# Patient Record
Sex: Male | Born: 1955 | Race: White | Hispanic: Yes | Marital: Married | Smoking: Current every day smoker
Health system: Southern US, Community
[De-identification: ages and names within clinical notes are randomized; demographics above are authoritative.]

## PROBLEM LIST (undated history)

## (undated) DIAGNOSIS — I10 Essential (primary) hypertension: Secondary | ICD-10-CM

## (undated) HISTORY — PX: OTHER SURGICAL HISTORY: SHX169

## (undated) HISTORY — PX: HEMORRHOID SURGERY: SHX153

---

## 2004-10-27 ENCOUNTER — Ambulatory Visit: Payer: Self-pay | Admitting: Internal Medicine

## 2004-10-28 ENCOUNTER — Ambulatory Visit: Payer: Self-pay | Admitting: Internal Medicine

## 2004-10-28 ENCOUNTER — Ambulatory Visit: Payer: Self-pay | Admitting: *Deleted

## 2005-01-30 ENCOUNTER — Ambulatory Visit: Payer: Self-pay | Admitting: Internal Medicine

## 2005-02-09 ENCOUNTER — Ambulatory Visit: Payer: Self-pay | Admitting: Internal Medicine

## 2005-04-14 ENCOUNTER — Ambulatory Visit: Payer: Self-pay | Admitting: Internal Medicine

## 2005-04-27 ENCOUNTER — Encounter: Admission: RE | Admit: 2005-04-27 | Discharge: 2005-04-27 | Payer: Self-pay | Admitting: Specialist

## 2005-06-29 ENCOUNTER — Ambulatory Visit: Payer: Self-pay | Admitting: Internal Medicine

## 2005-09-17 ENCOUNTER — Ambulatory Visit: Payer: Self-pay | Admitting: Internal Medicine

## 2005-09-23 ENCOUNTER — Ambulatory Visit: Payer: Self-pay | Admitting: Internal Medicine

## 2005-10-07 ENCOUNTER — Ambulatory Visit: Payer: Self-pay | Admitting: Internal Medicine

## 2008-01-04 ENCOUNTER — Ambulatory Visit: Payer: Self-pay | Admitting: Nurse Practitioner

## 2008-01-04 DIAGNOSIS — N400 Enlarged prostate without lower urinary tract symptoms: Secondary | ICD-10-CM

## 2008-01-04 DIAGNOSIS — I10 Essential (primary) hypertension: Secondary | ICD-10-CM

## 2008-01-04 DIAGNOSIS — F17200 Nicotine dependence, unspecified, uncomplicated: Secondary | ICD-10-CM | POA: Insufficient documentation

## 2008-01-04 LAB — CONVERTED CEMR LAB
Bilirubin Urine: NEGATIVE
Ketones, urine, test strip: NEGATIVE
Specific Gravity, Urine: 1.025
WBC Urine, dipstick: NEGATIVE

## 2008-01-05 ENCOUNTER — Encounter (INDEPENDENT_AMBULATORY_CARE_PROVIDER_SITE_OTHER): Payer: Self-pay | Admitting: Nurse Practitioner

## 2008-01-05 LAB — CONVERTED CEMR LAB
AST: 18 units/L (ref 0–37)
Albumin: 4.4 g/dL (ref 3.5–5.2)
BUN: 15 mg/dL (ref 6–23)
CO2: 24 meq/L (ref 19–32)
Eosinophils Relative: 2 % (ref 0–5)
Glucose, Bld: 85 mg/dL (ref 70–99)
HCT: 43.9 % (ref 39.0–52.0)
Lymphocytes Relative: 30 % (ref 12–46)
Lymphs Abs: 2.5 10*3/uL (ref 0.7–4.0)
Monocytes Absolute: 0.7 10*3/uL (ref 0.1–1.0)
Neutrophils Relative %: 60 % (ref 43–77)
PSA: 4.7 ng/mL — ABNORMAL HIGH (ref 0.10–4.00)
Potassium: 4.7 meq/L (ref 3.5–5.3)
RDW: 17.2 % — ABNORMAL HIGH (ref 11.5–15.5)
Sodium: 141 meq/L (ref 135–145)
TSH: 1.198 microintl units/mL (ref 0.350–5.50)
Total Protein: 7.2 g/dL (ref 6.0–8.3)
WBC: 8.6 10*3/uL (ref 4.0–10.5)

## 2008-02-01 ENCOUNTER — Encounter (INDEPENDENT_AMBULATORY_CARE_PROVIDER_SITE_OTHER): Payer: Self-pay | Admitting: Internal Medicine

## 2008-02-01 ENCOUNTER — Ambulatory Visit: Payer: Self-pay | Admitting: Nurse Practitioner

## 2008-02-01 LAB — CONVERTED CEMR LAB
Basophils Absolute: 0 10*3/uL (ref 0.0–0.1)
Basophils Relative: 0 % (ref 0–1)
Eosinophils Absolute: 0.3 10*3/uL (ref 0.0–0.7)
Eosinophils Relative: 3 % (ref 0–5)
Hemoglobin: 14.1 g/dL (ref 13.0–17.0)
MCHC: 32.2 g/dL (ref 30.0–36.0)
MCV: 83 fL (ref 78.0–100.0)

## 2008-02-09 ENCOUNTER — Emergency Department (HOSPITAL_COMMUNITY): Admission: EM | Admit: 2008-02-09 | Discharge: 2008-02-09 | Payer: Self-pay | Admitting: Emergency Medicine

## 2008-03-12 ENCOUNTER — Ambulatory Visit: Payer: Self-pay | Admitting: Nurse Practitioner

## 2008-03-13 ENCOUNTER — Encounter (INDEPENDENT_AMBULATORY_CARE_PROVIDER_SITE_OTHER): Payer: Self-pay | Admitting: Nurse Practitioner

## 2008-03-23 ENCOUNTER — Ambulatory Visit: Payer: Self-pay | Admitting: Nurse Practitioner

## 2008-03-23 DIAGNOSIS — N200 Calculus of kidney: Secondary | ICD-10-CM

## 2008-03-23 DIAGNOSIS — K6289 Other specified diseases of anus and rectum: Secondary | ICD-10-CM

## 2008-03-23 LAB — CONVERTED CEMR LAB
Glucose, Urine, Semiquant: NEGATIVE
Ketones, urine, test strip: NEGATIVE
Nitrite: NEGATIVE

## 2008-07-19 ENCOUNTER — Emergency Department (HOSPITAL_COMMUNITY): Admission: EM | Admit: 2008-07-19 | Discharge: 2008-07-19 | Payer: Self-pay | Admitting: Emergency Medicine

## 2010-08-01 ENCOUNTER — Ambulatory Visit: Payer: Self-pay | Admitting: Internal Medicine

## 2010-08-01 ENCOUNTER — Encounter (INDEPENDENT_AMBULATORY_CARE_PROVIDER_SITE_OTHER): Payer: Self-pay | Admitting: Nurse Practitioner

## 2010-08-01 DIAGNOSIS — E7800 Pure hypercholesterolemia, unspecified: Secondary | ICD-10-CM

## 2010-08-01 DIAGNOSIS — J309 Allergic rhinitis, unspecified: Secondary | ICD-10-CM | POA: Insufficient documentation

## 2010-08-01 DIAGNOSIS — D179 Benign lipomatous neoplasm, unspecified: Secondary | ICD-10-CM | POA: Insufficient documentation

## 2010-08-01 DIAGNOSIS — IMO0001 Reserved for inherently not codable concepts without codable children: Secondary | ICD-10-CM

## 2010-08-01 LAB — CONVERTED CEMR LAB
ALT: 14 units/L (ref 0–53)
Alkaline Phosphatase: 55 units/L (ref 39–117)
Calcium: 9.9 mg/dL (ref 8.4–10.5)
Chloride: 104 meq/L (ref 96–112)
Creatinine, Ser: 0.87 mg/dL (ref 0.40–1.50)
Eosinophils Absolute: 0.3 10*3/uL (ref 0.0–0.7)
Glucose, Bld: 81 mg/dL (ref 70–99)
Hemoglobin: 14.2 g/dL (ref 13.0–17.0)
LDL Goal: 130 mg/dL
Lymphocytes Relative: 37 % (ref 12–46)
Monocytes Absolute: 0.7 10*3/uL (ref 0.1–1.0)
Neutro Abs: 3.1 10*3/uL (ref 1.7–7.7)
Neutrophils Relative %: 47 % (ref 43–77)
Potassium: 4.4 meq/L (ref 3.5–5.3)
RBC: 5.27 M/uL (ref 4.22–5.81)
RDW: 16.6 % — ABNORMAL HIGH (ref 11.5–15.5)
Sodium: 139 meq/L (ref 135–145)
Total Bilirubin: 0.6 mg/dL (ref 0.3–1.2)
WBC: 6.6 10*3/uL (ref 4.0–10.5)

## 2010-08-04 ENCOUNTER — Encounter (INDEPENDENT_AMBULATORY_CARE_PROVIDER_SITE_OTHER): Payer: Self-pay | Admitting: Nurse Practitioner

## 2010-08-06 ENCOUNTER — Ambulatory Visit: Payer: Self-pay | Admitting: Nurse Practitioner

## 2010-08-07 LAB — CONVERTED CEMR LAB: Triglycerides: 216 mg/dL — ABNORMAL HIGH (ref <150)

## 2010-08-15 ENCOUNTER — Telehealth (INDEPENDENT_AMBULATORY_CARE_PROVIDER_SITE_OTHER): Payer: Self-pay | Admitting: Nurse Practitioner

## 2010-08-19 ENCOUNTER — Telehealth (INDEPENDENT_AMBULATORY_CARE_PROVIDER_SITE_OTHER): Payer: Self-pay | Admitting: Nurse Practitioner

## 2010-09-04 ENCOUNTER — Telehealth (INDEPENDENT_AMBULATORY_CARE_PROVIDER_SITE_OTHER): Payer: Self-pay | Admitting: Nurse Practitioner

## 2010-12-09 NOTE — Progress Notes (Signed)
Summary: LOVASTATIN Rx  Phone Note Call from Patient   Summary of Call: SPOKE WITH PT AND HE CAME TO THE OFFICE THIS AFTERNOON BECAUSE HE WAS TOLD LOVASTATIN 20 MG WAS SENT TO THE [place] [facility].  HE HAS WENT TO PICK UP MEDICATION AND WAS TOLD THERE WAS NOT RX THERE FOR HIM.  I CALLED TO THE PHARMACY AND THEY SAID THAT THEY DO NOT HAVE AN RX FOR HIM FOR THE LOVASTATIN AND THAT THEY DON'T HAVE THAT IF HE WAS TO GET THE RX FOR LOVASTATIN THEN THEY WOULD GIVE HIM PRAVASTIN.  I TOLD PT TO CALL TO THE OFFICE IN THE MORNING SO THAT WE CAN GET HIS MEDICATION FOR HIM SENT TO THE PHARMACY OR HE CAN COME BY AND PICK UP MEDICATION. Initial call taken by: Levon Hedger,  August 19, 2010 4:46 PM  Follow-up for Phone Call        Sorry for the confusion - note from 08/15/2010 indicated that rx was sent to [facility] pharmacy i have sent pravastatin 20mg  by mouth nightly to the [facility] pharmacy electronically. Advise pt to check there by Friday again apologize for the confusion.  I appreciate his willingness to be complaint Follow-up by: Lehman Prom FNP,  August 19, 2010 6:03 PM  Additional Follow-up for Phone Call Additional follow up Details #1::        pt informed of above information. Additional Follow-up by: Levon Hedger,  August 25, 2010 4:05 PM    New/Updated Medications: PRAVASTATIN SODIUM 20 MG TABS (PRAVASTATIN SODIUM) One tablet by mouth nightly for cholesterol Prescriptions: PRAVASTATIN SODIUM 20 MG TABS (PRAVASTATIN SODIUM) One tablet by mouth nightly for cholesterol  #30 x 5   Entered and Authorized by:   Lehman Prom FNP   Signed by:   Lehman Prom FNP on 08/19/2010   Method used:   Faxed to ...       [facility] (retail)       [address] [place], [place]  [address]       Ph: [phone]       Fax: [phone]   RxID:   [id number]

## 2010-12-09 NOTE — Assessment & Plan Note (Signed)
Summary: HTN   Vital Signs:  Patient profile:   55 year old male Height:      70 inches Weight:      214.0 pounds BMI:     30.82 Temp:     97.4 degrees F oral Pulse rate:   66 / minute Pulse rhythm:   regular Resp:     16 per minute BP sitting:   124 / 84  (left arm) Cuff size:   large  Vitals Entered By: Levon Hedger (August 01, 2010 11:46 AM)  Nutrition Counseling: Patient's BMI is greater than 25 and therefore counseled on weight management options. CC: wants to have cholesterol checked...pain in muscle and joints alot....allergies are very active right now, Hypertension Management, Lipid Management Is Patient Diabetic? No Pain Assessment Patient in pain? no       Does patient need assistance? Functional Status Self care Ambulation Normal Comments pt has eaten this morning.   CC:  wants to have cholesterol checked...pain in muscle and joints alot....allergies are very active right now, Hypertension Management, and Lipid Management.  History of Present Illness:  Pt into the office for f/u - last visit over 1 year ago. Pt has been in [place] for the past 6 months  Hypertension History:      He denies headache, chest pain, and palpitations.  He notes no problems with any antihypertensive medication side effects.  Pt has been taking losartan which he got from his country.        Positive major cardiovascular risk factors include male age 23 years old or older, hyperlipidemia, and hypertension.  Negative major cardiovascular risk factors include no history of diabetes and non-tobacco-user status.        Further assessment for target organ damage reveals no history of ASHD, cardiac end-organ damage (CHF/LVH), stroke/TIA, peripheral vascular disease, renal insufficiency, or hypertensive retinopathy.    Lipid Management History:      Positive NCEP/ATP III risk factors include male age 71 years old or older and hypertension.  Negative NCEP/ATP III risk factors  include non-diabetic, non-tobacco-user status, no ASHD (atherosclerotic heart disease), no prior stroke/TIA, and no peripheral vascular disease.        Comments include: Pt was given both simvastatin and lovastatin in [place]. Simvastatin 10mg  was not effective and then he was changed to lovastatin.      Habits & Providers  Alcohol-Tobacco-Diet     Alcohol drinks/day: 0     Tobacco Status: quit     Year Quit: march 2009  Exercise-Depression-Behavior     Have you felt down or hopeless? no     Have you felt little pleasure in things? no     Depression Counseling: not indicated; screening negative for depression     Drug Use: no     Seat Belt Use: 100     Sun Exposure: occasionally  Allergies (verified): No Known Drug Allergies  Review of Systems General:  Denies fever. ENT:  Complains of nasal congestion; denies earache and sore throat; Pt has tried afrin but it is not effective.. CV:  Denies chest pain or discomfort. Resp:  Denies cough. 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 Allergy:  Complains of seasonal allergies and sneezing.  Physical Exam  General:  alert.   Head:  normocephalic.   Lungs:  normal breath sounds.   Heart:  normal rate and  regular rhythm.   Abdomen:  normal bowel sounds.   Msk:  up to the exam table Neurologic:  alert & oriented X3.     Impression & Recommendations:  Problem # 1:  HYPERTENSION, BENIGN ESSENTIAL (ICD-401.1)  BP is doing better The following medications were removed from the medication list:    Lisinopril 40 Mg Tabs (Lisinopril) .Marland Kitchen... 1 tablet by mouth daily for blood pressure His updated medication list for this problem includes:    Losartan Potassium 100 Mg Tabs (Losartan potassium) ..... One tablet by mouth daily for blood pressure  Orders: T-Comprehensive Metabolic Panel ([id number]) T-CBC w/Diff ([id number]) T-TSH  ([id number])  Problem # 2:  MUSCLE PAIN (ICD-729.1)  His updated medication list for this problem includes:    Tramadol Hcl 50 Mg Tabs (Tramadol hcl) ..... One tablet by mouth two times a day as needed for pain  Problem # 3:  HYPERCHOLESTEROLEMIA (ICD-272.0) pt to return for labs Orders: T-Comprehensive Metabolic Panel ([id number])  Problem # 4:  ALLERGIC RHINITIS (ICD-477.9)  His updated medication list for this problem includes:    Veramyst 27.5 Mcg/spray Susp (Fluticasone furoate) ..... One spray in each nostril two times a day  Complete Medication List: 1)  Losartan Potassium 100 Mg Tabs (Losartan potassium) .... One tablet by mouth daily for blood pressure 2)  Tramadol Hcl 50 Mg Tabs (Tramadol hcl) .... One tablet by mouth two times a day as needed for pain 3)  Veramyst 27.5 Mcg/spray Susp (Fluticasone furoate) .... One spray in each nostril two times a day  Hypertension Assessment/Plan:      The patient's hypertensive risk group is category B: At least one risk factor (excluding diabetes) with no target organ damage.  Today's blood pressure is 124/84.  His blood pressure goal is < 140/90.  Lipid Assessment/Plan:      Based on NCEP/ATP III, the patient's risk factor category is "2 or more risk factors and a calculated 10 year CAD risk of > 20%".  The patient's lipid goals are as follows: Total cholesterol goal is 200; LDL cholesterol goal is 130; HDL cholesterol goal is 40; Triglyceride goal is 150.    Patient Instructions: 1)  Schedule a lab visit for next visit - lipids 2)  do not eat after midnight before this visit. 3)  Use the nose spray for congestion.  Spray in each nostril two times a day (hold head down) Prescriptions: VERAMYST 27.5 MCG/SPRAY SUSP (FLUTICASONE FUROATE) one spray in each nostril two times a day  #1 x 0   Entered and Authorized by:   Lehman Prom FNP   Signed by:   Lehman Prom FNP on 08/01/2010   Method used:   Samples Given   RxID:    [id number] TRAMADOL HCL 50 MG TABS (TRAMADOL HCL) One tablet by mouth two times a day as needed for pain  #50 x 0   Entered and Authorized by:   Lehman Prom FNP   Signed by:   Lehman Prom FNP on 08/01/2010   Method used:   Print then Give to Patient   RxID:   [id number] NATFTDDU POTASSIUM 100 MG TABS (LOSARTAN POTASSIUM) One tablet by mouth daily for blood pressure  #30 x 5   Entered and Authorized by:   Lehman Prom FNP   Signed by:   Lehman Prom FNP on 08/01/2010   Method used:   Print then Give to Patient   RxID:   [id number]

## 2010-12-09 NOTE — Progress Notes (Signed)
Summary: Wants change of medication  Phone Note Call from Patient Call back at [phone]   Summary of Call: PATIENT WANT TO CHANGE MEDICINE FOR CHOLESTEROL, MEDICINE MAKING HIM FEEL TIRED WEAK. Initial call taken by: Domenic Polite,  September 04, 2010 11:57 AM  Follow-up for Phone Call        Doesn't feel well taking this.  Has been feeling muscle pain, fatigued, weak for about eight days.  Also having some stomach discomfort.   Taking med every night with food.  Denies any other symptoms.  Dutch Quint RN  September 04, 2010 2:24 PM   Additional Follow-up for Phone Call Additional follow up Details #1::        Taking WITH food is the problem he needs to take this cholesterol medicaton 2 hours AFTER eating Advise pt to STOP med for 5 days THEN restart at night with above directions advise him to take just before bedtime Additional Follow-up by: Lehman Prom FNP,  September 04, 2010 3:35 PM    Additional Follow-up for Phone Call Additional follow up Details #2::    Pt. advised of provider's instructions -- verbalized understanding and agreement.  Dutch Quint RN  September 04, 2010 4:13 PM

## 2010-12-09 NOTE — Letter (Signed)
Summary: *HSN Results Follow up  [facility]  [address] [place], [place] [address]   Phone: [phone]  Fax: [phone]      08/04/2010   Danny Murillo [address] [place], [place]  [address]   Dear  [name],                            ____S.Drinkard,FNP   ____D. Gore,FNP       ____B. McPherson,MD   ____V. Rankins,MD    ____E. Mulberry,MD    __X__N. Daphine Deutscher, FNP  ____D. Reche Dixon, MD    ____K. Philipp Deputy, MD    ____Other     This letter is to inform you that your recent test(s):  _______Pap Smear    ___X____Lab Test     _______X-ray    ___X____ is within acceptable limits  _______ requires a medication change  _______ requires a follow-up lab visit  _______ requires a follow-up visit with your provider   Comments:  Labs done during recent office visit are normal.       _________________________________________________________ If you have any questions, please contact our office [phone].                    Sincerely,   Lehman Prom FNP [facility]

## 2010-12-09 NOTE — Letter (Signed)
Summary: Handout Printed  Printed Handout:  - Lipoma (Fatty Tumor) 

## 2010-12-09 NOTE — Progress Notes (Signed)
Summary: RX FOR CHOLESTEROL  Phone Note Call from Patient Call back at [phone]   Summary of Call: PT NEED HES RX FOR CHOLESTEROL HE WLL CAME AND PICK UP THE RX Initial call taken by: Domenic Polite,  August 15, 2010 10:40 AM  Follow-up for Phone Call        I don't see a Rx for him in the drawer.   Dutch Quint RN  August 15, 2010 11:11 AM   Additional Follow-up for Phone Call Additional follow up Details #1::        see previous phone note - looks like he was contacted about RX if he wants it to go to another pharmacy then find out where and call it in there according to recent Rx on 08/07/2010 Additional Follow-up by: Lehman Prom FNP,  August 15, 2010 11:50 AM    Additional Follow-up for Phone Call Additional follow up Details #2::    Pt. notified that Rx was sent to [facility] -- verbalized understanding.  No change needed.  Dutch Quint RN  August 15, 2010 12:25 PM

## 2011-08-04 LAB — CBC
Hemoglobin: 14.7
Platelets: 294
RDW: 15.4
WBC: 13 — ABNORMAL HIGH

## 2011-08-04 LAB — DIFFERENTIAL
Basophils Absolute: 0.1
Basophils Relative: 1
Monocytes Absolute: 1.3 — ABNORMAL HIGH
Monocytes Relative: 10
Neutro Abs: 9.3 — ABNORMAL HIGH
Neutrophils Relative %: 71

## 2011-08-04 LAB — BASIC METABOLIC PANEL
BUN: 15
CO2: 23
Chloride: 104
Creatinine, Ser: 1.39
Glucose, Bld: 94
Potassium: 3.7

## 2011-08-04 LAB — URINALYSIS, ROUTINE W REFLEX MICROSCOPIC
Specific Gravity, Urine: 1.025
Urobilinogen, UA: 0.2
pH: 6.5

## 2011-08-04 LAB — URINE CULTURE

## 2011-08-04 LAB — URINE MICROSCOPIC-ADD ON

## 2015-02-28 ENCOUNTER — Emergency Department (HOSPITAL_COMMUNITY)
Admission: EM | Admit: 2015-02-28 | Discharge: 2015-03-01 | Disposition: A | Discharge status: Home / Self Care | Payer: Self-pay | Attending: Emergency Medicine | Admitting: Emergency Medicine

## 2015-02-28 ENCOUNTER — Encounter (HOSPITAL_COMMUNITY): Payer: Self-pay | Admitting: Emergency Medicine

## 2015-02-28 DIAGNOSIS — R509 Fever, unspecified: Secondary | ICD-10-CM | POA: Insufficient documentation

## 2015-02-28 DIAGNOSIS — I10 Essential (primary) hypertension: Secondary | ICD-10-CM | POA: Insufficient documentation

## 2015-02-28 DIAGNOSIS — M791 Myalgia, unspecified site: Secondary | ICD-10-CM

## 2015-02-28 DIAGNOSIS — Z720 Tobacco use: Secondary | ICD-10-CM | POA: Insufficient documentation

## 2015-02-28 DIAGNOSIS — R110 Nausea: Secondary | ICD-10-CM | POA: Insufficient documentation

## 2015-02-28 DIAGNOSIS — R5383 Other fatigue: Secondary | ICD-10-CM | POA: Insufficient documentation

## 2015-02-28 HISTORY — DX: Essential (primary) hypertension: I10

## 2015-02-28 LAB — COMPREHENSIVE METABOLIC PANEL
ALBUMIN: 3.9 g/dL (ref 3.5–5.2)
ALT: 31 U/L (ref 0–53)
ANION GAP: 8 (ref 5–15)
AST: 25 U/L (ref 0–37)
Alkaline Phosphatase: 80 U/L (ref 39–117)
BUN: 13 mg/dL (ref 6–23)
CALCIUM: 9.5 mg/dL (ref 8.4–10.5)
CO2: 25 mmol/L (ref 19–32)
Chloride: 104 mmol/L (ref 96–112)
Creatinine, Ser: 1.16 mg/dL (ref 0.50–1.35)
GFR, EST AFRICAN AMERICAN: 80 mL/min — AB (ref >90)
GFR, EST NON AFRICAN AMERICAN: 69 mL/min — AB (ref >90)
Glucose, Bld: 113 mg/dL — ABNORMAL HIGH (ref 70–99)
Potassium: 3.8 mmol/L (ref 3.5–5.1)
SODIUM: 137 mmol/L (ref 135–145)
TOTAL PROTEIN: 7.1 g/dL (ref 6.0–8.3)
Total Bilirubin: 0.7 mg/dL (ref 0.3–1.2)

## 2015-02-28 LAB — CBC WITH DIFFERENTIAL/PLATELET
BASOS ABS: 0.1 10*3/uL (ref 0.0–0.1)
BASOS PCT: 1 % (ref 0–1)
Eosinophils Absolute: 0.4 10*3/uL (ref 0.0–0.7)
Eosinophils Relative: 5 % (ref 0–5)
HCT: 44 % (ref 39.0–52.0)
Hemoglobin: 14.9 g/dL (ref 13.0–17.0)
Lymphocytes Relative: 38 % (ref 12–46)
Lymphs Abs: 3.4 10*3/uL (ref 0.7–4.0)
MCH: 27.5 pg (ref 26.0–34.0)
MCHC: 33.9 g/dL (ref 30.0–36.0)
MCV: 81.2 fL (ref 78.0–100.0)
MONO ABS: 1 10*3/uL (ref 0.1–1.0)
Monocytes Relative: 11 % (ref 3–12)
NEUTROS ABS: 4 10*3/uL (ref 1.7–7.7)
NEUTROS PCT: 45 % (ref 43–77)
PLATELETS: 329 10*3/uL (ref 150–400)
RBC: 5.42 MIL/uL (ref 4.22–5.81)
RDW: 15.5 % (ref 11.5–15.5)
WBC: 8.8 10*3/uL (ref 4.0–10.5)

## 2015-02-28 NOTE — ED Notes (Signed)
Pt. reports fever , generalized body aches , fatigue and nausea onset yesterday , denies cough or congestion , respirations unlabored .

## 2015-02-28 NOTE — ED Notes (Signed)
Called pt twice to be placed in room with no answer.

## 2015-02-28 NOTE — ED Notes (Signed)
Called pt one more time for room placement with no answer.

## 2015-03-01 MED ORDER — TRAMADOL HCL 50 MG PO TABS: 50.0000 mg | ORAL_TABLET | Freq: Four times a day (QID) | ORAL | Status: AC | PRN

## 2015-03-01 MED ORDER — TRAMADOL HCL 50 MG PO TABS
50.0000 mg | ORAL_TABLET | Freq: Once | ORAL | Status: AC
  Administered 2015-03-01: 50 mg via ORAL
  Filled 2015-03-01: qty 1

## 2015-03-01 NOTE — ED Provider Notes (Signed)
CSN: [id number]     Arrival date & time 02/28/15  2232 History  This chart was scribed for Linton Flemings, MD by Rayfield Citizen, ED Scribe. This patient was seen in room D33C/D33C and the patient's care was started at 12:40 AM.    Chief Complaint  Patient presents with  . Generalized Body Aches   The history is provided by the patient. No language interpreter was used.     HPI Comments: Danny Murillo is a 60 y.o. male with past medical history of HTN (managed with medication) who presents to the Emergency Department complaining of 8 days of subjective fever, nausea, fatigue and generalized myalgias. Patient reports that his symptoms worsen in the afternoons. He explains that he "always has whole body pain, for the past ten years," but recent symptoms are worse than his baseline; this pain is in his hands, shoulders, knees, back. He denies vomiting, diarrhea, abdominal cramping, sore throat, rhinorrhea, recent exposure to ticks.   Patient does not have a PCP in the area at this time. His [place]CP in [place] previous country [place] residence [place] believes he may have fibromyalgia. He has never been placed on medication for this issue. Prior surgical history includes spleenectomy; patient states his "platelets were too high."   Past Medical History  Diagnosis Date  . Hypertension    Past Surgical History  Procedure Laterality Date  . Spleenectomy    . Hemorrhoid surgery     No family history on file. History  Substance Use Topics  . Smoking status: Current Every Day Smoker  . Smokeless tobacco: Not on file  . Alcohol Use: No    Review of Systems  Constitutional: Positive for fever (Subjective) and fatigue.  HENT: Negative for rhinorrhea and sore throat.   Gastrointestinal: Positive for nausea. Negative for vomiting, abdominal pain and diarrhea.  All other systems reviewed and are negative.   Allergies  Review of patient's allergies indicates no known allergies.  Home  Medications   Prior to Admission medications   Medication Sig Start Date End Date Taking? Authorizing Provider  traMADol (ULTRAM) 50 MG tablet Take 1 tablet (50 mg total) by mouth every 6 (six) hours as needed. 03/01/15   Linton Flemings, MD   BP 148/93 mmHg  Pulse 89  Temp(Src) 98.7 F (37.1 C)  Resp 18  SpO2 98% Physical Exam  Constitutional: He is oriented to person, place, and time. He appears well-developed and well-nourished. No distress.  HENT:  Head: Normocephalic and atraumatic.  Mouth/Throat: Oropharynx is clear and moist. No oropharyngeal exudate.  Moist mucous membranes  Eyes: EOM are normal. Pupils are equal, round, and reactive to light.  Neck: Normal range of motion. Neck supple. No JVD present.  Cardiovascular: Normal rate, regular rhythm and normal heart sounds.  Exam reveals no gallop and no friction rub.   No murmur heard. Pulmonary/Chest: Effort normal and breath sounds normal. No respiratory distress. He has no wheezes. He has no rales.  Abdominal: Soft. Bowel sounds are normal. He exhibits no mass. There is no tenderness. There is no rebound and no guarding.  Musculoskeletal: Normal range of motion. He exhibits no edema.  Moves all extremities normally.   Lymphadenopathy:    He has no cervical adenopathy.  Neurological: He is alert and oriented to person, place, and time. He displays normal reflexes.  Skin: Skin is warm and dry. No rash noted.  Psychiatric: He has a normal mood and affect. His behavior is normal.  Nursing note and  vitals reviewed.   ED Course  Procedures   DIAGNOSTIC STUDIES: Oxygen Saturation is 100% on RA, normal by my interpretation.    COORDINATION OF CARE: 12:47 AM Discussed treatment plan with pt at bedside and pt agreed to plan.   Labs Review Labs Reviewed  COMPREHENSIVE METABOLIC PANEL - Abnormal; Notable for the following:    Glucose, Bld 113 (*)    GFR calc non Af Amer 69 (*)    GFR calc Af Amer 80 (*)    All other  components within normal limits  CBC WITH DIFFERENTIAL/PLATELET    Imaging Review No results found.   EKG Interpretation None      MDM   Final diagnoses:  Myalgia    I personally performed the services described in this documentation, which was scribed in my presence. The recorded information has been reviewed and is accurate.  59 year old male with subjective fever for the last 8 days and acute on chronic diffuse body aches.  Labs and physical exam today unremarkable.  Will set patient up with outpatient resources list, and tramadol for pain     Linton Flemings, MD 03/01/15 [phone]

## 2015-03-01 NOTE — Discharge Instructions (Signed)
Buy a thermometer and check your temperature when you are feeling hot.  Keep a log of your temperatures.  Use the list below to establish care with a local primary care doctor for further workup of your ongoing symptoms.  Return to the ER for worsening condition or new concerning symptoms.   Dolor muscular (Muscle Pain) Las causas del dolor muscular (mialgia) pueden ser [place], entre ellas:  Uso excesivo del msculo o distensin muscular, en especial si la persona no est en buen estado fsico. Esta es la causa ms comn del dolor muscular.  Lesiones.  Moretones.  Virus, como el de la gripe.  Enfermedades infecciosas.  Fibromialgia, que es una afeccin crnica que causa dolor de [place], fatiga y dolor muscular con la palpacin.  Enfermedades autoinmunes, como el lupus.  Determinados medicamentos, como los inhibidores de la ECA y las estatinas. El dolor muscular puede ser leve o intenso. La mayora de las veces, el dolor dura solo un corto perodo y desaparece sin tratamiento. Para diagnosticar la causa del dolor muscular, el mdico le preguntar los antecedentes mdicos. Esto significa que le preguntar cundo comenz Physiological scientist y qu ha sucedido desde ese momento. Si el dolor muscular no comenz hace mucho tiempo, el mdico puede esperar antes de hacer diversas pruebas. Si el dolor muscular comenz hace mucho tiempo, el mdico puede decidir que es ms conveniente hacer pruebas de inmediato. Si el mdico cree que Physiological scientist puede estar causado por una enfermedad, es posible que necesite hacer pruebas adicionales para descartar determinadas afecciones.  El tratamiento del dolor muscular depende de la causa. El cuidado en el hogar a menudo es suficiente para Stage manager. El mdico tambin puede recetarle un medicamento antinflamatorio. INSTRUCCIONES PARA EL CUIDADO EN EL HOGAR Controle su afeccin para ver si hay cambios. Las siguientes indicaciones ayudarn a  Chief Strategy Officer que pueda sentir:  [employer] medicamentos de venta libre o recetados solamente segn las indicaciones del mdico.  Aplique hielo en el msculo dolorido:  Ponga el hielo en una bolsa plstica.  Colquese una toalla entre la piel y la bolsa de hielo.  Aplique el hielo de 3 a 4 veces por da durante 15 a 59minutos.  Puede alternar la colocacin de compresas calientes y fras en el msculo segn lo que le haya indicado el mdico.  Si el uso excesivo del msculo le est causando dolor muscular, disminuya las actividades hasta que el dolor desaparezca.  Recuerde que es normal sentir algo de dolor muscular despus de comenzar un programa de entrenamiento. Generalmente duelen aquellos msculos que no se utilizan con frecuencia.  Si usted no hace actividad fsica con frecuencia, los ejercicios deben ser suaves y regulares.  Para reducir el riesgo de dolor muscular, haga un precalentamiento antes de la actividad fsica.  No siga haciendo actividad fsica si el dolor es muy intenso. Este tipo de dolor podra indicar que se ha lesionado un msculo. SOLICITE ATENCIN MDICA SI:  El Marketing executive empeora y los medicamentos no surten [place].  Tiene dolor muscular que dura ms de 3das.  Tiene una erupcin cutnea o fiebre junto con el dolor muscular.  Tiene dolor muscular despus de una picadura de garrapata.  Tiene dolor muscular mientras hace actividad fsica, aunque est en buen estado fsico.  Tiene enrojecimiento, sensibilidad o hinchazn junto con el dolor muscular.  Tiene dolor muscular despus de comenzar un medicamento nuevo o de cambiar la dosis de un medicamento. [place]  SI:  Tiene dificultad para respirar.  Presenta dificultad para tragar.  Tiene dolor muscular junto con rigidez en el cuello, fiebre y vmitos.  Tiene debilidad muscular intensa o no puede mover una parte del cuerpo. ASEGRESE DE QUE:   Comprende  estas instrucciones.  Controlar su afeccin.  Recibir ayuda de inmediato si no mejora o si empeora. Document Released: 02/02/2008 Document Revised: 10/31/2013 [facility] Patient Information 2015 [place]. This information is not intended to replace advice given to you by your health care provider. Make sure you discuss any questions you have with your health care provider.   Emergency Department Resource Guide 1) Find a Doctor and Pay Out of Pocket Although you won't have to find out who is covered by your insurance plan, it is a good idea to ask around and get recommendations. You will then need to call the office and see if the doctor you have chosen will accept you as a new patient and what types of options they offer for patients who are self-pay. Some doctors offer discounts or will set up payment plans for their patients who do not have insurance, but you will need to ask so you aren't surprised when you get to your appointment.  2) Contact Your Local Health Department Not all health departments have doctors that can see patients for sick visits, but many do, so it is worth a call to see if yours does. If you don't know where your local health department is, you can check in your phone book. The CDC also has a tool to help you locate your state's health department, and many state websites also have listings of all of their local health departments.  3) Find a [facility] If your illness is not likely to be very severe or complicated, you may want to try a walk in clinic. These are popping up all over the country in pharmacies, drugstores, and shopping centers. They're usually staffed by nurse practitioners or physician assistants that have been trained to treat common illnesses and complaints. They're usually fairly quick and inexpensive. However, if you have serious medical issues or chronic medical problems, these are probably not your best option.  No Primary Care  Doctor: - Call Health Connect at  [phone] - they can help you locate a primary care doctor that  accepts your insurance, provides certain services, etc. - Physician Referral Service- [phone]  Chronic Pain Problems: Organization         Address  Phone   Notes  [facility]  [phone] Patients need to be referred by their primary care doctor.   Medication Assistance: Organization         Address  Phone   Notes  [facility] Medication [facility] [address] [place], [place] [address] [phone] --Must be a resident of [facility] -- Must have NO insurance coverage whatsoever (no Medicaid/ Medicare, etc.) -- The pt. MUST have a primary care doctor that directs their care regularly and follows them in the community   MedAssist  [phone]   [employer]  [phone]    Agencies that provide inexpensive medical care: Organization         Address  Phone   Notes  [place]  [phone]   [facility]    [phone]   [facility] [place], [place] [address] [phone]   [place]  [address], [place] [phone]   Planned Parenthood    [phone]   [facility]    [phone]   [place] and [place] [address], [place] Phone:  [phone], Fax:  [phone] Hours of Operation:  9 am - 6 pm, M-F.  Also accepts Medicaid/Medicare and self-pay.  [facility] for [address], [address], [place] Phone: [phone], Fax: [phone]. Hours of Operation:  8:30 am - 5:30 pm, M-F.  Also accepts Medicaid and self-pay.  [facility] [address] [place] Phone: [phone]   [place], [place], [place] [phone], Ext. [phone] Mondays & Thursdays: 7-9 AM.  First 15 patients are seen on a first  come, first serve basis.    [place] Providers:  Organization         Address  Phone   Notes  [facility] [address], [place] [phone] Also accepts self-pay patients.  [facility] [address], [place]  [phone]   [place], [address], [place] [phone]   [facility] [address], [place] [phone]   Lucianne Lei [address], [place]   [phone] Only accepts [place] Access [place] patients after they have their name applied to their card.   Self-Pay (no insurance) in [facility]:  Organization         Address  Phone   Notes  Sickle Cell Patients, [facility] [place] [phone]   [facility] [place] [phone]   [facility] [place]  [place], [place], [place] [phone]   [facility]/Dr. Osei-Bonsu  [address] [place] or [address], [place] [phone] Phone number for both [place] and [place] locations is the same.  [facility] [address], [place] [phone]   [facility] [address], [place] or [address] [phone]   [facility] [address] [place] [phone], phone; [phone], fax Sees patients 1st and 3rd Saturday of every month.  Must not qualify for public or private insurance (i.e. Medicaid, Medicare, [place] Health Choice, Veterans' Benefits)  Household income should be no more than 200% of the poverty level The clinic cannot treat you if you are pregnant or think you are pregnant  Sexually transmitted diseases are not treated at the clinic.    Dental Care: Organization          Address  Phone  Notes  [facility] Department of [facility] [phone] Accepts children up to age 64 who are enrolled in [place] or [place]; pregnant women with a Medicaid card; and children who have applied for Medicaid or [place] Health Choice, but were declined, whose parents can pay a reduced fee at time of service.  [facility] Department of [facility]  [address], [place] [phone] Accepts children up to age 64 who are enrolled in [place] or [place]; pregnant women with a Medicaid card; and children who have applied  for Medicaid or [place] Health Choice, but were declined, whose parents can pay a reduced fee at time of service.  [place] Adult Dental Access PROGRAM  [place] [phone] Patients are seen by appointment only. Walk-ins are not accepted. [place] will see patients 16 years of age and older. Monday - Tuesday (8am-5pm) Most Wednesdays (8:30-5pm) $30 per visit, cash only  [facility] Adult Dental Access PROGRAM  [address], [facility] [phone] Patients are seen by appointment only. Walk-ins are not accepted. [place] will see patients 26 years of age and older. One Wednesday Evening (Monthly: Volunteer Based).  $30 per visit, cash only  [place]  [phone] for adults; Children under age 53, call Graduate Pediatric Dentistry at [phone]. Children aged 4-14, please call [phone] to request a pediatric application.  Dental services are provided in all areas of dental care including fillings, crowns and bridges, complete and partial dentures, implants, gum treatment, root canals, and extractions. Preventive care is also provided. Treatment is provided to both adults and children. Patients are selected via a lottery and there is often a waiting list.   [facility] [address] [place]  [phone] [email or web address]   [facility] [address] [place], [place] [phone], Ext. [phone] Second and Fourth Thursday of each month, opens at 6:30 AM; Clinic ends at 9 AM.  Patients are seen on a first-come first-served basis, and a limited number are seen during each clinic.   [facility]  [address] Hillard Danker [place], [place] [phone]   Eligibility Requirements You must have lived in [place], [place], or [place] counties for at least the last three months.   You cannot be eligible for state or federal sponsored [employer], including [employer], [place], or [employer].   You generally cannot be eligible for healthcare insurance through your employer.    How to apply: Eligibility screenings are held every Tuesday and Wednesday afternoon from 1:00 pm until 4:00 pm. You do not need an appointment for the interview!  [facility] [address] [place], [place]   [place]  [place] Department  [place]  [phone]    Behavioral Health Resources in the Community: Intensive Outpatient Programs Organization         Address  Phone  Notes  [place] [address], [place], [place] [phone]   [facility] [address] [place], [place]   ADS: Alcohol & Drug Svcs [address] [place], [place]   [place] [address]  [place], [place] or [phone]   Substance Abuse Resources Organization         Address  Phone  Notes  Alcohol and Drug Services  [phone]   [place]  [phone]   The Media   Chinita Pester  [phone]   Residential & Outpatient Substance Abuse Program  [phone]   Psychological  Services Organization         Address  Phone  Notes  [facility]  [place]  [phone]   [place] [address] [place] or [phone]    Mobile Crisis Teams Organization  Address  Phone  Notes  [facility], Mobile Crisis Care Unit  [phone]   [facility]  [address] [place], [place]   [facility] [address] [place] [phone]    Self-Help/Support Groups Organization         Address  Phone             Notes  [place]. of [place] - variety of support groups  [place] Call for more information  Narcotics Anonymous (NA), [facility] [address], [employer] [place]  2 meetings at this location   Special educational needs teacher         Address  Phone  Notes  ASAP Residential Treatment [place],    [place]  [phone]   [facility]  [address], [place] [phone], [place], [place]   [place], [place] [phone] Admissions: 8am-3pm M-F  Incentives Substance [place] [address],    [place], [place] [phone]   [facility] [address] [place], [place], [place]   [facility] [address],  [place], [place]   [facility] Programs - Intensive Outpatient [address] Kristeen [address], [place], [place]   [facility] [place]  [place], [place] [phone] or [phone]   [facility] Services (RTS) [address] [place], [place] Accepts Medicaid  [facility] [place] [address],  [place] [phone] Substance Abuse/Addiction Treatment   [facility] Organization         Address  Phone  Notes  [facility]  [phone]   Domenic Schwab, PhD [address] Arlis Porta [place], [place]   [phone] or [phone]   [place], [place] [phone]   [facility] [address] [place], [place] [phone] Insurance/Medicaid/sponsorship through [facility] [address] [place]                                    [place], [place] [phone] [place] [address], [place] [phone]    Dr. Adele Schilder  [phone]   [facility] [place] Dept. 1) [address] [place] 2) [place] 3)  [place] [address], [place] [phone]  [phone]   [place] [phone] or [phone] (After Hours)

## 2016-03-12 DIAGNOSIS — E663 Overweight: Secondary | ICD-10-CM

## 2016-03-12 DIAGNOSIS — R030 Elevated blood-pressure reading, without diagnosis of hypertension: Secondary | ICD-10-CM

## 2016-03-12 DIAGNOSIS — Z6836 Body mass index (BMI) 36.0-36.9, adult: Secondary | ICD-10-CM

## 2016-03-12 DIAGNOSIS — E6609 Other obesity due to excess calories: Secondary | ICD-10-CM

## 2016-03-12 DIAGNOSIS — Z6831 Body mass index (BMI) 31.0-31.9, adult: Secondary | ICD-10-CM

## 2016-03-23 DIAGNOSIS — R7302 Impaired glucose tolerance (oral): Secondary | ICD-10-CM

## 2016-05-07 DIAGNOSIS — R7989 Other specified abnormal findings of blood chemistry: Secondary | ICD-10-CM

## 2016-10-22 DIAGNOSIS — E782 Mixed hyperlipidemia: Secondary | ICD-10-CM

## 2017-02-10 ENCOUNTER — Ambulatory Visit: Payer: MEDICARE

## 2017-02-10 ENCOUNTER — Ambulatory Visit: Payer: MEDICARE | Attending: Rheumatology

## 2017-02-10 DIAGNOSIS — M170 Bilateral primary osteoarthritis of knee: Secondary | ICD-10-CM

## 2017-02-17 ENCOUNTER — Ambulatory Visit: Payer: MEDICARE

## 2017-02-17 DIAGNOSIS — B078 Other viral warts: Secondary | ICD-10-CM

## 2017-02-17 DIAGNOSIS — L700 Acne vulgaris: Secondary | ICD-10-CM

## 2017-02-25 ENCOUNTER — Ambulatory Visit: Payer: MEDICARE

## 2017-02-25 DIAGNOSIS — H02843 Edema of right eye, unspecified eyelid: Secondary | ICD-10-CM

## 2017-02-25 DIAGNOSIS — I10 Essential (primary) hypertension: Secondary | ICD-10-CM

## 2017-02-25 DIAGNOSIS — M79674 Pain in right toe(s): Secondary | ICD-10-CM

## 2017-02-25 MED ORDER — VITAMIN D2 (ERGOCALCIFEROL) 50000 UNITS PO CAPS
50000 [IU] | ORAL_CAPSULE | ORAL | 1 refills | Status: AC
Start: 2017-02-25 — End: 2017-07-01

## 2017-03-02 ENCOUNTER — Telehealth: Payer: MEDICAID

## 2017-03-09 ENCOUNTER — Ambulatory Visit: Payer: BLUE CROSS/BLUE SHIELD

## 2017-03-09 DIAGNOSIS — H1131 Conjunctival hemorrhage, right eye: Secondary | ICD-10-CM

## 2017-03-18 ENCOUNTER — Telehealth: Payer: BLUE CROSS/BLUE SHIELD

## 2017-03-22 MED ORDER — AMLODIPINE BESYLATE 5 MG PO TABS
5 mg | ORAL_TABLET | Freq: Every day | ORAL | 0 refills
Start: 2017-03-22 — End: ?

## 2017-04-01 ENCOUNTER — Ambulatory Visit: Payer: MEDICARE

## 2017-04-01 DIAGNOSIS — I10 Essential (primary) hypertension: Secondary | ICD-10-CM

## 2017-04-01 MED ORDER — CETIRIZINE HCL 5 MG PO TABS
5 mg | ORAL_TABLET | Freq: Every day | ORAL | 2 refills | Status: AC | PRN
Start: 2017-04-01 — End: 2017-06-03

## 2017-04-29 ENCOUNTER — Ambulatory Visit: Payer: BLUE CROSS/BLUE SHIELD

## 2017-04-29 DIAGNOSIS — I10 Essential (primary) hypertension: Secondary | ICD-10-CM

## 2017-04-29 DIAGNOSIS — Z0000 Encounter for general adult medical examination without abnormal findings: Secondary | ICD-10-CM

## 2017-04-29 DIAGNOSIS — Z1211 Encounter for screening for malignant neoplasm of colon: Secondary | ICD-10-CM

## 2017-05-11 ENCOUNTER — Telehealth: Payer: MEDICARE

## 2017-05-11 ENCOUNTER — Telehealth: Payer: BLUE CROSS/BLUE SHIELD

## 2017-05-25 ENCOUNTER — Telehealth: Payer: BLUE CROSS/BLUE SHIELD

## 2017-05-27 ENCOUNTER — Ambulatory Visit: Payer: BLUE CROSS/BLUE SHIELD

## 2017-05-27 DIAGNOSIS — R1032 Left lower quadrant pain: Secondary | ICD-10-CM

## 2017-05-27 DIAGNOSIS — Z1211 Encounter for screening for malignant neoplasm of colon: Secondary | ICD-10-CM

## 2017-05-27 DIAGNOSIS — K8020 Calculus of gallbladder without cholecystitis without obstruction: Secondary | ICD-10-CM

## 2017-05-27 DIAGNOSIS — R238 Other skin changes: Secondary | ICD-10-CM

## 2017-05-27 DIAGNOSIS — R1084 Generalized abdominal pain: Secondary | ICD-10-CM

## 2017-05-27 DIAGNOSIS — K429 Umbilical hernia without obstruction or gangrene: Secondary | ICD-10-CM

## 2017-06-03 ENCOUNTER — Ambulatory Visit: Payer: Medicaid HMO

## 2017-06-14 ENCOUNTER — Ambulatory Visit: Payer: BLUE CROSS/BLUE SHIELD

## 2017-06-17 ENCOUNTER — Ambulatory Visit: Payer: MEDICARE

## 2017-06-17 ENCOUNTER — Inpatient Hospital Stay: Payer: BLUE CROSS/BLUE SHIELD

## 2017-06-17 DIAGNOSIS — R1084 Generalized abdominal pain: Secondary | ICD-10-CM

## 2017-07-01 ENCOUNTER — Ambulatory Visit: Payer: Medicaid HMO

## 2017-07-01 DIAGNOSIS — M170 Bilateral primary osteoarthritis of knee: Secondary | ICD-10-CM

## 2017-07-01 MED ORDER — VITAMIN D2 (ERGOCALCIFEROL) 50000 UNITS PO CAPS
50000 [IU] | ORAL_CAPSULE | ORAL | 1 refills | Status: AC
Start: 2017-07-01 — End: 2017-10-06

## 2017-07-28 ENCOUNTER — Ambulatory Visit: Payer: BLUE CROSS/BLUE SHIELD

## 2017-07-29 ENCOUNTER — Ambulatory Visit: Payer: Medicaid HMO

## 2017-08-04 ENCOUNTER — Ambulatory Visit: Payer: MEDICARE

## 2017-08-04 DIAGNOSIS — B070 Plantar wart: Secondary | ICD-10-CM

## 2017-08-04 DIAGNOSIS — Z23 Encounter for immunization: Secondary | ICD-10-CM

## 2017-08-05 ENCOUNTER — Ambulatory Visit: Payer: MEDICARE

## 2017-08-16 ENCOUNTER — Ambulatory Visit: Payer: BLUE CROSS/BLUE SHIELD

## 2017-08-18 ENCOUNTER — Ambulatory Visit: Payer: MEDICAID

## 2017-08-18 ENCOUNTER — Ambulatory Visit: Payer: BLUE CROSS/BLUE SHIELD

## 2017-08-18 DIAGNOSIS — L720 Epidermal cyst: Secondary | ICD-10-CM

## 2017-08-18 DIAGNOSIS — L700 Acne vulgaris: Secondary | ICD-10-CM

## 2017-08-18 MED ORDER — RETIN-A MICRO PUMP 0.04 % EX GEL
Freq: Every evening | TOPICAL | 11 refills | Status: AC
Start: 2017-08-18 — End: 2018-09-06

## 2017-08-18 MED ORDER — SULFACETAMIDE SODIUM-SULFUR 9-4.5 % EX LIQD
11 refills | Status: AC
Start: 2017-08-18 — End: 2019-01-13

## 2017-08-18 MED ORDER — BENZOYL PEROXIDE-ERYTHROMYCIN 5-3 % EX GEL
11 refills | Status: AC
Start: 2017-08-18 — End: 2019-01-13

## 2017-08-18 NOTE — Progress Notes
PATIENT: Dustin Watts  MRN: [id number]  DOB: 1956-01-01  DATE OF SERVICE: 08/18/2017    REFERRING PRACTITIONER: No ref. provider found  PRIMARY CARE PROVIDER: Murtis Sink., MD, MPH  CHIEF COMPLAINT:   Chief Complaint   Patient presents with   ??? Follow-up     6 month f/u / Medication refill       Subjective:      Dustin Watts is a 61 y.o. year old male who was seen in clinic for follow up acne and lichen simplex chronicus.    Last seen 02/17/2017    Currently only using his retin-a and no other retinols. Wondering if he should have a facial.    Wonders if it could be creams. He puts various serums, collagen cream, roc cream, tightening cream, something from a TV dermatologist.   Using topicals - sumadan wash bid, retin-a micro 0.04% gel - now the pump - nightly, and benzamycin gel daily prn acne.    Past Medical History:   Diagnosis Date   ??? Acne    ??? Arthritis    ??? Cerebral palsy    ??? Eczema    ??? Hypertension    ??? Impaired glucose tolerance 03/23/2016    HGBA1C of 5.7 on 03/19/2016    ??? Low vitamin D level 05/07/2016    05/07/2016 - taking antiepiletpic which may exacerbate it, started on ergo 50000, will recheck level in about 2 months    ??? Seizure 1973    chronically on Dilantin, no seizure in 25 years, followed by Neurologist (Dr. Wiliam Ke) at [facility]   ??? Shortening of arm, congenital     right arm     Past Surgical History:   Procedure Laterality Date   ??? rectal abscess  2010    surgery done at [facility] History   Problem Relation Age of Onset   ??? Heart disease Mother    ??? Diabetes Mother    ??? Kidney disease Mother    ??? Stroke Brother      Social History     Social History   ??? Marital status: Married     Spouse name: N/A   ??? Number of children: 0   ??? Years of education: N/A     Occupational History   ??? 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 Smoking status: Never Smoker   ??? Smokeless tobacco: Never Used   ??? Alcohol use No   ??? Drug use: No ??? Sexual activity: Not Currently     Partners: Female      Comment: never married (incorrectly documented in Epic)     Other Topics Concern   ??? Do You Exercise At Least A Day, 3 Or More Days A Week? No   ??? Types Of Exercise? (List In Comments) Yes     biking infrequently   ??? Do You Follow A Special Diet? No   ??? Vegan? No   ??? Vegetarian? No   ??? Gluten Free? No   ??? Omnivore? Yes     Social History Narrative   ??? No narrative on file     Outpatient Medications Prior to Visit   Medication Sig   ??? benzoyl peroxide-erythromycin 5-3% gel Apply to acne once daily.Marland Kitchen   ??? DILANTIN 100 MG ER capsule TAKE 4 CAPSULES BY MOUTH AT BEDTIME   ???  ergocalciferol 50000 units capsule Take 1 capsule (50,000 Units total) by mouth every seven (7) days for 24 doses.   ??? RETIN-A MICRO PUMP 0.04 % gel Apply topically at bedtime.   ??? sulfacetamide-sulfur 9-4.5 % LIQD liquid Apply to face twice daily.     No facility-administered medications prior to visit.        Allergies   Allergen Reactions   ??? Sulfa Antibiotics Angioedema     No anaphylaxis   ??? Amlodipine Other (See Comments)     Lip swelling without anaphylaxis   ??? Carvedilol Angioedema     Lip swelling without anaphylaxis   ??? Losartan Rash     No hive, no anaphylaxis, unclear if truly has rash from this medicine   ??? Penicillins Rash     ''bumps on body''         Review of Systems:  Constitutional: negative  Skin:  otherwise negative      Objective:        General:   alert, appears stated age and cooperative     Neurologic:   Grossly normal   Psychiatric:   oriented to time, place and person, mood and affect are within normal limits     Skin Exam:  Skin Type: 4  Head/face: examined  Eyes (lids/conjunctiva): examined  Lips/gums/teeth: examined  Neck: examined  All areas examined were within normal limits with the following exceptions: face appears clear today. Mild periorbital edema on right lower eye. Few yellow 1mm papules/nodules under eyes.    Office Visit on 08/04/2017 Component Date Value Ref Range Status   ??? Hgb A1c - HPLC 08/04/2017 5.9* <5.7 % Final    For patients with diabetes, an A1c less than (<) or equal (=) to 7.0% is recommended for most patients, however the goal may be higher or lower depending on age and/or other medical problems.   For a diagnosis of diabetes, A1c greater than (>) or equal(=) to 6.5% indicates diabetes; values between 5.7% and 6.4% may indicate an increased risk of developing diabetes.   ??? Vitamin D,25-Hydroxy 08/04/2017 45  20 - 50 ng/mL Final    Deficiency: less than 12 ng/mL                                                   Inadequate: 12-19 ng/mL                                                       Adequate: 20-50 ng/mL                                                             Potential adverse effects: greater than 50 ng/mL       ??? Cholesterol 08/04/2017 243  See Comment mg/dL Final      The significance of total cholesterol depends on the values of LDL, HDL, triglycerides and the clinical context. A patient-provider discussion may be considered.       ???  Cholesterol,LDL,Calc 08/04/2017 149* <100 mg/dL Final    If LDL value falls outside of the designated range AND if  included in any of the following categories, a  patient-provider discussion is recommended.     Statin therapy is recommended for individuals:  1. with clinical atherosclerotic cardiovascular disease     irrespective of LDL levels;  2. with LDL > or = 190 mg/dL;  3. with diabetes, aged 40-75 years, with LDL between 70 and     189 mg/dL;  4. without any of the above but who have LDL between 70 and     189 mg/dL and an estimated 82-NFAO risk of     atherosclerotic cardiovascular disease > or = 7.5%     (consider statin therapy if estimated 10-year risk > or =     5.0%) (ACC/AHA 2013 Guidelines).   ??? Cholesterol, HDL 08/04/2017 63  >40 mg/dL Final    If HDL cholesterol level falls outside of the designated  range, a patient-provider discussion is recommended ??? Triglycerides 08/04/2017 156* <150 mg/dL Final      If Triglyceride level falls outside of the designated range,  a patient-provider discussion is recommended.     ??? Non-HDL,Chol,Calc 08/04/2017 180* <130 mg/dL Final    If Non-HDL cholesterol level falls outside of the designated  range, a patient-provider discussion is recommended.   Office Visit on 05/27/2017   Component Date Value Ref Range Status   ??? Fecal Occult Blood Immunoassay 05/27/2017 Negative  Negative Final   ??? White Blood Cell Count 05/27/2017 5.08  4.16 - 9.95 x10E3/uL Final   ??? Red Blood Cell Count 05/27/2017 4.23* 4.41 - 5.95 x10E6/uL Final   ??? Hemoglobin 05/27/2017 14.3  13.5 - 17.1 g/dL Final   ??? Hematocrit 05/27/2017 42.8  38.5 - 52.0 % Final   ??? Mean Corpuscular Volume 05/27/2017 101.2* 79.3 - 98.6 fL Final   ??? Mean Corpuscular Hemoglobin 05/27/2017 33.8* 26.4 - 33.4 pg Final   ??? MCH Concentration 05/27/2017 33.4  31.5 - 35.5 g/dL Final   ??? Red Cell Distribution Width-SD 05/27/2017 53.2* 36.9 - 48.3 fL Final   ??? Red Cell Distribution Width-CV 05/27/2017 14.3  11.1 - 15.5 % Final   ??? Platelet Count, Auto 05/27/2017 163  143 - 398 x10E3/uL Final   ??? Mean Platelet Volume 05/27/2017 11.6  9.3 - 13.0 fL Final   ??? Nucleated RBC%, automated 05/27/2017 0.0  No Ref. Range % Final    Percent Reference Range Not Reported per accrediting agency   ??? Absolute Nucleated RBC Count 05/27/2017 0.00  0.00 - 0.00 x10E3/uL Final   ??? APTT 05/27/2017 27.9  23.9 - 31.4 seconds Final   ??? Prothrombin Time 05/27/2017 11.2  9.7 - 12.4 seconds Final   ??? INR 05/27/2017 1.1   Final      Therapeutic Range: INR 2-3  Mechanical Valves: INR 2.5-3.5     ??? Lipase 05/27/2017 26  9 - 63 U/L Final   ??? Sodium 05/27/2017 143  135 - 146 mmol/L Final   ??? Potassium 05/27/2017 4.9  3.6 - 5.3 mmol/L Final   ??? Chloride 05/27/2017 103  96 - 106 mmol/L Final   ??? Total CO2 05/27/2017 24  20 - 30 mmol/L Final   ??? Anion Gap 05/27/2017 16  8 - 19 Final ??? Glucose 05/27/2017 104* 65 - 99 mg/dL Final   ??? GFR Estimate for Non-African Ameri* 05/27/2017 >89  See GFR Additional Information Final   ??? GFR Estimate  for African American 05/27/2017 >89  See GFR Additional Information Final   ??? GFR Additional Information 05/27/2017 See Comment   Final    GFR >89        Normal  GFR 60 - 89    Normal to mildly decreased  GFR 45 - 59    Mildly to moderately decreased  GFR 30 - 44    Moderately to severely decreased  GFR 15 - 29    Severely decreased  GFR <15        Kidney failure  The CKD-EPI equation was used to estimate GFR and assumes stable creatinine concentrations.  Results are in mL/min/1.73 square meters.   ??? Creatinine 05/27/2017 0.62  0.60 - 1.30 mg/dL Final   ??? Urea Nitrogen 05/27/2017 11  7 - 22 mg/dL Final   ??? Calcium 16/08/9603 9.1  8.6 - 10.4 mg/dL Final   ??? Total Protein 05/27/2017 7.6  6.1 - 8.2 g/dL Final   ??? Albumin 54/07/8118 4.7  3.9 - 5.0 g/dL Final   ??? Bilirubin,Total 05/27/2017 0.3  0.1 - 1.2 mg/dL Final   ??? Alkaline Phosphatase 05/27/2017 111  37 - 113 U/L Final   ??? Aspartate Aminotransferase 05/27/2017 24  13 - 47 U/L Final   ??? Alanine Aminotransferase 05/27/2017 24  8 - 64 U/L Final   ??? Sedimentation rate, Erythrocyte 05/27/2017 13* <=12 mm/hr Final   Office Visit on 04/29/2017   Component Date Value Ref Range Status   ??? Cholesterol 04/29/2017 254  See Comment mg/dL Final      The significance of total cholesterol depends on the values of LDL, HDL, triglycerides and the clinical context. A patient-provider discussion may be considered.       ??? Cholesterol,LDL,Calc 04/29/2017 165* <100 mg/dL Final    If LDL value falls outside of the designated range AND if  included in any of the following categories, a  patient-provider discussion is recommended.     Statin therapy is recommended for individuals:  1. with clinical atherosclerotic cardiovascular disease     irrespective of LDL levels;  2. with LDL > or = 190 mg/dL; 3. with diabetes, aged 40-75 years, with LDL between 70 and     189 mg/dL;  4. without any of the above but who have LDL between 70 and     189 mg/dL and an estimated 14-NWGN risk of     atherosclerotic cardiovascular disease > or = 7.5%     (consider statin therapy if estimated 10-year risk > or =     5.0%) (ACC/AHA 2013 Guidelines).   ??? Cholesterol, HDL 04/29/2017 61  >40 mg/dL Final    If HDL cholesterol level falls outside of the designated  range, a patient-provider discussion is recommended   ??? Triglycerides 04/29/2017 139  <150 mg/dL Final      If Triglyceride level falls outside of the designated range,  a patient-provider discussion is recommended.     ??? Non-HDL,Chol,Calc 04/29/2017 193* <130 mg/dL Final    If Non-HDL cholesterol level falls outside of the designated  range, a patient-provider discussion is recommended.   ??? Hgb A1c - HPLC 04/29/2017 5.9* <5.7 % Final    For patients with diabetes, an A1c less than (<) or equal (=) to 7.0% is recommended for most patients, however the goal may be higher or lower depending on age and/or other medical problems.   For a diagnosis of diabetes, A1c greater than (>) or equal(=) to 6.5% indicates diabetes; values  between 5.7% and 6.4% may indicate an increased risk of developing diabetes.   Office Visit on 02/25/2017   Component Date Value Ref Range Status   ??? Vitamin D,25-Hydroxy 02/25/2017 31  20 - 50 ng/mL Final    Deficiency: less than 12 ng/mL                                                   Inadequate: 12-19 ng/mL                                                       Adequate: 20-50 ng/mL                                                             Potential adverse effects: greater than 50 ng/mL       ??? Cholesterol 02/25/2017 263  See Comment mg/dL Final      The significance of total cholesterol depends on the values of LDL, HDL, triglycerides and the clinical context. A patient-provider discussion may be considered. ??? Cholesterol,LDL,Calc 02/25/2017 153* <100 mg/dL Final    If LDL value falls outside of the designated range AND if  included in any of the following categories, a  patient-provider discussion is recommended.     Statin therapy is recommended for individuals:  1. with clinical atherosclerotic cardiovascular disease     irrespective of LDL levels;  2. with LDL > or = 190 mg/dL;  3. with diabetes, aged 40-75 years, with LDL between 70 and     189 mg/dL;  4. without any of the above but who have LDL between 70 and     189 mg/dL and an estimated 16-XWRU risk of     atherosclerotic cardiovascular disease > or = 7.5%     (consider statin therapy if estimated 10-year risk > or =     5.0%) (ACC/AHA 2013 Guidelines).   ??? Cholesterol, HDL 02/25/2017 61  >40 mg/dL Final    If HDL cholesterol level falls outside of the designated  range, a patient-provider discussion is recommended   ??? Triglycerides 02/25/2017 244* <150 mg/dL Final      If Triglyceride level falls outside of the designated range,  a patient-provider discussion is recommended.     ??? Non-HDL,Chol,Calc 02/25/2017 202* <130 mg/dL Final    If Non-HDL cholesterol level falls outside of the designated  range, a patient-provider discussion is recommended.            Assessment:           Plan/ Recommendation:        Diagnoses and associated orders for this visit:    Acne - doing well, continue:  - benzoyl peroxide-erythromycin 5-3% gel; Apply to acne once daily.  - tretinoin microspheres (RETIN-A MICRO) 0.04% gel; Apply nightly to affected areas.  - Sulfacetamide Sodium-Sulfur (SUMADAN WASH) 9-4.5 % LIQD; Apply 1 drop topically two (2) times daily. WASH AFFECTED AREA(S) TWICE DAILY AS DIRECTED    Milia,  periorbital edema - reassurance      Assessment and plan were discussed with the patient. Potential medication side-effects and interactions discussed.  Time-based billing: Patient counseled by attending or care coordinated for 10 minutes out of 15 minutes total visit time.    Follow up: 6 months or sooner prn     Author:  Arbutus Ped 08/18/2017 9:03 AM

## 2017-08-18 NOTE — Patient Instructions
Instructions for tretinoin (Retin-A), adapalene (Differin), and/or tazarotene (Tazorac) topical acne medications.    1. Wash your face nightly with gentle face wash such as Neutrogena ultra gentle creamy formula or Cetaphil cleanser.    2. Make sure your face is dry.    3. Apply a gentle moisturizer like Cetaphil facial moisturizer (without sunscreen).    4. Apply a pea-sized amount of whichever topical medication above that you are using. The pea-sized amount goes on the ENTIRE face. A small amount goes a long way and more is not better.    5. If you get red, or your skin peels, apply the medication every other night or every third night as tolerated. Your skin should get used to it within 2-4 weeks.

## 2017-08-20 ENCOUNTER — Inpatient Hospital Stay: Payer: Medicaid HMO

## 2017-08-20 ENCOUNTER — Ambulatory Visit: Payer: Medicaid HMO

## 2017-08-20 DIAGNOSIS — G40909 Epilepsy, unspecified, not intractable, without status epilepticus: Secondary | ICD-10-CM

## 2017-08-20 DIAGNOSIS — M170 Bilateral primary osteoarthritis of knee: Secondary | ICD-10-CM

## 2017-08-20 MED ORDER — IBUPROFEN 800 MG PO TABS
800 mg | ORAL_TABLET | Freq: Two times a day (BID) | ORAL | 0 refills | Status: AC | PRN
Start: 2017-08-20 — End: 2017-11-20

## 2017-08-20 MED ORDER — IBUPROFEN 800 MG PO TABS
800 mg | ORAL_TABLET | Freq: Two times a day (BID) | ORAL | 0 refills | Status: AC | PRN
Start: 2017-08-20 — End: 2017-08-20

## 2017-08-20 NOTE — Patient Instructions
XR knees  Take Motrin Ibuprofen only as needed due to side effects stomach upset; see information  Ibuprofen Oral capsule  What is this medicine?  IBUPROFEN (eye BYOO proe fen) is a non-steroidal anti-inflammatory drug (NSAID). It is used for dental pain, fever, headaches or migraines, osteoarthritis, rheumatoid arthritis, or painful monthly periods. It can also relieve minor aches and pains caused by a cold, flu, or sore throat.  This medicine may be used for other purposes; ask your health care provider or pharmacist if you have questions.  What should I tell my health care provider before I take this medicine?  They need to know if you have any of these conditions:  ??? asthma  ??? cigarette smoker  ??? drink more than 3 alcohol containing drinks a day  ??? heart disease or circulation problems such as heart failure or leg edema (fluid retention)  ??? high blood pressure  ??? kidney disease  ??? liver disease  ??? stomach bleeding or ulcers  ??? an unusual or allergic reaction to ibuprofen, aspirin, other NSAIDS, other medicines, foods, dyes, or preservatives  ??? pregnant or trying to get pregnant  ??? breast-feeding  How should I use this medicine?  Take this medicine by mouth with a glass of water. Follow the directions on the prescription label. Take this medicine with food if your stomach gets upset. Try to not lie down for at least 10 minutes after you take the medicine. Take your medicine at regular intervals. Do not take your medicine more often than directed.  A special MedGuide will be given to you by the pharmacist with each prescription and refill. Be sure to read this information carefully each time.  Talk to your pediatrician regarding the use of this medicine in children. Special care may be needed.  Overdosage: If you think you have taken too much of this medicine contact a poison control center or emergency room at once.  NOTE: This medicine is only for you. Do not share this medicine with others. What if I miss a dose?  If you miss a dose, take it as soon as you can. If it is almost time for your next dose, take only that dose. Do not take double or extra doses.  What may interact with this medicine?  Do not take this medicine with any of the following medications:  ??? cidofovir  ??? ketorolac  ??? methotrexate  ??? pemetrexed  This medicine may also interact with the following medications:  ??? alcohol  ??? aspirin  ??? diuretics  ??? lithium  ??? other drugs for inflammation like prednisone  ??? warfarin  This list may not describe all possible interactions. Give your health care provider a list of all the medicines, herbs, non-prescription drugs, or dietary supplements you use. Also tell them if you smoke, drink alcohol, or use illegal drugs. Some items may interact with your medicine.  What should I watch for while using this medicine?  Tell your doctor or healthcare professional if your symptoms do not start to get better or if they get worse.  This medicine does not prevent heart attack or stroke. In fact, this medicine may increase the chance of a heart attack or stroke. The chance may increase with longer use of this medicine and in people who have heart disease. If you take aspirin to prevent heart attack or stroke, talk with your doctor or health care professional.  Do not take other medicines that contain aspirin, ibuprofen, or naproxen with this  medicine. Side effects such as stomach upset, nausea, or ulcers may be more likely to occur. Many medicines available without a prescription should not be taken with this medicine.  This medicine can cause ulcers and bleeding in the stomach and intestines at any time during treatment. Ulcers and bleeding can happen without warning symptoms and can cause death. To reduce your risk, do not smoke cigarettes or drink alcohol while you are taking this medicine.  You may get drowsy or dizzy. Do not drive, use machinery, or do anything that needs mental alertness until you know how this medicine affects you. Do not stand or sit up quickly, especially if you are an older patient. This reduces the risk of dizzy or fainting spells.  This medicine can cause you to bleed more easily. Try to avoid damage to your teeth and gums when you brush or floss your teeth.  This medicine may be used to treat migraines. If you take migraine medicines for 10 or more days a month, your migraines may get worse. Keep a diary of headache days and medicine use. Contact your healthcare professional if your migraine attacks occur more frequently.  What side effects may I notice from receiving this medicine?  Side effects that you should report to your doctor or health care professional as soon as possible:  ??? allergic reactions like skin rash, itching or hives, swelling of the face, lips, or tongue  ??? severe stomach pain  ??? signs and symptoms of bleeding such as bloody or black, tarry stools; red or dark-brown urine; spitting up blood or brown material that looks like coffee grounds; red spots on the skin; unusual bruising or bleeding from the eye, gums, or nose  ??? signs and symptoms of a blood clot such as changes in vision; chest pain; severe, sudden headache; trouble speaking; sudden numbness or weakness of the face, arm, or leg  ??? unexplained weight gain or swelling  ??? unusually weak or tired  ??? yellowing of eyes or skin  Side effects that usually do not require medical attention (report to your doctor or health care professional if they continue or are bothersome):  ??? bruising  ??? diarrhea  ??? dizziness, drowsiness  ??? headache  ??? nausea, vomiting  This list may not describe all possible side effects. Call your doctor for medical advice about side effects. You may report side effects to FDA at 1-800-FDA-1088.  Where should I keep my medicine?  Keep out of the reach of children.   Store at room temperature between 15 and 30 degrees C (59 and 86 degrees F). Keep container tightly closed. Throw away any unused medicine after the expiration date.  NOTE:This sheet is a summary. It may not cover all possible information. If you have questions about this medicine, talk to your doctor, pharmacist, or health care provider. Copyright??? 2015 Gold Standard    Osteoarthritis: Information from the [employer] of Rheumatology    Osteoarthritis is a joint disease that most often affects middle-age to elderly people. It is commonly referred to as OA or as ''wear and tear'' of the joints, but we now know that OA is a disease of the entire joint, involving the cartilage, joint lining, ligaments, and bone. Although it is more common in older people, it is not really accurate to say that the joints are just ''wearing out.''  About 27 million Americans are living with OA, the most common form of joint disease. The lifetime risk of developing OA of the  knee is about 46%, and the lifetime risk of developing OA of the hip is 25%, according to the [facility], a long-term study from the [place] and sponsored by [employer] for [employer] and Prevention (often called the [employer]) and the [employer].  OA is a top cause of disability in older people. The goal of treatment in OA is to reduce pain and improve function. There is no cure for the disease, but some treatments attempt to slow disease progression.  Fast facts  OA is the most common form of joint disease, and is a leading cause of disability in elderly people.   This arthritis tends to occur in the hand joints, spine, hips, knees, and great toes.   It is characterized by breakdown of the cartilage (the tissue that cushions the ends of the bones between joints), bony changes of the joints, deterioration of tendons and ligaments, and various degrees of inflammation of the synovium (joint lining). Though some of the joint changes are irreversible, most patients will not need joint replacement surgery.   OA symptoms (what you feel) can vary greatly among patients.   A rheumatologist can detect arthritis and prescribe the proper treatment.    What is osteoarthritis?  OA is a frequently slowly progressive joint disease typically seen in middle-aged to elderly people.  The disease occurs when the joint cartilage breaks down often because of mechanical stress or biochemical alterations, causing the bone underneath to fail. OA can occur together with other types of arthritis, such as gout or rheumatoid arthritis.  OA tends to affect commonly used joints such as the hands and spine, and the weight-bearing joints such as the hips and knees.   Symptoms include:   Joint pain and stiffness   Knobby swelling at the joint   Cracking or grinding noise with joint movement   Decreased function of the joint  Who gets osteoarthritis?  OA affects people of all races and both sexes. Most often, it occurs in  patients age 22 and above. However, it can occur sooner if you have  other risk factors (things that raise the risk of getting OA).   Risk factors include:   Older age   Having family members with OA   Obesity   Joint injury or repetitive use (overuse) of joints   Joint deformity such as unequal leg length, bowlegs or knocked knees    How is osteoarthritis diagnosed?  Most often doctors detect OA based on the typical symptoms (described earlier) and on results of the physical exam. In some cases, X-rays or other imaging tests may be useful to tell the extent of disease or to help rule out other joint problems.   How is osteoarthritis treated?  There is no proven treatment yet that can reverse joint damage from OA. The goal of treatment is to reduce pain and improve function of the affected joints. Most often, this is possible with a mixture of physical measures and drug therapy and, sometimes, surgery. Physical measures ??? Weight loss and exercise are useful in OA. Excess weight puts stress on your knee joints and hips and low back. For every 10 pounds of weight you lose over 10 years, you can reduce the chance of developing knee OA by up to 50%. Exercise can improve your muscle strength, decrease joint pain and stiffness, and lower the chance of disability due to OA.    Also helpful are support (''assistive'') devices, such as  braces or a walking cane, that help you do daily activities. Heat or cold therapy can help relieve OA symptoms for a short time.     Certain alternative treatments such as spa (hot tub), massage, acupuncture and chiropractic manipulation can help relieve pain for a short time. They can be costly, though, and require repeated treatments. Also, the long-term benefits of these alternative (sometimes called complementary or integrative) medicine treatments are unproven but are under study.     Drug Therapy ??? Forms of drug therapy include topical, oral (by mouth) and injections (shots). You apply topical drugs directly on the skin over the affected joints. These medicines include capsaicin cream, lidocaine and diclofenac gel. Oral pain relievers such as acetaminophen are common first treatments. So are nonsteroidal anti-inflammatory drugs (often called NSAIDs), which decrease swelling and pain.     In 2010, the government (FDA) approved the use of duloxetine (Cymbalta) for chronic (long-term) musculoskeletal pain including from OA. This oral drug is not new. It also is in use for other health concerns, such as mood disorders, nerve pain and fibromyalgia.     Patients with more serious pain may need stronger medications, such as prescription narcotics.     Joint injections with corticosteroids (sometimes called cortisone shots) or with a form of lubricant called hyaluronic acid can give months of pain relief from OA. This lubricant is given in the knee, and these shots may help delay the need for a knee replacement by a few years in some patients.     Surgery ??? Surgical treatment becomes an option for severe cases. This includes when the joint has serious damage, or when medical treatment fails to relieve pain and you have major loss of function. Surgery may involve arthroscopy, repair of the joint done through small incisions (cuts). If the joint damage cannot be repaired, you may need a joint replacement.    Supplements ??? Many over-the-counter nutrition supplements have been used for treatment of OA. Most lack good research data to support their effectiveness and safety. Among the most widely used are glucosamine/chondroitin sulfate, calcium and vitamin D, and omega-3 fatty acids. To ensure safety and avoid drug interactions, consult your doctor or pharmacist before using any of these supplements. This is especially true when you are combining these supplements with prescribed drugs.  Living with Osteoarthritis  There is no cure for OA, but you can manage how it affects your lifestyle. Some tips include:  Properly position and support your neck and back while sitting or sleeping.   Adjust furniture, such as raising a chair or toilet seat.   Avoid repeated motions of the joint, especially frequent bending.   Lose weight if you are overweight or obese, which can reduce pain and slow progression of OA.   Exercise each day.   Use arthritis support devices that will help you do daily activities.  You might want to work with a physical therapist or occupational therapist to learn the best exercises and to choose arthritis assistive devices.   Points to remember  OA is the most common form of arthritis and can occur together with other types of arthritis.   The goal of treatment in OA is to reduce pain and improve function.   Exercise is an important part of OA treatment because it can decrease joint pain and improve function. At present, there is no treatment that can reverse the damage of OA in the joints. Researchers are trying to find ways to slow or reverse this  joint damage.   The rheumatologist's role in the treatment of osteoarthritis  Rheumatologists are doctors who are experts in diagnosing and treating arthritis and other diseases of the joints, muscles and bones. You may also need to see other health care providers, for instance, physical or occupational therapists and orthopedic doctors.  For more information  The [employer] of Rheumatology has compiled this list to give you a starting point for your own additional research. The ACR does not endorse or maintain these Web sites, and is not responsible for any information or claims provided on them. It is always best to talk with your rheumatologist for more information and before making any decisions about your care.  Arthritis Foundation  ww.arthritis.[facility] of Arthritis and Musculoskeletal and Skin Diseases   www.niams.http://www.myers.net/  Rheumatology Research Foundation   Learn how the Rheumatology Research Foundation advances research and training to improve the health of people with rheumatic diseases.  www.rheumatology.org/REF  Updated February 2012   Written by Manning Charity, MD, and reviewed by the [facility] of Rheumatology Communications and Marketing Committee.

## 2017-08-20 NOTE — Progress Notes
RHEUMATOLOGY OUTPATIENT CONSULTATION  PATIENT: Dustin Watts MRN: [id number] DOB: 28-Apr-1956  REASONS FOR VISIT/CHIEF COMPLAINT:OA      [id number] pt of Dr Roselee Nova with generalized OA, OK knees here to establish care.  Hx of epilepsy, pre-diabetic, HTN, HLD, vit D deficiency    Current visit patient reports the followings:-  -last visit 02/2017 joints are doing fine on prn NSAID/post CVA  -knees are doing not too badly ''only after long walk'' ''described as blown up'', keen to have refill ibuprofen 800 mg PRN given by Dr Roselee Nova in past ''I only take it if I have a long walk to do so taking infrequently''; no stiffness; at times puffy not today; slight worse lately with pain   -other joints are fine  -chronic R sided atrophy since borne Dx cerebral palsy otherwise in good health  -no seizure for some time now      No known history of malar rash, photosensitivity rash, discoid lesions, oral/nasal ulcers,  pleuritic chest pain, seizures/psychosis, Raynaud's, skin tightness, dry eyes/mouth, hair loss or blood clots.  No history of muscle pain or proximal myopathy.  No history for fever, weight loss, loss of appetite, night sweat or lymphadenopathy.  No history of skin psoriasis, eye issues, STDs or IBD.    No recent travelling.  No sick contacts.    Prior medication/Joint injections/Immune suppressant therapy:  None    ROS:  A 14 point ROS was obtained and significant for what is mentioned in HPI and below.    ROS    (Check if Normal, or note + findings):   []    reviewed questionnaire on 08/19/2017    []   Not Obtainable :   [x]    Constit : [-] fever, [-] weight loss    [x]    Skin :[-] vasculitic palpable rash, [-] tender nodules, [-]scaling plaques      [x]    Eyes : [-] photophobia, [-] red eye [-] gritty eyes    [x]    CV :[-] LE swelling, orthopnea, [-] PND,    []    MS :     [x]    ENT: [-] ear /nose swelling, [-] throat pain or post nasal d/c   [x]    GI :[-] constipation [-] diarrhea [-] melena [-]heartburn,   [x]    Heme/Lymph :[-] easy bruising or bleeding      [x]   Resp : [-] DOE, [-] wheezing [-] pleurisy   [x]    GU:[-]dysruria, [-] gross hematuria   []    Neuro :[-] focal neurological [-] wrist or foot drop     [x]    Imm/All :[-] recurrent infections in past, [-] pollen or seasonal allergy   [x]   Endo :   [x]    Psych: [-] mood, [-] memory     PMHx:  Past Medical History:   Diagnosis Date   ??? Acne    ??? Arthritis    ??? Cerebral palsy    ??? Eczema    ??? Hypertension    ??? Impaired glucose tolerance 03/23/2016    HGBA1C of 5.7 on 03/19/2016    ??? Low vitamin D level 05/07/2016    05/07/2016 - taking antiepiletpic which may exacerbate it, started on ergo 50000, will recheck level in about 2 months    ??? Seizure 1973    chronically on Dilantin, no seizure in 25 years, followed by Neurologist (Dr. Wiliam Ke) at [facility]   ??? Shortening of arm, congenital     right arm  PSHx:  Past Surgical History:   Procedure Laterality Date   ??? rectal abscess  2010    surgery done at [facility] the [facility]       Meds:  Current Outpatient Prescriptions   Medication Sig   ??? benzoyl peroxide-erythromycin 5-3% gel Apply to acne once daily.Marland Kitchen   ??? DILANTIN 100 MG ER capsule TAKE 4 CAPSULES BY MOUTH AT BEDTIME   ??? ergocalciferol 50000 units capsule Take 1 capsule (50,000 Units total) by mouth every seven (7) days for 24 doses.   ??? RETIN-A MICRO PUMP 0.04 % gel Apply topically at bedtime.   ??? sulfacetamide-sulfur 9-4.5 % LIQD liquid Apply to face twice daily.     No current facility-administered medications for this visit.        Allergies:  Allergies   Allergen Reactions   ??? Sulfa Antibiotics Angioedema     No anaphylaxis   ??? Amlodipine Other (See Comments)     Lip swelling without anaphylaxis   ??? Carvedilol Angioedema     Lip swelling without anaphylaxis   ??? Losartan Rash     No hive, no anaphylaxis, unclear if truly has rash from this medicine   ??? Penicillins Rash     ''bumps on body''       SocHx:  Social History Substance Use Topics   ??? Smoking status: Never Smoker   ??? Smokeless tobacco: Never Used   ??? Alcohol use No     Works in Loss adjuster, chartered; single    FamHx:  Family History   Problem Relation Age of Onset   ??? Heart disease Mother    ??? Diabetes Mother    ??? Kidney disease Mother    ??? Stroke Brother        Physical Exam:  BP 128/72  ~ Pulse 70  ~ Temp 36.4 ???C (97.5 ???F) (Oral)  ~ Resp 16  ~ Ht 6' (1.829 m)  ~ Wt 205 lb (93 kg)  ~ SpO2 97%  ~ BMI 27.80 kg/m???     Wt Readings from Last 3 Encounters:   08/04/17 209 lb 10.5 oz (95.1 kg)   07/01/17 209 lb 10.5 oz (95.1 kg)   06/03/17 208 lb 12.4 oz (94.7 kg)     BP Readings from Last 3 Encounters:   08/04/17 (!) 161/94   07/01/17 147/88   06/03/17 151/86       NAD  No ocular injection  No alopecia  OP clear  No LAD  CTAB  RRR  S/NT/ND  No C/C/E  No rashes  No telangectasia/discoloration/calcinosis/sclerodactyly  No tender joints  No swollen joints  L knee crepitus+ no effusion  No enthesitis  No dactylitis  No myofascial pain  Neuro grossly intact except R extremities atrophy  Peripheral pulses intact      RIGHT         LEFT   TMJ [] []   ~ [] []  TMJ   Shoulder [] []  ~ [] []  Shoulder                Left box: Tender   Elbow [] []  ~ [] []  Elbow               Right box: Swollen   Wrist [] []  ~ [] []  Wrist   CMC1 [] []  ~ [] []  CMC1       MCP5 MCP4 MCP3 MCP2 MCP1         ~ MCP1 MCP2 MCP3 MCP4 MCP5       [] []  [] []  [] []  [] []  [] []   [] []  [] []  [] []  [] []  [] []   PIP5 PIP4 PIP3 PIP2 IP1 ~ IP1 PIP2 PIP3 PIP4 PIP5       [] []  [] []  [] []  [] []  [] []   [] []  [] []  [] []  [] []  [] []        DIP5 DIP4 DIP3 DIP2  ~  DIP2 DIP3 DIP4 DIP5       [] []  [] []  [] []  [] []     [] []  [] []  [] []  [] []       Sacroiliac []  ~             []  Sacroiliac                Hip  [] []  ~        [] []  Hip                  Knee  [] []  ~        [] []  Knee        Ankle    [] []  ~        [] []  Ankle           MTP5 MTP4 MTP3 MTP2 MTP1 ~ MTP1 MTP2 MTP3 MTP4 MCP5       [] []  [] []  [] []  [] []  [] []   [] []  [] []  [] []  [] []  [] []  Toe5 Toe4 Toe3 Toe2 Toe1 ~ Toe1 Toe2 Toe3 Toe4 Toe5        [] []  [] []  [] []  [] []  [] []   [] []  [] []  [] []  [] []  [] []                      Labs:  Lab Results   Component Value Date    WBC 5.08 05/27/2017    HGB 14.3 05/27/2017    HCT 42.8 05/27/2017    PLT 163 05/27/2017    NEUTABS 4.0 11/28/2014    LYMPHABS 1.7 11/28/2014     Lab Results   Component Value Date    GLUCOSE 104 (H) 05/27/2017    GFREST >89 04/18/2014    CREAT 0.62 05/27/2017    CALCIUM 9.1 05/27/2017    TOTPRO 7.6 05/27/2017    ALBUMIN 4.7 05/27/2017    AST 24 05/27/2017    ALT 24 05/27/2017    ALKPHOS 111 05/27/2017     @RRCOAGS @  Lab Results   Component Value Date    TSH 1.5 01/21/2017       Rheum labs:  Lab Results   Component Value Date    SRWEST 13 (H) 05/27/2017    SRWEST 15 (H) 11/28/2014     No results found for: CARDLIPIGA, CARDLIPIGG, CARDLIPIGM, ANAAB, ANAABTITER, B2GLYPROIGA, B2GLYPROIGG, B2GLYPROIGM, DRVVT, RNPAB, SMAB, SSA  No results found for: C3, C4, DSDNAAB, NDNAABIFA  No results found for: Lyman Bishop, MPOAB  Lab Results   Component Value Date    CKTOT 68 04/23/2008    URICACID 3.6 11/28/2014     Lab Results   Component Value Date    VITD25OH 45 08/04/2017    VITD25OH 31 02/25/2017     UA:   Lab Results   Component Value Date    PROTCLUR Trace 04/23/2008    BLDUR Negative 04/23/2008    LEUKESTUR Negative 04/23/2008    RBCSUR 5 04/23/2008    WBCSUR 3 04/23/2008       Studies: [x] reviewed [] ordered [] independently reviewed.    Imaging:  Knees 2007 normal     ASSESSMENT AND PLAN  KOLIN ERDAHL is a 61 y.o. male     Knees OA stable on prn prn ibuprofen 800 mg as needed; no GERD; pt takes rarely; recent slight worseing  Hx of epilepsy  pre-diabetic,  HTN, HLD  Hx vit D deficiency WNL 02/2017  overweight    RECOMMENDATIONS  - Refill ibuprofen 800 mg as PRN for knee pain/sied effects discussed  - continue to loose weight  - Repeat Knees XR/if worsening consider CSI      RTC  6 months or sooner as needed. CC note to PCP/referred physician    Orders Placed This Encounter   ??? XR knee ap+lat+sunrise standing bilat (3 views ea)   ??? ibuprofen 800 mg tablet         IMMUNIZATION STATUS  Immunization History   Administered Date(s) Administered   ??? Tdap 03/05/2016   ??? influenza vaccine IM quadrivalent (Fluarix Quad) (PF) SYR (71 months of age and older) 08/04/2017   ??? influenza vaccine IM quadrivalent (Fluzone Quad) (PF) SYR/SDV (64 years of age and older) 08/06/2016     Thank you for allowing me to participate in this patient's care.  If I can be of further service, please contact my office at  [phone].  [x]  >50%  of this * minute visit was spent in face-to-face evaluation and counseling the patient.  The above plan of care, diagnosis, orders, and follow-up were discussed with the patient. Questions related to this recommended plan of care were answered  []  Immunosuppression drugs changed. [x]  Risks/benefits/toxicities of meds discussed.   []  Reviewed and summarized old records. []  Requested outside medical records.  []  Discussed with other providers of complex medical conditions and management plans outlined above    Liana Gerold MD Rheumatology Division, [place] [phone])

## 2017-08-20 NOTE — Addendum Note
Addended byLiana Gerold on: 08/20/2017 08:45 AM     Modules accepted: Orders

## 2017-09-02 ENCOUNTER — Ambulatory Visit: Payer: BLUE CROSS/BLUE SHIELD

## 2017-09-07 ENCOUNTER — Ambulatory Visit: Payer: MEDICAID

## 2017-09-07 DIAGNOSIS — B070 Plantar wart: Secondary | ICD-10-CM

## 2017-09-07 DIAGNOSIS — L700 Acne vulgaris: Secondary | ICD-10-CM

## 2017-09-07 DIAGNOSIS — K130 Diseases of lips: Secondary | ICD-10-CM

## 2017-09-07 NOTE — Progress Notes
[facility] INTERNAL MEDICINE & PEDIATRICS - Office Note  [facility]  [address]  [place] [address]  Phone: [phone]  FAX: [phone]  [phone]     Subjective:   CC:  Plantar Warts (Left foot, no flu shot.)    HPI:  Dustin HANDLEY is a 61 y.o. male who presents for the following issue(s).    1. Wart  Timing (onset/duration) - last few months  Location - bottom (plantar) of L foot at the base of the 2nd and 3rd toes  Severity - mild discomfort  Modifying factors - had cryotherapy last month    2. Overweight  - daily sit ups  - lost a few pounds recently    3. Cracking of lips  - in the last few weeks  - recent cold symptoms of rhinorrhea and congestion  - not using chapstick  - painful, mild  - no bleeding    4. Abd pain  - RUQ and sometimes LLQ  - none are worsening  - has history of gall stones  - LLQ worsened by spicy foods  - able to do sit ups daily  - no N/V, no diarrhea/constipation      Review of Systems  Cracking of the lips.  Recent rhinorrhea and ear discharge without pain.  No fevers.  No vomiting.  No cough.  No SOB    Past Medical History  The following past medical history was reviewed with the patient.  He has a past medical history of Acne; Arthritis; Cerebral palsy; Eczema; Hypertension; Impaired glucose tolerance (03/23/2016); Low vitamin D level (05/07/2016); Seizure (1973); and Shortening of arm, congenital.    Medications/Supplements  The following medication list was reviewed with the patient.    Current Outpatient Prescriptions:   ???  benzoyl peroxide-erythromycin 5-3% gel, Apply to acne once daily.., Disp: 46.6 g, Rfl: 11  ???  DILANTIN 100 MG ER capsule, TAKE 4 CAPSULES BY MOUTH AT BEDTIME, Disp: , Rfl: 12  ???  ergocalciferol 50000 units capsule, Take 1 capsule (50,000 Units total) by mouth every seven (7) days for 24 doses., Disp: 12 capsule, Rfl: 1  ???  ibuprofen 800 mg tablet, Take 1 tablet (800 mg total) by mouth every twelve (12) hours as needed for Moderate Pain (Pain Scale 4-6)., Disp: 60 tablet, Rfl: 0  ???  RETIN-A MICRO PUMP 0.04 % gel, Apply topically at bedtime., Disp: 50 g, Rfl: 11  ???  sulfacetamide-sulfur 9-4.5 % LIQD liquid, Apply to face twice daily., Disp: 454 g, Rfl: 11    Objective:   Physical Exam  BP 132/74  ~ Pulse 76  ~ Temp 36.7 ???C (98 ???F) (Oral)  ~ Ht 1.829 m (6')  ~ Wt 93.7 kg (206 lb 9.1 oz)  ~ BMI 28.02 kg/m???    Wt Readings from Last 3 Encounters:   09/07/17 93.7 kg (206 lb 9.1 oz)   08/20/17 93 kg (205 lb)   08/04/17 95.1 kg (209 lb 10.5 oz)     Constitutional - alert, no distress  Eyes - clear conjunctiva, no discharge  Ears, Nose, Mouth and Throat - lips normal, mucosa normal  Neck - supple, midline trachea  Gastrointestinal - soft, nontender  Skin - normal texture, no rash, callous and deep wart structure at the base of the 2nd and 3rd toes of the plantar aspect of the L foot  Neuro - no gross focal deficits, normal speech  Psychiatric - normal  insight and judgement, normal mental status    Labs  I personally reviewed the following labs  Lab Results   Component Value Date    NA 143 05/27/2017    K 4.9 05/27/2017    CL 103 05/27/2017    CO2 24 05/27/2017    BUN 11 05/27/2017    CREAT 0.62 05/27/2017    GLUCOSE 104 (H) 05/27/2017    CALCIUM 9.1 05/27/2017    ALT 24 05/27/2017    AST 24 05/27/2017    ALKPHOS 111 05/27/2017    BILITOT 0.3 05/27/2017     Lab Results   Component Value Date    WBC 5.08 05/27/2017    HGB 14.3 05/27/2017    HCT 42.8 05/27/2017    MCV 101.2 (H) 05/27/2017    PLT 163 05/27/2017     Lab Results   Component Value Date    HGBA1C 5.9 (H) 08/04/2017     Lab Results   Component Value Date    VITD25OH 45 08/04/2017         Assessment/Plan:       ICD-9-CM ICD-10-CM    1. Plantar wart of left foot 078.12 B07.0    2. Acne vulgaris 706.1 L70.0    3. Gall bladder stones 574.20 K80.20    4. Cracked lip 528.5 K13.0     advised chapstick multiple times a day, f/u prn 5. White coat syndrome without hypertension 796.2 R03.0          Problem   White Coat Syndrome Without Hypertension    Lip swelling with carvedilol and amlodipine, rash with losartan, sulfa allergy with HCTZ.  02/25/2017- home measurements indicates mild elevation in the 130's/90's.  Would benefit from medications but patient declines to take any at this time and reports multiple allergies (as shown above).  Counseled patient on health consequences including ASCVD and possible death from untreated HTN.  Patient will concentrate on lifestyle modifications and f/u in 1 month.  To ER if over 180/110.    04/01/2017 - home BP results are much lower than in the office (manual measurement), possible because of white coat hypertension or possible inaccurate home BP cuff. Advised to bring in home cuff next visit.  Hold on restarting meds at this time.  04/29/2017 - not ideal level but improved compared to prior.  Will continue to hold BP meds as patient very concerned about side effects.  Continue with lifestyle modifications  06/03/2017 - home BP results continue to be reassuring, close to 120/80, and office Bp remains elevated, will change this diagnosis to white coat HTN, and monitor.  Advised patient to continue home BP checks.  F/u in every 3 months or so.  08/04/2017 - home records again reassuring, no meds, will monitor  09/07/2017 - again reviewed home records which were reassuring, will monitor     Plantar Wart of Left Foot    09/07/2017- two on plantar aspect at base of 2nd and 3rd toes.  Cryotherapy again today.  F/u in 1 month     Gall Bladder Stones    05/27/2017 - noted on ER discharge paperwork, patient unsure about specifics in the ER (cannot remember them completing [place].)  Will repeat US, and unless has symptoms consistent with GB disease will only need monitoring and counseling on weight loss.  09/07/2017- infrequent symptoms, not worsening, counseled on weight loss and healthy diet, f/u immediately if worsens, monitor for now     Acne    01/21/2017 - continuing  with retin-A, has seen derm in the past, no acne today, continue with retin-A prn and f/u if worsens.  09/07/2017- mild, continue management with derm that includes BP-erythromycin, tretinoin, and sulfacetamide         Procedure: Wart cryotherapy  Indication: wart (plantar)  Time out performed.  Patient gave verbal permission for the procedure.  The two warts ere treated with cryotherapy using a liquid nitrogen spray gun; 5 sprays were applied directly to the warts.  Patient tolerated the procedure well.  Complications: none  Estimate blood loss: none  Follow up: if the warts recur    The above plan of care, diagnosis, orders, and follow-up were discussed with the patient.  Questions related to this recommended plan of care were answered.    Follow up: Return if symptoms worsen or fail to improve.    Lendon Collar Agustine Rossitto, MD, MPH  09/07/2017 at 10:34 AM  Time of note filed does not necessarily reflect the time of encounter.

## 2017-09-24 MED ORDER — VITAMIN D2 (ERGOCALCIFEROL) 50000 UNITS PO CAPS
50000 [IU] | ORAL_CAPSULE | ORAL | 1 refills
Start: 2017-09-24 — End: ?

## 2017-10-05 MED ORDER — VITAMIN D2 (ERGOCALCIFEROL) 50000 UNITS PO CAPS
50000 [IU] | ORAL_CAPSULE | ORAL | 1 refills | Status: AC
Start: 2017-10-05 — End: 2018-03-05

## 2017-10-07 ENCOUNTER — Ambulatory Visit: Payer: MEDICARE

## 2017-10-07 DIAGNOSIS — K429 Umbilical hernia without obstruction or gangrene: Secondary | ICD-10-CM

## 2017-10-07 NOTE — Progress Notes
[facility] INTERNAL MEDICINE & PEDIATRICS - Office Note  [facility]  [address]  [place] [address]  Phone: [phone]  FAX: [phone]  [phone]     Subjective:   CC:  Abd pain    HPI:  Dustin Watts is a 61 y.o. male who presents for the following issue(s).    1. Abd pain  - chronic  - infrequent, mostly stable  - briefly painful minutes, self resolves  - mostly LLQ but sometimes around the umbilicus  - no diarrhea, constipation  - history of gall stones seen on imaging  - history of umbilical hernia, small    2. Warts  Timing (onset/duration) - chronic, months  Location - distal sole of L foot  Severity - improved since past cryotherapy  Modifying factors - cryotherapy 1 month ago      Review of Systems  No rash, no fevers    Past Medical History  The following past medical history was reviewed with the patient.  He has a past medical history of Acne; Arthritis; Cerebral palsy (HCC/RAF); Eczema; Hypertension; Impaired glucose tolerance (03/23/2016); Low vitamin D level (05/07/2016); Seizure (HCC/RAF) (1973); and Shortening of arm, congenital.    Medications/Supplements  The following medication list was reviewed with the patient.    Current Outpatient Prescriptions:   ???  benzoyl peroxide-erythromycin 5-3% gel, Apply to acne once daily.., Disp: 46.6 g, Rfl: 11  ???  DILANTIN 100 MG ER capsule, TAKE 4 CAPSULES BY MOUTH AT BEDTIME, Disp: , Rfl: 12  ???  ergocalciferol 50000 units capsule, Take 1 capsule (50,000 Units total) by mouth every seven (7) days for 24 doses., Disp: 12 capsule, Rfl: 1  ???  ibuprofen 800 mg tablet, Take 1 tablet (800 mg total) by mouth every twelve (12) hours as needed for Moderate Pain (Pain Scale 4-6)., Disp: 60 tablet, Rfl: 0  ???  RETIN-A MICRO PUMP 0.04 % gel, Apply topically at bedtime., Disp: 50 g, Rfl: 11  ???  sulfacetamide-sulfur 9-4.5 % LIQD liquid, Apply to face twice daily., Disp: 454 g, Rfl: 11    Objective: Physical Exam  BP 143/85  ~ Pulse 88  ~ Temp 36.1 ???C (97 ???F) (Oral)  ~ Resp 15  ~ Ht 1.829 m (6')  ~ Wt 95.1 kg (209 lb 10.5 oz)  ~ SpO2 97%  ~ BMI 28.43 kg/m???    Wt Readings from Last 3 Encounters:   10/07/17 95.1 kg (209 lb 10.5 oz)   09/07/17 93.7 kg (206 lb 9.1 oz)   08/20/17 93 kg (205 lb)   Constitutional - alert, no distress, overweight  Eyes - clear conjunctiva, no discharge  Ears, Nose, Mouth and Throat - lips normal, mucosa normal  Neck - supple, midline trachea  Gastrointestinal - soft, nontender on deep palpation throughout.  Small reducible umbilical hernia noted.  Skin - normal texture, no rash. Two small verrucae on plantar aspect at base of 2nd and 3rd toes  Neuro - no gross focal deficits other than chronic weakness in the R arm/leg, normal speech    Labs  I personally reviewed the following labs  Lab Results   Component Value Date    NA 143 05/27/2017    K 4.9 05/27/2017    CL 103 05/27/2017    CO2 24 05/27/2017    BUN 11 05/27/2017    CREAT 0.62 05/27/2017    GLUCOSE 104 (H) 05/27/2017    CALCIUM 9.1 05/27/2017  ALT 24 05/27/2017    AST 24 05/27/2017    ALKPHOS 111 05/27/2017    BILITOT 0.3 05/27/2017       Assessment/Plan:       ICD-9-CM ICD-10-CM    1. LLQ abdominal pain 789.04 R10.32    2. Umbilical hernia without obstruction and without gangrene 553.1 K42.9    3. Gall bladder stones 574.20 K80.20    4. Plantar wart of left foot 078.12 B07.0    5. White coat syndrome without hypertension 796.2 R03.0        Problem   White Coat Syndrome Without Hypertension    HTN diagnosis in the past, lip swelling with carvedilol and amlodipine, rash with losartan, sulfa allergy with HCTZ.  10/07/2017- mildly elevated on office reading, but 3 months of reviewing home records indicates no diagnosis of HTN.  Advised bring in home cuff at next visit to ensure correlation.     Plantar Wart of Left Foot    09/07/2017- two on plantar aspect at base of 2nd and 3rd toes.  Cryotherapy again today.  F/u in 1 month 10/07/2017 -cryotherapy again to each warts, f/u if no complete resolution     Umbilical Hernia    05/27/2017 - reducible, unlikely to be the main cause of the abd pain, unless becomes symptoms does not require further workup at this time, will monitor.  10/07/2017 - counseled on indications for additional imaging or surgical management, advised f/u if any worsening.     Gall Bladder Stones    05/27/2017 - noted on ER discharge paperwork, patient unsure about specifics in the ER (cannot remember them completing [place].)  Will repeat US, and unless has symptoms consistent with GB disease will only need monitoring and counseling on weight loss.  10/07/2017- recent abd pain is not in the GB area, will monitor for now without additional workup or therapy for the stones.     Llq Abdominal Pain    10/07/2017- infrequent, brief, mild pain, possibly secondary to stool although no constipation recently, possible secondary to spicy foods, possibly secondary to small umbilical hernia.  As brief and mild, will monitor for now without intervention, f/u if worsens.         Procedure: Wart cryotherapy  Indication: warts sole of L foot  Time out performed.  Patient gave verbal permission for the procedure.  The warts were treated with cryotherapy using a liquid nitrogen spray gun; about 10 sprays were applied directly to the two warts.  Patient tolerated the procedure well.  Complications: none  Estimate blood loss: none  Follow up: if the wart recurs    The above plan of care, diagnosis, orders, and follow-up were discussed with the patient.  Questions related to this recommended plan of care were answered.    Follow up: Return if symptoms worsen or fail to improve.    Lendon Collar Vic Blackbird, MD, MPH  10/07/2017 at 11:54 AM  Time of note filed does not necessarily reflect the time of encounter.

## 2017-11-04 ENCOUNTER — Ambulatory Visit: Payer: MEDICARE

## 2017-11-15 NOTE — Progress Notes
RHEUMATOLOGY OUTPATIENT CONSULTATION  PATIENT: Dustin Watts MRN: [id number] DOB: 10-Aug-1956  REASONS FOR VISIT/CHIEF COMPLAINT:OA      HPI:61 y.o. male former pt of Dr Roselee Nova with generalized OA, OK knees here to establish care.  Hx of epilepsy, pre-diabetic, HTN, HLD, vit D deficiency    Current visit patient reports the followings:-   -last visit increasing knee pain XR R knee good L knee mild OA  - doing well; no seizure any longer  - joints are not bad; no pain with walking; no need to take ibuprofen regularly; takes infrequently- a day before and on day out e[place]g going [place]; requesting refill; no problem no GERD/taking with food    08/2017  -last visit 02/2017 joints are doing fine on prn NSAID/post CVA  -knees are doing not too badly ''only after long walk'' ''described as blown up'', keen to have refill ibuprofen 800 mg PRN given by Dr Roselee Nova in past ''I only take it if I have a long walk to do so taking infrequently''; no stiffness; at times puffy not today; slight worse lately with pain   -other joints are fine  -chronic R sided atrophy since borne Dx cerebral palsy otherwise in good health  -no seizure for some time now      No known history of malar rash, photosensitivity rash, discoid lesions, oral/nasal ulcers,  pleuritic chest pain, seizures/psychosis, Raynaud's, skin tightness, dry eyes/mouth, hair loss or blood clots.  No history of muscle pain or proximal myopathy.  No history for fever, weight loss, loss of appetite, night sweat or lymphadenopathy.  No history of skin psoriasis, eye issues, STDs or IBD.    No recent travelling.  No sick contacts.    Prior medication/Joint injections/Immune suppressant therapy:  None    ROS:  A 14 point ROS was obtained and significant for what is mentioned in HPI and below.    ROS    (Check if Normal, or note + findings):   []    reviewed questionnaire on 11/14/2017    []   Not Obtainable :   [x]    Constit : [-] fever, [-] weight loss    [x]  Skin :[-] vasculitic palpable rash, [-] tender nodules, [-]scaling plaques      [x]    Eyes : [-] photophobia, [-] red eye [-] gritty eyes    [x]    CV :[-] LE swelling, orthopnea, [-] PND,    []    MS :     [x]    ENT: [-] ear /nose swelling, [-] throat pain or post nasal d/c   [x]    GI :[-] constipation [-] diarrhea  [-] melena [-]heartburn,   [x]    Heme/Lymph :[-] easy bruising or bleeding      [x]   Resp : [-] DOE, [-] wheezing [-] pleurisy   [x]    GU:[-]dysruria, [-] gross hematuria   []    Neuro :[-] focal neurological [-] wrist or foot drop     [x]    Imm/All :[-] recurrent infections in past, [-] pollen or seasonal allergy   [x]   Endo :   [x]    Psych: [-] mood, [-] memory     PMHx:  Past Medical History:   Diagnosis Date   ??? Acne    ??? Arthritis    ??? Cerebral palsy (HCC/RAF)    ??? Eczema    ??? Hypertension    ??? Impaired glucose tolerance 03/23/2016    HGBA1C of 5.7 on 03/19/2016    ??? Low vitamin D level  05/07/2016    05/07/2016 - taking antiepiletpic which may exacerbate it, started on ergo 50000, will recheck level in about 2 months    ??? Seizure (HCC/RAF) 1973    chronically on Dilantin, no seizure in 25 years, followed by Neurologist (Dr. Wiliam Ke) at [facility]   ??? Shortening of arm, congenital     right arm       PSHx:  Past Surgical History:   Procedure Laterality Date   ??? rectal abscess  2010    surgery done at [facility]       Meds:    Medications that the patient states to be currently taking   Medication Sig   ??? benzoyl peroxide-erythromycin 5-3% gel Apply to acne once daily.Marland Kitchen   ??? DILANTIN 100 MG ER capsule TAKE 4 CAPSULES BY MOUTH AT BEDTIME   ??? ergocalciferol 50000 units capsule Take 1 capsule (50,000 Units total) by mouth every seven (7) days for 24 doses.   ??? ibuprofen 800 mg tablet Take 1 tablet (800 mg total) by mouth every twelve (12) hours as needed for Moderate Pain (Pain Scale 4-6).   ??? RETIN-A MICRO PUMP 0.04 % gel Apply topically at bedtime. ??? sulfacetamide-sulfur 9-4.5 % LIQD liquid Apply to face twice daily.       Allergies:  Allergies   Allergen Reactions   ??? Sulfa Antibiotics Angioedema     No anaphylaxis   ??? Amlodipine Other (See Comments)     Lip swelling without anaphylaxis   ??? Carvedilol Angioedema     Lip swelling without anaphylaxis   ??? Losartan Rash     No hive, no anaphylaxis, unclear if truly has rash from this medicine   ??? Penicillins Rash     ''bumps on body''       SocHx:  Social History   Substance Use Topics   ??? Smoking status: Never Smoker   ??? Smokeless tobacco: Never Used   ??? Alcohol use No     Works in Loss adjuster, chartered; single    FamHx:  Family History   Problem Relation Age of Onset   ??? Heart disease Mother    ??? Diabetes Mother    ??? Kidney disease Mother    ??? Stroke Brother        Physical Exam:    BP 134/83  ~ Pulse 70  ~ Temp 36.4 ???C (97.5 ???F) (Oral)  ~ Resp 18  ~ Ht 6' (1.829 m)  ~ Wt 209 lb (94.8 kg)  ~ SpO2 97%  ~ BMI 28.35 kg/m???      BP 128/72  ~ Pulse 70  ~ Temp 36.4 ???C (97.5 ???F) (Oral)  ~ Resp 16  ~ Ht 6' (1.829 m)  ~ Wt 205 lb (93 kg)  ~ SpO2 97%  ~ BMI 27.80 kg/m???     Wt Readings from Last 3 Encounters:   10/07/17 209 lb 10.5 oz (95.1 kg)   09/07/17 206 lb 9.1 oz (93.7 kg)   08/20/17 205 lb (93 kg)     BP Readings from Last 3 Encounters:   10/07/17 143/85   09/07/17 132/74   08/20/17 128/72       NAD  No ocular injection  No alopecia  OP clear  No LAD  CTAB  RRR  S/NT/ND  No C/C/E  No rashes  No telangectasia/discoloration/calcinosis/sclerodactyly  No tender joints  No swollen joints  L knee crepitus+ no effusion  No enthesitis  No dactylitis  No  myofascial pain  Neuro grossly intact except R extremities atrophy  Peripheral pulses intact      RIGHT         LEFT   TMJ [] []   ~ [] []  TMJ   Shoulder [] []  ~ [] []  Shoulder                Left box: Tender   Elbow [] []  ~ [] []  Elbow               Right box: Swollen   Wrist [] []  ~ [] []  Wrist   CMC1 [] []  ~ [] []  CMC1       MCP5 MCP4 MCP3 MCP2 MCP1         ~ MCP1 MCP2 MCP3 MCP4 MCP5 [] []  [] []  [] []  [] []  [] []   [] []  [] []  [] []  [] []  [] []        PIP5 PIP4 PIP3 PIP2 IP1 ~ IP1 PIP2 PIP3 PIP4 PIP5       [] []  [] []  [] []  [] []  [] []   [] []  [] []  [] []  [] []  [] []        DIP5 DIP4 DIP3 DIP2  ~  DIP2 DIP3 DIP4 DIP5       [] []  [] []  [] []  [] []     [] []  [] []  [] []  [] []       Sacroiliac []  ~             []  Sacroiliac                Hip  [] []  ~        [] []  Hip                  Knee  [] []  ~        [] []  Knee        Ankle    [] []  ~        [] []  Ankle           MTP5 MTP4 MTP3 MTP2 MTP1 ~ MTP1 MTP2 MTP3 MTP4 MCP5       [] []  [] []  [] []  [] []  [] []   [] []  [] []  [] []  [] []  [] []        Toe5 Toe4 Toe3 Toe2 Toe1 ~ Toe1 Toe2 Toe3 Toe4 Toe5        [] []  [] []  [] []  [] []  [] []   [] []  [] []  [] []  [] []  [] []                      Labs:  Lab Results   Component Value Date    WBC 5.08 05/27/2017    HGB 14.3 05/27/2017    HCT 42.8 05/27/2017    PLT 163 05/27/2017    NEUTABS 4.0 11/28/2014    LYMPHABS 1.7 11/28/2014     Lab Results   Component Value Date    GLUCOSE 104 (H) 05/27/2017    GFREST >89 04/18/2014    CREAT 0.62 05/27/2017    CALCIUM 9.1 05/27/2017    TOTPRO 7.6 05/27/2017    ALBUMIN 4.7 05/27/2017    AST 24 05/27/2017    ALT 24 05/27/2017    ALKPHOS 111 05/27/2017     @RRCOAGS @  Lab Results   Component Value Date    TSH 1.5 01/21/2017       Rheum labs:  Lab Results   Component Value Date    SRWEST 13 (H) 05/27/2017    SRWEST 15 (H) 11/28/2014     No results found for: CARDLIPIGA, CARDLIPIGG, CARDLIPIGM, ANAAB, ANAABTITER, B2GLYPROIGA, B2GLYPROIGG, B2GLYPROIGM, DRVVT, RNPAB, SMAB, SSA  No results found for: C3, C4, DSDNAAB, NDNAABIFA  No results found for: CANCA, PANCA, PROT3AB, MPOAB  Lab Results  Component Value Date    CKTOT 68 04/23/2008    URICACID 3.6 11/28/2014     Lab Results   Component Value Date    VITD25OH 45 08/04/2017    VITD25OH 31 02/25/2017     UA:   Lab Results   Component Value Date    PROTCLUR Trace 04/23/2008    BLDUR Negative 04/23/2008    LEUKESTUR Negative 04/23/2008    RBCSUR 5 04/23/2008    WBCSUR 3 04/23/2008 Studies: [x] reviewed [] ordered [] independently reviewed.    Imaging:    Knees XR 08/2017   Mild osteoarthritis of the left knee.  No significant arthritis of the right knee.    Knees 2007 normal     ASSESSMENT AND PLAN  CORRIE BRANNEN is a 62 y.o. male     Knees OA stable on prn prn ibuprofen 800 mg as needed; no GERD; pt takes rarely; recent slight worseing  Hx of epilepsy stable   pre-diabetic, HTN, HLD  Hx vit D deficiency WNL 02/2017  overweight    RECOMMENDATIONS  - Refill ibuprofen 800 mg as PRN for knee pain/sied effects discussed  - continue to loose weight  - explain in detail and all questions answered:- Repeat Knees XR mild OA L knee/if worsening consider CSI      RTC  6 months or sooner as needed.  CC note to PCP/referred physician    Orders Placed This Encounter   ??? ibuprofen 800 mg tablet           IMMUNIZATION STATUS  Immunization History   Administered Date(s) Administered   ??? Tdap 03/05/2016   ??? influenza vaccine IM quadrivalent (Fluarix Quad) (PF) SYR (22 months of age and older) 08/04/2017   ??? influenza vaccine IM quadrivalent (Fluzone Quad) (PF) SYR/SDV (82 years of age and older) 08/06/2016     Thank you for allowing me to participate in this patient's care.  If I can be of further service, please contact my office at  [phone].  [x]  >50%  of this * minute visit was spent in face-to-face evaluation and counseling the patient.  The above plan of care, diagnosis, orders, and follow-up were discussed with the patient. Questions related to this recommended plan of care were answered  []  Immunosuppression drugs changed. [x]  Risks/benefits/toxicities of meds discussed.   []  Reviewed and summarized old records. []  Requested outside medical records.  []  Discussed with other providers of complex medical conditions and management plans outlined above    Liana Gerold MD Rheumatology Division,  [phone])

## 2017-11-18 ENCOUNTER — Ambulatory Visit: Payer: MEDICAID

## 2017-11-19 ENCOUNTER — Ambulatory Visit: Payer: BLUE CROSS/BLUE SHIELD

## 2017-11-19 DIAGNOSIS — M170 Bilateral primary osteoarthritis of knee: Secondary | ICD-10-CM

## 2017-11-19 DIAGNOSIS — G40909 Epilepsy, unspecified, not intractable, without status epilepticus: Secondary | ICD-10-CM

## 2017-11-19 MED ORDER — IBUPROFEN 800 MG PO TABS
800 mg | ORAL_TABLET | Freq: Two times a day (BID) | ORAL | 5 refills | Status: AC | PRN
Start: 2017-11-19 — End: 2018-09-17

## 2017-11-19 NOTE — Patient Instructions
MEDICATION: IBUPROFEN (Adult)  Ibuprofen (brand name: Motrin) is an anti-inflammatory drug. It is also available over the counter in a lower strength as Advil, Motrin IB, and other brand names. It is very useful for pain, fever, and inflammation.  DIRECTIONS FOR USE:  You may take this medicine with food or milk to reduce stomach upset.  WHAT TO WATCH FOR:  POSSIBLE SIDE EFFECTS: Nausea, upper abdominal pain, dizziness (Contact your doctor if these symptoms persist or become severe). Bleeding from the stomach, which may appear as blood in vomit or stool (red or black color); rapid weight gain, leg swelling or easy bruising, change in the amount of urine, confusion/mood changes, very stiff neck, yellowing of the eyes/skin (Contact your doctor or return to this facility promptly).  ALLERGIC REACTION: Rash, itching, swelling, trouble breathing or swallowing (Contact your doctor or return to this facility promptly).  MEDICAL CONDITIONS: Before starting this medicine, be sure your doctor knows if you have any of the following conditions:   Stomach ulcer (active or in the past), history of vomiting blood or bloody stool   Chronic alcoholism, or if you consume more than 3 alcoholic drinks daily   Allergic reaction to aspirin or other anti-inflammatory medicines   Asthma, nasal polyps, or angioedema; pregnancy or breastfeeding   Liver or kidney disease; bleeding disorder, diabetes  DRUG INTERACTION: Before starting this medicine, be sure your doctor knows if you are taking any of the following drugs:   Blood thinners [Coumadin (warfarin), low molecular weight heparins, heparin], water pills [Lasix (furosemide), hydrochlorothiazide, Dyazide], blood pressure medicine, diabetes pills, corticosteroids [methylprednisolone, prednisone], aspirin or other anti-inflammatory drugs, Lanoxin (digoxin), methotrexate, cyclosporine, cidofovir, antiplatelet drugs such as Pletal (cilostazol) or Plavix (clopidogrel), desmopressin,  drugs for osteoporosis [Fosamax (alendronate)], lithium, pemetrexed, probenecid, bile acid binding resins, drotrecogin, SSRI antidepressants (fluoxetine, sertraline)  WARNINGS:   Do not take with prednisone, other anti-inflammatory drugs, or alcohol since this increases the risk of getting a bleeding ulcer.  This drug may cause dizziness. Do not drive, ride a bicycle, or operate dangerous equipment while taking this medicine until you know how it will affect you.  [NOTE: This information topic may not include all directions, precautions, medical conditions, drug/food interactions, and warnings for this drug. Check with your doctor, nurse, or pharmacist for any questions that you may have.]   2000-2012 [employer], [address], [place] [address]. All rights reserved. This information is not intended as a substitute for professional medical care. Always follow your healthcare professional's instructions.

## 2017-11-24 MED ORDER — IBU 800 MG PO TABS
800 mg | ORAL_TABLET | Freq: Four times a day (QID) | ORAL | 0 refills | Status: AC
Start: 2017-11-24 — End: 2018-09-09

## 2017-11-25 ENCOUNTER — Ambulatory Visit: Payer: Medicaid HMO

## 2017-12-02 ENCOUNTER — Ambulatory Visit: Payer: BLUE CROSS/BLUE SHIELD

## 2017-12-02 NOTE — Progress Notes
[facility] INTERNAL MEDICINE & PEDIATRICS - Office Note  [facility]  [address]  [place] [address]  Phone: [phone]  FAX: [phone]  [phone]     Subjective:   CC:  Hypertension (1 mo f/u) and Foot pain (The bottom of his left)    HPI:  Dustin Watts is a 62 y.o. male who presents for the following issue(s).    1. Plantar wart  Timing (onset/duration) - months, since this summer  Location - bottom L foot  Severity - mild tenderness, especially at night recently, no long pain when walking  Quality - dry skin over it  Context - diagnosed with plantar wart in the past  Modifying factors - cryotherapy 3 times in the past  Associated signs and symptoms - no other rash    2. HTN follow-up (white coat?)  - chronic concern of elevated BP  - patient is currently taking no BP medicines, has tried multiple in the past  - patient described adherence to regimen  - home BP results in the range of 110-120's over 60-70's, multiple days, mulitple times in the day, recorded in a small notebook  - ROS neg including no HA, CP, SOB, numbness, weakness, vision changes  - denies anxiety - see below  - Last EKG in 2017  Lab Results   Component Value Date    ALBCREATUR 8.0 01/21/2017     Lab Results   Component Value Date    CREAT 0.62 05/27/2017     PHQ9 Questionnaire  Little interest or pleasure in doing things: Not at all-Little interest or pleasure in doing things: 0  Feeling down, depressed, or hopeless: Not at all-Feeling down, depressed, or hopeless: 0  Trouble falling or staying asleep, or sleeping too much: Not at all-Trouble falling or staying asleep, or sleeping too much: 0  Feeling tired or having little energy: Not at all-Feeling tired or having little energy: 0  Poor appetite or overeating: Not at all-Poor appetite or overeating: 0  Feeling bad about yourself - or that you are a failure or have let yourself or your family down: Not at all-Feeling bad about yourself - or that you are a failure or have let yourself or your family down: 0  Trouble concentrating on things, such as reading the newspaper or watching television: Not at all-Trouble concentrating on things, such as reading the newspaper or watching television: 0  Moving or speaking so slowly that other people could have noticed. Or the opposite - being so fidgety or restless that you have been moving around a lot more than usual: Not at all-Moving or speaking so slowly that other people could have noticed. Or the opposite - being so fidgety or restless that you have been moving around a lot more than usual: 0  Thoughts that you would be better off dead, or of hurting yourself in some way: Not at all-Thoughts that you would be better off dead, or of hurting yourself in some way: 0  PHQ-9 Total Score: 0     GAD-7 12/02/2017   Feeling nervous, anxious, or on edge 0-Not at all   Not being able to stop or control worrying 0-Not at all   Worrying too much about different things 1-Several days   Trouble relaxing 1-Several days   Being so restless that is hard to sit still 1-Several days   Becoming easily annoyed or irritable 1-Several days   Feeling afraid, as  if something awful might happen 0-Not at all   Total score 4           Review of Systems  As above in the HPI and also a 14 point review of systems was reviewed with the patient (see document filled out by the patient today scanned into [employer]).    Past Medical History  The following past medical history was reviewed with the patient.  He has a past medical history of Acne; Arthritis; Cerebral palsy (HCC/RAF); Eczema; Hypertension; Impaired glucose tolerance (03/23/2016); Low vitamin D level (05/07/2016); Seizure (HCC/RAF) (1973); and Shortening of arm, congenital.    Medications/Supplements  The following medication list was reviewed with the patient.    Current Outpatient Prescriptions: ???  benzoyl peroxide-erythromycin 5-3% gel, Apply to acne once daily.., Disp: 46.6 g, Rfl: 11  ???  DILANTIN 100 MG ER capsule, TAKE 4 CAPSULES BY MOUTH AT BEDTIME, Disp: , Rfl: 12  ???  ergocalciferol 50000 units capsule, Take 1 capsule (50,000 Units total) by mouth every seven (7) days for 24 doses., Disp: 12 capsule, Rfl: 1  ???  IBU 800 MG tablet, TAKE 1 TABLET (800 MG TOTAL) BY MOUTH FOUR (4) TIMES DAILY., Disp: 150 tablet, Rfl: 0  ???  ibuprofen 800 mg tablet, Take 1 tablet (800 mg total) by mouth every twelve (12) hours as needed for Moderate Pain (Pain Scale 4-6)., Disp: 60 tablet, Rfl: 5  ???  RETIN-A MICRO PUMP 0.04 % gel, Apply topically at bedtime., Disp: 50 g, Rfl: 11  ???  sulfacetamide-sulfur 9-4.5 % LIQD liquid, Apply to face twice daily., Disp: 454 g, Rfl: 11    Objective:   Physical Exam  BP (!) 151/99 Comment: Taken twice w/ large BP cuff ~ Pulse 96  ~ Temp 36.7 ???C (98 ???F) (Oral)  ~ Resp 16  ~ Ht 1.829 m (6')  ~ Wt 94.8 kg (209 lb)  ~ SpO2 98%  ~ BMI 28.35 kg/m???    Manual BP on L arm sitting quiety for 5 minutes was 144/95 which is roughly the same result obtained multiple times with home BP cuff done in the office today (he brought in his home cuff)  Constitutional - alert, no distress  Eyes - clear conjunctiva, no discharge  Ears, Nose, Mouth and Throat - lips normal, mucosa normal  Neck - supple, midline trachea  Skin - normal texture, no rash other than on ly mild rough skin on the distal central plantar aspect of the L foot without nodules noted on deep palpation  Neuro - no gross focal deficits, normal speech  Psychiatric - normal insight and judgement, normal mental status    Labs  I personally reviewed the following labs  Lab Results   Component Value Date    NA 143 05/27/2017    K 4.9 05/27/2017    CL 103 05/27/2017    CO2 24 05/27/2017    BUN 11 05/27/2017    CREAT 0.62 05/27/2017    GLUCOSE 104 (H) 05/27/2017    CALCIUM 9.1 05/27/2017    ALT 24 05/27/2017    AST 24 05/27/2017 ALKPHOS 111 05/27/2017    BILITOT 0.3 05/27/2017     Lab Results   Component Value Date    WBC 5.08 05/27/2017    HGB 14.3 05/27/2017    HCT 42.8 05/27/2017    MCV 101.2 (H) 05/27/2017    PLT 163 05/27/2017     Lab Results   Component Value Date    HGBA1C 5.9 (  H) 08/04/2017     Lab Results   Component Value Date    VITD25OH 45 08/04/2017     Lab Results   Component Value Date    CHOL 243 08/04/2017    CHOLHDL 63 08/04/2017    CHOLDLCAL 149 (H) 08/04/2017    TRIGLY 156 (H) 08/04/2017         Assessment/Plan:       ICD-9-CM ICD-10-CM    1. Plantar wart of left foot 078.12 B07.0    2. White coat syndrome without hypertension 796.2 R03.0    3. Low vitamin D level 790.6 R79.89    4. Elevated cholesterol with elevated triglycerides 272.2 E78.2    5. LLQ abdominal pain 789.04 R10.32          Problem   White Coat Syndrome Without Hypertension    HTN diagnosis in the past, lip swelling with carvedilol and amlodipine, rash with losartan, sulfa allergy with HCTZ.  12/02/2017- home BP cuff correlated with manual BP in the clinic.  Home BP records are reassuring, nearly all normotensive.  Therefore the diagnosis of white coat syndrome without hypertension is accurate and no BP meds indicated at this time.  Continue home monitoring for now.     Plantar Wart of Left Foot    09/07/2017- two on plantar aspect at base of 2nd and 3rd toes.  Cryotherapy again today.  F/u in 1 month  10/07/2017 -cryotherapy again to each warts, f/u if no complete resolution  12/02/2017- no nodule or wart noted on exam today but patient has some sensation in that area at night.  Advised continued pupmice stone and soaks, f/u if worsens     Elevated Cholesterol With Elevated Triglycerides    01/21/2017 - elevated lipids again 2 months ago (ASCVD score about 10%) but patient declines new   06/03/2017 - ASCVD score >7.5, would benefit from statin but patient declines, discussed benefits/risks again including the risk of heart disease and death, and patient prefers to continue with exercise and diet changes.    12/02/2017- ASCVD score still elevated, discussed risks again as shown above, patient declines again, will monitor every 6 months or so.  Counseled on diet/exercise again.     Low Vitamin D Level    taking antiepiletpic which may decrease vit D level  04/01/2017 - adequate level, continue weekly supplement for now.  Recheck in 3-6 months.  07/01/2017 - refilled today, will check next lab draw  12/02/2017 - adequate level on current supplementation of ergo 50k weekly, no change at this time     Llq Abdominal Pain (Resolved)    10/07/2017- infrequent, brief, mild pain, possibly secondary to stool although no constipation recently, possible secondary to spicy foods, possibly secondary to small umbilical hernia.  As brief and mild, will monitor for now without intervention, f/u if worsens.  12/02/2017 -currently without abd pain, will resolve this issue and counseled patient on healthy dietary habits.         The above plan of care, diagnosis, orders, and follow-up were discussed with the patient.  Questions related to this recommended plan of care were answered.    Follow up: prn    Lendon Collar. Vic Blackbird, MD, MPH  12/02/2017 at 10:17 AM  Time of note filed does not necessarily reflect the time of encounter.

## 2017-12-04 MED ORDER — ASPIRIN ADULT LOW STRENGTH 81 MG PO TBEC
81 mg | ORAL_TABLET | Freq: Every day | ORAL | 3 refills | Status: AC
Start: 2017-12-04 — End: 2018-12-21

## 2018-01-06 ENCOUNTER — Ambulatory Visit: Payer: MEDICAID

## 2018-01-10 ENCOUNTER — Ambulatory Visit: Payer: BLUE CROSS/BLUE SHIELD

## 2018-01-10 DIAGNOSIS — G40909 Epilepsy, unspecified, not intractable, without status epilepticus: Secondary | ICD-10-CM

## 2018-01-10 DIAGNOSIS — R9402 Abnormal brain scan: Secondary | ICD-10-CM

## 2018-01-10 NOTE — Progress Notes
[facility] INTERNAL MEDICINE & PEDIATRICS - Office Note  [facility]  [address]  [place] [address]  Phone: [phone]  FAX: [phone]  [phone]     Subjective:   CC:  Follow-up (72-month follow up)    HPI:  Dustin Watts is a 62 y.o. male who presents for the following issue(s).    1. Foot problems  Timing (onset/duration) - noted in the last few weeks  Location - center of the distal L plantar aspect  Severity - mild discomfort when pressing hard  Context - does not remember a splinter or lesion  Modifying factors - has not treated this, but has treated other plantar warts here recently, and has been using pumice stones for removal recently    2. Blood pressure  - home BP results in the 120-130 range over 70-80 range on multiple measurments at home  - asymptomatic without headache, vision change, nausea, new weakness or numbness    3. Overweight  - chronic  - recent weight gain  - dietary indiscretions    4. Predibaetes  - chronic  - poor recent diet  - baseline level of exercise    Review of Systems  As above in the HPI and also a 14 point review of systems was reviewed with the patient (see document filled out by the patient today scanned into [employer]).    Past Medical History  The following past medical history was reviewed with the patient.  He has a past medical history of Acne; Arthritis; Cerebral palsy (HCC/RAF); Eczema; Hypertension; Impaired glucose tolerance (03/23/2016); Low vitamin D level (05/07/2016); Seizure (HCC/RAF) (1973); and Shortening of arm, congenital.    Medications/Supplements  The following medication list was reviewed with the patient.    Current Outpatient Prescriptions:   ???  ASPIRIN ADULT LOW STRENGTH 81 MG EC tablet, TAKE 1 TABLET (81 MG TOTAL) BY MOUTH DAILY., Disp: 90 tablet, Rfl: 3  ???  benzoyl peroxide-erythromycin 5-3% gel, Apply to acne once daily.., Disp: 46.6 g, Rfl: 11 ???  DILANTIN 100 MG ER capsule, TAKE 4 CAPSULES BY MOUTH AT BEDTIME, Disp: , Rfl: 12  ???  ergocalciferol 50000 units capsule, Take 1 capsule (50,000 Units total) by mouth every seven (7) days for 24 doses., Disp: 12 capsule, Rfl: 1  ???  IBU 800 MG tablet, TAKE 1 TABLET (800 MG TOTAL) BY MOUTH FOUR (4) TIMES DAILY., Disp: 150 tablet, Rfl: 0  ???  ibuprofen 800 mg tablet, Take 1 tablet (800 mg total) by mouth every twelve (12) hours as needed for Moderate Pain (Pain Scale 4-6)., Disp: 60 tablet, Rfl: 5  ???  RETIN-A MICRO PUMP 0.04 % gel, Apply topically at bedtime., Disp: 50 g, Rfl: 11  ???  sulfacetamide-sulfur 9-4.5 % LIQD liquid, Apply to face twice daily., Disp: 454 g, Rfl: 11    Objective:   Physical Exam  BP (!) 144/94 Comment: Right arm ~ Pulse 95  ~ Temp 36.7 ???C (98.1 ???F) (Tympanic)  ~ Wt 96.6 kg (213 lb)  ~ SpO2 99%  ~ BMI 28.89 kg/m???    Wt Readings from Last 3 Encounters:   01/10/18 96.6 kg (213 lb)   12/02/17 94.8 kg (209 lb)   11/19/17 94.8 kg (209 lb)   Constitutional - alert, no distress, overweight  Eyes - clear conjunctiva, no discharge  Neck - supple, midline trachea  Skin - normal texture, no rash, pinpoint dark lesion deep in the distal  central area of the L plantar aspect with mild nodular quality on palpation  Neuro - no gross focal deficits, normal speech  Psychiatric - normal insight and judgement, normal mental status    Labs  I personally reviewed the following labs  Lab Results   Component Value Date    VITD25OH 45 08/04/2017     Lab Results   Component Value Date    NA 143 05/27/2017    K 4.9 05/27/2017    CL 103 05/27/2017    CO2 24 05/27/2017    BUN 11 05/27/2017    CREAT 0.62 05/27/2017    GLUCOSE 104 (H) 05/27/2017    CALCIUM 9.1 05/27/2017    ALT 24 05/27/2017    AST 24 05/27/2017    ALKPHOS 111 05/27/2017    BILITOT 0.3 05/27/2017     Lab Results   Component Value Date    WBC 5.08 05/27/2017    HGB 14.3 05/27/2017    HCT 42.8 05/27/2017    MCV 101.2 (H) 05/27/2017    PLT 163 05/27/2017 Lab Results   Component Value Date    TSH 1.5 01/21/2017     Lab Results   Component Value Date    HGBA1C 5.9 (H) 08/04/2017     Lab Results   Component Value Date    CHOL 243 08/04/2017    CHOLHDL 63 08/04/2017    CHOLDLCAL 149 (H) 08/04/2017    TRIGLY 156 (H) 08/04/2017       Assessment/Plan:       ICD-9-CM ICD-10-CM    1. Prediabetes 790.22 R73.02 Hgb A1c   2. Nonintractable epilepsy without status epilepticus, unspecified epilepsy type (HCC/RAF) 345.90 G40.909    3. Plantar wart of left foot 078.12 B07.0    4. Low vitamin D level 790.6 R79.89 Vitamin D,25-Hydroxy   5. Abnormal brain scan 794.09 R94.02          Problem   Abnormal Brain Scan    01/10/2018- per patient on unknown modality of brain scan at [facility] and unknown date (maybe 10 years ago.)  Will obtain old records as patient remembers there was finding of a ''bubble'' on the scan.     Plantar Wart of Left Foot    09/07/2017- two on plantar aspect at base of 2nd and 3rd toes.  Cryotherapy again today.  F/u in 1 month  10/07/2017 -cryotherapy again to each warts, f/u if no complete resolution  12/02/2017- no nodule or wart noted on exam today but patient has some sensation in that area at night.  Advised continued pumice stone and soaks, f/u if worsens  01/10/2018- new lesion on a different part of the foot, treated with cytotherapy today, f/u if persistence, advised pumice stone/soaks     Low Vitamin D Level    taking antiepiletpic which may decrease vit D level  01/10/2018- has been adequate level in the past, will recheck today, continue weekly supplementation for now     Prediabetes    Chronic, relatively stable  01/10/2018- recheck today, counseled on exercise     Epilepsy (Hcc/Raf)    chronically on Dilantin, no seizure in 26 years, followed by Neurologist (Dr. Wiliam Ke) at [facility]  01/10/2018 - well controlled, no change in Dilantin, continue management with neuro, recheck vit D today The above plan of care, diagnosis, orders, and follow-up were discussed with the patient.  Questions related to this recommended plan of care were answered.    Follow up: Return  if symptoms worsen or fail to improve.    Lendon Collar Vic Blackbird, MD, MPH  01/10/2018 at 12:51 PM  Time of note filed does not necessarily reflect the time of encounter.

## 2018-01-11 LAB — Hgb A1c: HGB A1C - HPLC: 5.7 — ABNORMAL HIGH (ref <5.7)

## 2018-01-11 LAB — Vitamin D,25-Hydroxy: VITAMIN D,25-HYDROXY: 45 ng/mL (ref 20–50)

## 2018-02-07 ENCOUNTER — Telehealth: Payer: MEDICAID

## 2018-02-07 NOTE — Telephone Encounter
Called both mobile and home number listed in CC. Number ending in [phone], call randomly ended. Number ending in [phone] just kept ringing with no answering machine.    Called in regards to MD not being in clinic 04/13/18. Upon patients call back, please assist with scheduling.    Thank you

## 2018-02-10 ENCOUNTER — Ambulatory Visit: Payer: MEDICAID

## 2018-02-18 ENCOUNTER — Ambulatory Visit: Payer: MEDICAID

## 2018-03-04 ENCOUNTER — Ambulatory Visit: Payer: BLUE CROSS/BLUE SHIELD

## 2018-03-04 DIAGNOSIS — Z23 Encounter for immunization: Secondary | ICD-10-CM

## 2018-03-04 NOTE — Patient Instructions
Your recent vitamin D level is normal. To maintain this level, I recommend taking an over-the-counter vitamin D supplement such as cholecalciferol 2000 international units every day, or getting about 30 minutes of direct sun daily always with sunscreen.  We can recheck the level in 6-12 months.    Please feel free to contact me through MyChart, at the [facility] office [phone]) or email me at [email or web address] if you have any questions.  My cell is [phone], and I'm always happy to hear from you although workdays I usually don't pick up because I'm with patients.  Text messages are not confidential by [facility] standards.

## 2018-03-04 NOTE — Progress Notes
[facility] INTERNAL MEDICINE & PEDIATRICS - Office Note  [facility]  [address]  [place] [address]  Phone: [phone]  FAX: [phone]  [phone]     Subjective:   CC:  Follow-up (Wart on foot)    HPI:  Dustin Watts is a 62 y.o. male who presents for the following issue(s).    1. Wart  Timing (onset/duration) - month  Location - distal L plantar surface  Severity - no significant discomfort  Modifying factors - using pumice stone at home, multiple cryotherapy in the office previously      Review of Systems  No rash, no fever, no vomiting, no diarrhea    Past Medical History  The following past medical history was reviewed with the patient.  He has a past medical history of Acne; Arthritis; Cerebral palsy (HCC/RAF); Eczema; Hypertension; Impaired glucose tolerance (03/23/2016); Low vitamin D level (05/07/2016); Seizure (HCC/RAF) (1973); and Shortening of arm, congenital.    Medications/Supplements  The following medication list was reviewed with the patient.    Current Outpatient Prescriptions:   ???  ASPIRIN ADULT LOW STRENGTH 81 MG EC tablet, TAKE 1 TABLET (81 MG TOTAL) BY MOUTH DAILY., Disp: 90 tablet, Rfl: 3  ???  benzoyl peroxide-erythromycin 5-3% gel, Apply to acne once daily.., Disp: 46.6 g, Rfl: 11  ???  DILANTIN 100 MG ER capsule, TAKE 4 CAPSULES BY MOUTH AT BEDTIME, Disp: , Rfl: 12  ???  IBU 800 MG tablet, TAKE 1 TABLET (800 MG TOTAL) BY MOUTH FOUR (4) TIMES DAILY., Disp: 150 tablet, Rfl: 0  ???  ibuprofen 800 mg tablet, Take 1 tablet (800 mg total) by mouth every twelve (12) hours as needed for Moderate Pain (Pain Scale 4-6)., Disp: 60 tablet, Rfl: 5  ???  RETIN-A MICRO PUMP 0.04 % gel, Apply topically at bedtime., Disp: 50 g, Rfl: 11  ???  sulfacetamide-sulfur 9-4.5 % LIQD liquid, Apply to face twice daily., Disp: 454 g, Rfl: 11    Objective:   Physical Exam  BP 153/83  ~ Pulse 104  ~ Temp 36.2 ???C (97.1 ???F) (Tympanic)  ~ Resp 17  ~ Ht 6' (1.829 m)  ~ Wt 217 lb (98.4 kg)  ~ SpO2 98%  ~ BMI 29.43 kg/m???   Constitutional - alert, no distress, overweight  Eyes - clear conjunctiva, no discharge  Ears, Nose, Mouth and Throat - lips normal, mucosa normal  Neck - supple, midline trachea  Skin - normal texture, no rash except for <38mm thickening on the posterior plantar aspect of the L foot without tenderness  Neuro - no gross focal deficits, normal speech  Psychiatric - normal insight and judgement, normal mental status    Labs  I personally reviewed the following labs  Lab Results   Component Value Date    NA 143 05/27/2017    K 4.9 05/27/2017    CL 103 05/27/2017    CO2 24 05/27/2017    BUN 11 05/27/2017    CREAT 0.62 05/27/2017    GLUCOSE 104 (H) 05/27/2017    CALCIUM 9.1 05/27/2017    ALT 24 05/27/2017    AST 24 05/27/2017    ALKPHOS 111 05/27/2017    BILITOT 0.3 05/27/2017     Lab Results   Component Value Date    WBC 5.08 05/27/2017    HGB 14.3 05/27/2017    HCT 42.8 05/27/2017    MCV 101.2 (H) 05/27/2017    PLT  163 05/27/2017     Lab Results   Component Value Date    HGBA1C 5.7 (H) 01/10/2018     Lab Results   Component Value Date    VITD25OH 45 01/10/2018             Assessment/Plan:       ICD-9-CM ICD-10-CM    1. Plantar wart of left foot 078.12 B07.0    2. Prediabetes 790.22 R73.02    3. Overweight 278.02 E66.3    4. Low vitamin D level 790.6 R79.89    5. Need for vaccination for measles V04.2 Z23 Measles Ab Immune Status         Problem   Plantar Wart of Left Foot    09/07/2017- two on plantar aspect at base of 2nd and 3rd toes.  Cryotherapy again today.  F/u in 1 month  10/07/2017 -cryotherapy again to each warts, f/u if no complete resolution  12/02/2017- no nodule or wart noted on exam today but patient has some sensation in that area at night.  Advised continued pumice stone and soaks, f/u if worsens  01/10/2018- new lesion on a different part of the foot, treated with cytotherapy today, f/u if persistence, advised pumice stone/soaks 03/04/2018- much improved, cryotherapy today, f/u prn     Low Vitamin D Level    taking antiepiletpic which may decrease vit D level  03/04/2018- adequate recent level, can change from weekly to daily supplementation     Prediabetes    Chronic, relatively stable  01/10/2018- recently stabe, counseled on exercise (biking)     Overweight    04/02/2016 - neg screen for OSA  03/04/2018 - counseled on diet/exercise         Procedure: Wart cryotherapy  Indication: warts (plantar)  Time out performed.  Patient gave verbal permission for the procedure.  The warts were treated with cryotherapy using a liquid nitrogen spray gun; 8 sprays were applied directly to the warts.  Patient tolerated the procedure well.  Complications: none  Estimate blood loss: none  Follow up: if the wart recurs      The above plan of care, diagnosis, orders, and follow-up were discussed with the patient.  Questions related to this recommended plan of care were answered.    Follow up: Return if symptoms worsen or fail to improve.    Lendon Collar Vic Blackbird, MD, MPH  03/04/2018 at 6:21 PM  Time of note filed does not necessarily reflect the time of encounter.  The documentation and medical complexity of this visit are consistent with a 992[address] as shown by the following:  CHIEF COMPLAINT: documented  HPI: Four elements and/or status of three chronic conditions documented  PAST HISTORY: At least one item (medical, family or social) documented  REVIEW OF SYSTEMS: At least two systems documented  EXAM: At least five organ systems documented  MEDICAL DECISION MAKING: medium complexity as shown by the following:    Problems (3 points total)  [ ]  Self-limited or minor - 1 point (2 maximum)     [ x] Established problem, stable or improving - 1 point (>3 points for the issues shown above)  [ ]  Established problem, worsening - 2 points  [ ]  New problem, with no additional work-up planned - 3 points  [ ]  New problem, with additional work-up planned - 4 points Risk level of Moderate Risk (any one of the following)  [ ]  One or more chronic illness, with mild exacerbation, progression, or side effects of treatment  [  x ] Two or more stable chronic illnesses  [ ]  Undiagnosed new problem, with uncertain prognosis  [ ]  Acute illness, with systemic symptoms  [ ]  Acute complicated injury

## 2018-03-07 LAB — Measles Ab Immune Status: MEASLES AB IMMUNE STATUS: POSITIVE

## 2018-04-01 ENCOUNTER — Ambulatory Visit: Payer: MEDICAID

## 2018-04-01 NOTE — Progress Notes
[facility] INTERNAL MEDICINE & PEDIATRICS - Office Note  [facility]  [address]  [place] [address]  Phone: [phone]  FAX: [phone]  [phone]     Subjective:   CC:  Follow-up (foot and weight)    HPI:  Dustin Watts is a 62 y.o. male who presents for the following issue(s).    1. Obesity  - chronic  - stable  - riding bike  - sit ups every morning  - no significant dietary changes  - less than 50% of diet is fruits and vegetabls    2. Plantar wart  - since Oct 2018  - L foot  - distal  - 2 spots  - >5 cryotherapy treatments  - using pumice stone at home  - mildly painful when walking barefoot  - no OTC (salicyclic acid)      Review of Systems  As above in the HPI and also a 14 point review of systems was reviewed with the patient (see document filled out by the patient today scanned into [employer]).      Past Medical History  The following past medical history was reviewed with the patient.  He has a past medical history of Acne; Arthritis; Cerebral palsy (HCC/RAF); Eczema; Hypertension; Impaired glucose tolerance (03/23/2016); Low vitamin D level (05/07/2016); Seizure (HCC/RAF) (1973); and Shortening of arm, congenital.    Medications/Supplements  The following medication list was reviewed with the patient.    Current Outpatient Prescriptions:   ???  ASPIRIN ADULT LOW STRENGTH 81 MG EC tablet, TAKE 1 TABLET (81 MG TOTAL) BY MOUTH DAILY., Disp: 90 tablet, Rfl: 3  ???  benzoyl peroxide-erythromycin 5-3% gel, Apply to acne once daily.., Disp: 46.6 g, Rfl: 11  ???  DILANTIN 100 MG ER capsule, TAKE 4 CAPSULES BY MOUTH AT BEDTIME, Disp: , Rfl: 12  ???  IBU 800 MG tablet, TAKE 1 TABLET (800 MG TOTAL) BY MOUTH FOUR (4) TIMES DAILY., Disp: 150 tablet, Rfl: 0  ???  ibuprofen 800 mg tablet, Take 1 tablet (800 mg total) by mouth every twelve (12) hours as needed for Moderate Pain (Pain Scale 4-6)., Disp: 60 tablet, Rfl: 5 ???  RETIN-A MICRO PUMP 0.04 % gel, Apply topically at bedtime., Disp: 50 g, Rfl: 11  ???  sulfacetamide-sulfur 9-4.5 % LIQD liquid, Apply to face twice daily., Disp: 454 g, Rfl: 11    Objective:   Physical Exam  BP (!) 159/98  ~ Pulse 98  ~ Temp 36.2 ???C (97.2 ???F) (Tympanic)  ~ Ht 6' (1.829 m)  ~ Wt 218 lb 3.2 oz (99 kg)  ~ SpO2 97%  ~ BMI 29.59 kg/m???    Wt Readings from Last 3 Encounters:   04/01/18 218 lb 3.2 oz (99 kg)   03/04/18 217 lb (98.4 kg)   01/10/18 213 lb (96.6 kg)   Constitutional - alert, no distress  Eyes - clear conjunctiva, no discharge  Ears, Nose, Mouth and Throat - lips normal, mucosa normal  Neck - supple, midline trachea  Skin - normal texture, no rash, has 2 pinpoint warts deep in the distal L plantar surface  Neuro - no gross focal deficits, normal speech  Psychiatric - normal insight and judgement, normal mental status    Labs  I personally reviewed the following labs  Lab Results   Component Value Date    NA 143 05/27/2017    K 4.9 05/27/2017    CL 103 05/27/2017  CO2 24 05/27/2017    BUN 11 05/27/2017    CREAT 0.62 05/27/2017    GLUCOSE 104 (H) 05/27/2017    CALCIUM 9.1 05/27/2017    ALT 24 05/27/2017    AST 24 05/27/2017    ALKPHOS 111 05/27/2017    BILITOT 0.3 05/27/2017       Assessment/Plan:       ICD-9-CM ICD-10-CM    1. Plantar wart of left foot 078.12 B07.0    2. Overweight 278.02 E66.3    3. White coat syndrome without hypertension 796.2 R03.0          Problem   White Coat Syndrome Without Hypertension    HTN diagnosis in the past, lip swelling with carvedilol and amlodipine, rash with losartan, sulfa allergy with HCTZ.  12/02/2017- home BP cuff correlated with manual BP in the clinic.  Home BP records are reassuring, nearly all normotensive.  Therefore the diagnosis of white coat syndrome without hypertension is accurate and no BP meds indicated at this time.  Continue home monitoring for now.  04/01/2018 - again elevated but home BP reassuring, will continue monitoring Plantar Wart of Left Foot    Two on plantar aspect at base of 2nd and 3rd toes.  Multiple cryotherapy since Oct 2018  04/01/2018 - again very small but still symptomatic, cryothereapy again today, f/u prn       Overweight    04/02/2016 - neg screen for OSA  04/01/2018 - no significant change, counseled on diet/exercise at length, also discussed metformin which may help with weight loss and patient to consider for the future.         Procedure: Wart cryotherapy  Indication: verruca  Site: L distal plantar surface  Time out performed.  Patient gave verbal permission for the procedure. Patient advised of risk for infection, irritation, post-inflammatory pigmentary alteration, redness, scar, and the potential need for additional treatment.   Number of lesions: 2  Treatment was performed with a 9 blade scalpel to lightly remove the top layer of calloused skin then cryotherapy using a liquid nitrogen spray gun; 5 sprays were applied directly to the lesion(s).  Patient tolerated the procedure well.  Complications: none  Estimate blood loss: none  Follow up: if the wart recurs, wound care instructions given      The above plan of care, diagnosis, orders, and follow-up were discussed with the patient.  Questions related to this recommended plan of care were answered.    Follow up: 1 month of warts recur    Lendon Collar. Vic Blackbird, MD, MPH  04/01/2018 at 6:22 PM  Time of note filed does not necessarily reflect the time of encounter.

## 2018-04-13 ENCOUNTER — Ambulatory Visit: Payer: BLUE CROSS/BLUE SHIELD

## 2018-04-22 ENCOUNTER — Ambulatory Visit: Payer: MEDICARE

## 2018-04-22 ENCOUNTER — Ambulatory Visit: Payer: MEDICAID

## 2018-04-25 MED ORDER — VITAMIN D2 (ERGOCALCIFEROL) 50000 UNITS PO CAPS
50000 [IU] | ORAL_CAPSULE | ORAL | 1 refills
Start: 2018-04-25 — End: ?

## 2018-04-26 ENCOUNTER — Telehealth: Payer: PRIVATE HEALTH INSURANCE

## 2018-04-26 NOTE — Telephone Encounter
Good Afternoon,     Pt is asking if Dr.Samras can write him a prescription of the generic brand of DILANTIN 100 MG ER capsule. He would like the prescription only for one month. Per pt the generic brand will only cost him $58 compared to the brand name for $180. He would like medication to be sent to: [id number] - [place], [place] - [address]    Pt also requested a callback from Dr.Samras or [place].  CBN#: [phone]    Thank you

## 2018-04-27 NOTE — Telephone Encounter
Spoke with pt on the phone.  He has found bottle of dilantin available for use, and does not need refill at this time.

## 2018-04-29 ENCOUNTER — Ambulatory Visit: Payer: BLUE CROSS/BLUE SHIELD

## 2018-04-29 DIAGNOSIS — H1013 Acute atopic conjunctivitis, bilateral: Secondary | ICD-10-CM

## 2018-04-29 MED ORDER — PHENYTOIN SODIUM EXTENDED 100 MG PO CAPS
100 mg | ORAL_CAPSULE | Freq: Three times a day (TID) | ORAL | 3 refills | Status: AC
Start: 2018-04-29 — End: ?

## 2018-04-29 NOTE — Patient Instructions
You likely have an allergy that is causing the redness and the discharge from your eye.  I recommend ketotifen (Zaditor) eye drops as needed for the next few days.  You can also try loratadine 10mg  as needed for allergy symptoms including itchy eyes. Both of these are available without a prescription.

## 2018-04-29 NOTE — Progress Notes
[facility] INTERNAL MEDICINE & PEDIATRICS - Office Note  [facility]  [address]  [place] [address]  Phone: [phone]  FAX: [phone]  [phone]     Subjective:   CC:  Eye Problem (Right eye drainage x 3 days) and Follow-up (Left plantar foot)    HPI:  Dustin Watts is a 62 y.o. male who presents for the following issue(s).    1. Eye discharge  Timing (onset/duration) - in the last 3 days  Location - R more than L  Severity - mild  Quality - puffiness around the eyes  Context - history of allergies  Modifying factors - washed with water  Associated signs and symptoms -no change in vision    2. Plantar callous  - chronic, months  - distal L foot  - multiple cryotherapy here  - recently piccking at it and filing it down  - less thick      Review of Systems  No wheezing, no cough, no vomiting, no rhinorrhea    Past Medical History  The following past medical history was reviewed with the patient.  He has a past medical history of Acne; Arthritis; Cerebral palsy (HCC/RAF); Eczema; Hypertension; Impaired glucose tolerance (03/23/2016); Low vitamin D level (05/07/2016); Seizure (HCC/RAF) (1973); and Shortening of arm, congenital.    Medications/Supplements  The following medication list was reviewed with the patient.    Current Outpatient Prescriptions:   ???  ASPIRIN ADULT LOW STRENGTH 81 MG EC tablet, TAKE 1 TABLET (81 MG TOTAL) BY MOUTH DAILY., Disp: 90 tablet, Rfl: 3  ???  benzoyl peroxide-erythromycin 5-3% gel, Apply to acne once daily.., Disp: 46.6 g, Rfl: 11  ???  IBU 800 MG tablet, TAKE 1 TABLET (800 MG TOTAL) BY MOUTH FOUR (4) TIMES DAILY., Disp: 150 tablet, Rfl: 0  ???  ibuprofen 800 mg tablet, Take 1 tablet (800 mg total) by mouth every twelve (12) hours as needed for Moderate Pain (Pain Scale 4-6)., Disp: 60 tablet, Rfl: 5  ???  phenytoin 100 mg ER capsule, Take 1 capsule (100 mg total) by mouth three (3) times daily., Disp: 270 capsule, Rfl: 3 ???  RETIN-A MICRO PUMP 0.04 % gel, Apply topically at bedtime., Disp: 50 g, Rfl: 11  ???  sulfacetamide-sulfur 9-4.5 % LIQD liquid, Apply to face twice daily., Disp: 454 g, Rfl: 11    Objective:   Physical Exam  BP 164/107  ~ Pulse (!) 101  ~ Temp 37.1 ???C (98.7 ???F) (Tympanic)  ~ Resp 16  ~ Ht 1.829 m (6')  ~ Wt 98.9 kg (218 lb)  ~ BMI 29.57 kg/m???    Wt Readings from Last 3 Encounters:   04/29/18 98.9 kg (218 lb)   04/01/18 99 kg (218 lb 3.2 oz)   03/04/18 98.4 kg (217 lb)   Constitutional - alert, no distress, overweight  Eyes - mildly injected conjunctiva with faint clear discharge in each eye without significnat periorbital edema  Ears, Nose, Mouth and Throat - lips normal, mucosa normal without mucoid discharge  Neck - supple, midline trachea  Skin - normal texture, no rash, mildly thick (calloused) plantar skin on the distal L foot without specific lesion  Neuro - no gross focal deficits, normal speech  Psychiatric - normal insight and judgement, normal mental status    Assessment/Plan:       ICD-9-CM ICD-10-CM    1. Allergic conjunctivitis of both eyes 372.14 H10.13    2.  White coat syndrome without hypertension 796.2 R03.0    3. Plantar wart of left foot 078.12 B07.0    4. Overweight 278.02 E66.3        Assessment and Plan per patient instructions:  You likely have an allergy that is causing the redness and the discharge from your eye.  I recommend ketotifen (Zaditor) eye drops as needed for the next few days.  You can also try loratadine 10mg  as needed for allergy symptoms including itchy eyes. Both of these are available without a prescription.        Problem   White Coat Syndrome Without Hypertension    HTN diagnosis in the past but likely was only white coat HTN as home readings consistently reassuring and home cuff has correlated with office measurement on 12/02/2017, lip swelling with carvedilol and amlodipine, rash with losartan, sulfa allergy with HCTZ. 04/29/2018 - noted elevation but still consistent with diagnosed of again elevated but home BP reassuring, will continue monitoring     Allergic Conjunctivitis    04/29/2018- very mild, will treat with ketotifen prn and also discussed loratadine prn.  F/u if no improvement.     Plantar Wart of Left Foot    Two on plantar aspect at base of 2nd and 3rd toes.  Multiple cryotherapy since Oct 2018  04/01/2018 - again very small but still symptomatic, cryothereapy again today, f/u prn  04/29/2018- resolved, okay to continue pumice stone and f/u prn       Overweight    04/02/2016 - neg screen for OSA  04/01/2018 - no significant change, counseled on diet/exercise at length, also discussed metformin which may help with weight loss and patient to consider for the future.  04/29/2018- counseled on cut out protein supplementation and replace it with fruits and vegetables.       The above plan of care, diagnosis, orders, and follow-up were discussed with the patient.  Questions related to this recommended plan of care were answered.    Follow up: Return if symptoms worsen or fail to improve.    Lendon Collar Vic Blackbird, MD, MPH  04/29/2018 at 10:09 AM  Time of note filed does not necessarily reflect the time of encounter.

## 2018-05-04 NOTE — Progress Notes
RHEUMATOLOGY OUTPATIENT CONSULTATION  PATIENT: Dustin Watts MRN: [id number] DOB: 02-21-1956  REASONS FOR VISIT/CHIEF COMPLAINT:OA      HPI:62 y.o. male former pt of Dr Roselee Nova with generalized OA, OK knees here to establish care.  Hx of epilepsy, pre-diabetic, HTN, HLD, vit D deficiency    Current visit patient reports the followings:-      11/2017   -last visit increasing knee pain XR R knee good L knee mild OA   - doing well; no seizure any longer  - joints are not bad; no pain with walking; no need to take ibuprofen regularly; takes infrequently- a day before and on day out e[place]g going to [place]; requesting refill; no problem no GERD/taking with food    08/2017  -last visit 02/2017 joints are doing fine on prn NSAID/post CVA  -knees are doing not too badly ''only after long walk'' ''described as blown up'', keen to have refill ibuprofen 800 mg PRN given by Dr Roselee Nova in past ''I only take it if I have a long walk to do so taking infrequently''; no stiffness; at times puffy not today; slight worse lately with pain   -other joints are fine  -chronic R sided atrophy since borne Dx cerebral palsy otherwise in good health  -no seizure for some time now      No known history of malar rash, photosensitivity rash, discoid lesions, oral/nasal ulcers,  pleuritic chest pain, seizures/psychosis, Raynaud's, skin tightness, dry eyes/mouth, hair loss or blood clots.  No history of muscle pain or proximal myopathy.  No history for fever, weight loss, loss of appetite, night sweat or lymphadenopathy.  No history of skin psoriasis, eye issues, STDs or IBD.    No recent travelling.  No sick contacts.    Prior medication/Joint injections/Immune suppressant therapy:  None    ROS:  A 14 point ROS was obtained and significant for what is mentioned in HPI and below.    ROS    (Check if Normal, or note + findings):   []    reviewed questionnaire on 05/03/2018    []   Not Obtainable :   [x]    Constit : [-] fever, [-] weight loss    [x]  Skin :[-] vasculitic palpable rash, [-] tender nodules, [-]scaling plaques      [x]    Eyes : [-] photophobia, [-] red eye [-] gritty eyes    [x]    CV :[-] LE swelling, orthopnea, [-] PND,    []    MS :     [x]    ENT: [-] ear /nose swelling, [-] throat pain or post nasal d/c   [x]    GI :[-] constipation [-] diarrhea  [-] melena [-]heartburn,   [x]    Heme/Lymph :[-] easy bruising or bleeding      [x]   Resp : [-] DOE, [-] wheezing [-] pleurisy   [x]    GU:[-]dysruria, [-] gross hematuria   []    Neuro :[-] focal neurological [-] wrist or foot drop     [x]    Imm/All :[-] recurrent infections in past, [-] pollen or seasonal allergy   [x]   Endo :   [x]    Psych: [-] mood, [-] memory     PMHx:  Past Medical History:   Diagnosis Date   ??? Acne    ??? Arthritis    ??? Cerebral palsy (HCC/RAF)    ??? Eczema    ??? Hypertension    ??? Impaired glucose tolerance 03/23/2016    HGBA1C of 5.7 on 03/19/2016    ???  Low vitamin D level 05/07/2016    05/07/2016 - taking antiepiletpic which may exacerbate it, started on ergo 50000, will recheck level in about 2 months    ??? Seizure (HCC/RAF) 1973    chronically on Dilantin, no seizure in 25 years, followed by Neurologist (Dr. Wiliam Ke) at [facility]   ??? Shortening of arm, congenital     right arm       PSHx:  Past Surgical History:   Procedure Laterality Date   ??? rectal abscess  2010    surgery done at [facility]       Meds:    No outpatient prescriptions have been marked as taking for the 05/06/18 encounter (Appointment) with Liana Gerold, MD, MS.       Allergies:  Allergies   Allergen Reactions   ??? Sulfa Antibiotics Angioedema     No anaphylaxis   ??? Amlodipine Other (See Comments)     Lip swelling without anaphylaxis   ??? Carvedilol Angioedema     Lip swelling without anaphylaxis   ??? Losartan Rash     No hive, no anaphylaxis, unclear if truly has rash from this medicine   ??? Penicillins Rash     ''bumps on body''       SocHx:  Social History   Substance Use Topics ??? Smoking status: Never Smoker   ??? Smokeless tobacco: Never Used   ??? Alcohol use No     Works in Loss adjuster, chartered; single    FamHx:  Family History   Problem Relation Age of Onset   ??? Heart disease Mother    ??? Diabetes Mother    ??? Kidney disease Mother    ??? Stroke Brother        Physical Exam:    BP 134/83  ~ Pulse 70  ~ Temp 36.4 ???C (97.5 ???F) (Oral)  ~ Resp 18  ~ Ht 6' (1.829 m)  ~ Wt 209 lb (94.8 kg)  ~ SpO2 97%  ~ BMI 28.35 kg/m???      BP 128/72  ~ Pulse 70  ~ Temp 36.4 ???C (97.5 ???F) (Oral)  ~ Resp 16  ~ Ht 6' (1.829 m)  ~ Wt 205 lb (93 kg)  ~ SpO2 97%  ~ BMI 27.80 kg/m???     Wt Readings from Last 3 Encounters:   04/29/18 98.9 kg (218 lb)   04/01/18 99 kg (218 lb 3.2 oz)   03/04/18 98.4 kg (217 lb)     BP Readings from Last 3 Encounters:   04/29/18 164/107   04/01/18 (!) 159/98   03/04/18 153/83       NAD  No ocular injection  No alopecia  OP clear  No LAD  CTAB  RRR  S/NT/ND  No C/C/E  No rashes  No telangectasia/discoloration/calcinosis/sclerodactyly  No tender joints  No swollen joints  L knee crepitus+ no effusion  No enthesitis  No dactylitis  No myofascial pain  Neuro grossly intact except R extremities atrophy  Peripheral pulses intact      RIGHT         LEFT   TMJ [] []   ~ [] []  TMJ   Shoulder [] []  ~ [] []  Shoulder                Left box: Tender   Elbow [] []  ~ [] []  Elbow               Right box: Swollen   Wrist [] []  ~ [] []  Wrist  CMC1 [] []  ~ [] []  CMC1       MCP5 MCP4 MCP3 MCP2 MCP1         ~ MCP1 MCP2 MCP3 MCP4 MCP5       [] []  [] []  [] []  [] []  [] []   [] []  [] []  [] []  [] []  [] []        PIP5 PIP4 PIP3 PIP2 IP1 ~ IP1 PIP2 PIP3 PIP4 PIP5       [] []  [] []  [] []  [] []  [] []   [] []  [] []  [] []  [] []  [] []        DIP5 DIP4 DIP3 DIP2  ~  DIP2 DIP3 DIP4 DIP5       [] []  [] []  [] []  [] []     [] []  [] []  [] []  [] []       Sacroiliac []  ~             []  Sacroiliac                Hip  [] []  ~        [] []  Hip                  Knee  [] []  ~        [] []  Knee        Ankle    [] []  ~        [] []  Ankle MTP5 MTP4 MTP3 MTP2 MTP1 ~ MTP1 MTP2 MTP3 MTP4 MCP5       [] []  [] []  [] []  [] []  [] []   [] []  [] []  [] []  [] []  [] []        Toe5 Toe4 Toe3 Toe2 Toe1 ~ Toe1 Toe2 Toe3 Toe4 Toe5        [] []  [] []  [] []  [] []  [] []   [] []  [] []  [] []  [] []  [] []                      Labs:  Lab Results   Component Value Date    WBC 5.08 05/27/2017    HGB 14.3 05/27/2017    HCT 42.8 05/27/2017    PLT 163 05/27/2017    NEUTABS 4.0 11/28/2014    LYMPHABS 1.7 11/28/2014     Lab Results   Component Value Date    GLUCOSE 104 (H) 05/27/2017    GFREST >89 04/18/2014    CREAT 0.62 05/27/2017    CALCIUM 9.1 05/27/2017    TOTPRO 7.6 05/27/2017    ALBUMIN 4.7 05/27/2017    AST 24 05/27/2017    ALT 24 05/27/2017    ALKPHOS 111 05/27/2017     @RRCOAGS @  Lab Results   Component Value Date    TSH 1.5 01/21/2017       Rheum labs:  Lab Results   Component Value Date    SRWEST 13 (H) 05/27/2017    SRWEST 15 (H) 11/28/2014     No results found for: CARDLIPIGA, CARDLIPIGG, CARDLIPIGM, ANAAB, ANAABTITER, B2GLYPROIGA, B2GLYPROIGG, B2GLYPROIGM, DRVVT, RNPAB, SMAB, SSA  No results found for: C3, C4, DSDNAAB, NDNAABIFA  No results found for: Lyman Bishop, MPOAB  Lab Results   Component Value Date    CKTOT 68 04/23/2008    URICACID 3.6 11/28/2014     Lab Results   Component Value Date    VITD25OH 45 01/10/2018    VITD25OH 45 08/04/2017     UA:   Lab Results   Component Value Date    PROTCLUR Trace 04/23/2008    BLDUR Negative 04/23/2008    LEUKESTUR Negative 04/23/2008    RBCSUR 5 04/23/2008    WBCSUR 3 04/23/2008       Studies: [x] reviewed [] ordered [] independently reviewed.    Imaging:  Knees XR 08/2017   Mild osteoarthritis of the left knee.  No significant arthritis of the right knee.    Knees 2007 normal     ASSESSMENT AND PLAN  DONOVIN KRAEMER is a 62 y.o. male     Knees OA stable on prn prn ibuprofen 800 mg as needed; no GERD; pt takes rarely; recent slight worseing  Hx of epilepsy stable   pre-diabetic, HTN, HLD  Hx vit D deficiency WNL 02/2017 overweight    RECOMMENDATIONS  - Refill ibuprofen 800 mg as PRN for knee pain/sied effects discussed  - continue to loose weight  - explain in detail and all questions answered:- Repeat Knees XR mild OA L knee/if worsening consider CSI      RTC  6 months or sooner as needed.  CC note to PCP/referred physician    Orders Placed This Encounter   ??? ibuprofen 800 mg tablet           IMMUNIZATION STATUS  Immunization History   Administered Date(s) Administered   ??? Tdap 03/08/2016   ??? influenza vaccine IM quadrivalent (Fluarix Quad) (PF) SYR (46 months of age and older) 08/04/2017   ??? influenza vaccine IM quadrivalent (Fluzone Quad) (PF) SYR/SDV (47 years of age and older) 08/06/2016   ??? influenza, unspecified formulation 08/06/2016, 08/04/2017     Thank you for allowing me to participate in this patient's care.  If I can be of further service, please contact my office at  [phone].  [x]  >50%  of this * minute visit was spent in face-to-face evaluation and counseling the patient.  The above plan of care, diagnosis, orders, and follow-up were discussed with the patient. Questions related to this recommended plan of care were answered  []  Immunosuppression drugs changed. [x]  Risks/benefits/toxicities of meds discussed.   []  Reviewed and summarized old records. []  Requested outside medical records.  []  Discussed with other providers of complex medical conditions and management plans outlined above    Liana Gerold MD Rheumatology Division, [place] [phone])

## 2018-05-06 ENCOUNTER — Ambulatory Visit: Payer: MEDICAID

## 2018-05-27 ENCOUNTER — Ambulatory Visit

## 2018-06-03 ENCOUNTER — Ambulatory Visit

## 2018-06-06 MED ORDER — VITAMIN D2 (ERGOCALCIFEROL) 50000 UNITS PO CAPS
50000 [IU] | ORAL_CAPSULE | ORAL | 1 refills | Status: AC
Start: 2018-06-06 — End: ?

## 2018-06-06 MED ORDER — IBU 800 MG PO TABS
ORAL_TABLET | 1 refills
Start: 2018-06-06 — End: ?

## 2018-06-15 ENCOUNTER — Telehealth

## 2018-06-15 NOTE — Telephone Encounter
Spoke w/ cathy info provided.   Dustin Watts [name]

## 2018-06-15 NOTE — Telephone Encounter
Call Back Request    MD:   Otelia LimesGoh    Reason for call back:  Beth from [employer] called regarding RETIN-A MICRO PUMP 0.04 % gel. [employer] pharmacy needs to know diagnosis and confirm if patient tried other medication. Per pharmacy, medication has a criteria that pt. must try at least two (2) formulary alternatives. Please call to provide information  CBN [phone]    Any Symptoms:  []  Yes  [x]  No      ? If yes, what symptoms are you experiencing:    o Duration of symptoms (how long):    o Have you taken medication for symptoms (OTC or Rx):      Patient or caller has been notified of the 24-48 hour turnaround time.

## 2018-06-15 NOTE — Telephone Encounter
Please assist   Jontez Redfield Walker, MA

## 2018-07-01 ENCOUNTER — Ambulatory Visit

## 2018-07-08 ENCOUNTER — Ambulatory Visit

## 2018-07-08 DIAGNOSIS — K429 Umbilical hernia without obstruction or gangrene: Secondary | ICD-10-CM

## 2018-07-08 DIAGNOSIS — I8393 Asymptomatic varicose veins of bilateral lower extremities: Secondary | ICD-10-CM

## 2018-07-08 NOTE — Progress Notes
[facility] INTERNAL MEDICINE & PEDIATRICS - Office Note  [facility]  [address]  [place] [address]  Phone: [phone]  FAX: [phone]  [phone]     Subjective:   CC:  Follow-up    HPI:  Dustin Watts is a 62 y.o. male who presents for the following issue(s).    1. Elevated BP  Timing (onset/duration) - chronic, noted >1 year ago  Context - home BP records since May show SBP in the 110-120's and DBP less than 80.  Modifying factors - has been on BP meds in the past but not currently  Associated signs and symptoms - no headache, no CP, no SOB, no vision change    2. Lesion  - chronic  - plantar surface R foot  - mild, but worsen compared to prior  - no longer picking at it  - has had cryotherapy in the past    3. Veins  - chronic, weeks  - More R and L lower leg  - thought to be varicose veins  - nontender  - bulging when active and on his feet for long periods of time      Review of Systems  As above in the HPI and also a 14 point review of systems was reviewed with the patient (see document filled out by the patient today scanned into [employer]).    Past Medical History  The following past medical history was reviewed with the patient.  He has a past medical history of Acne; Arthritis; Cerebral palsy (HCC/RAF); Eczema; Hypertension; Impaired glucose tolerance (03/23/2016); Low vitamin D level (05/07/2016); Seizure (HCC/RAF) (1973); and Shortening of arm, congenital.    Medications/Supplements  The following medication list was reviewed with the patient.    Current Outpatient Prescriptions:   ???  ASPIRIN ADULT LOW STRENGTH 81 MG EC tablet, TAKE 1 TABLET (81 MG TOTAL) BY MOUTH DAILY., Disp: 90 tablet, Rfl: 3  ???  benzoyl peroxide-erythromycin 5-3% gel, Apply to acne once daily.., Disp: 46.6 g, Rfl: 11  ???  ERGOCALCIFEROL 50000 units capsule, TAKE 1 CAPSULE (50,000 UNITS TOTAL) BY MOUTH EVERY SEVEN (7) DAYS FOR 24 DOSES., Disp: 12 capsule, Rfl: 1 ???  IBU 800 MG tablet, TAKE 1 TABLET (800 MG TOTAL) BY MOUTH FOUR (4) TIMES DAILY., Disp: 150 tablet, Rfl: 0  ???  ibuprofen 800 mg tablet, Take 1 tablet (800 mg total) by mouth every twelve (12) hours as needed for Moderate Pain (Pain Scale 4-6)., Disp: 60 tablet, Rfl: 5  ???  phenytoin 100 mg ER capsule, Take 1 capsule (100 mg total) by mouth three (3) times daily., Disp: 270 capsule, Rfl: 3  ???  RETIN-A MICRO PUMP 0.04 % gel, Apply topically at bedtime., Disp: 50 g, Rfl: 11  ???  sulfacetamide-sulfur 9-4.5 % LIQD liquid, Apply to face twice daily., Disp: 454 g, Rfl: 11    Objective:   Physical Exam  BP 161/103  ~ Pulse (!) 107  ~ Temp 36.6 ???C (97.9 ???F) (Oral)  ~ Ht 1.829 m (6')  ~ Wt 97.5 kg (215 lb)  ~ SpO2 94%  ~ BMI 29.16 kg/m???    Wt Readings from Last 3 Encounters:   07/08/18 97.5 kg (215 lb)   04/29/18 98.9 kg (218 lb)   04/01/18 99 kg (218 lb 3.2 oz)   Constitutional - alert, no distress, overweight  Eyes - clear conjunctiva, no discharge  Ears, Nose, Mouth and Throat - lips  normal, mucosa normal  Neck - supple, midline trachea  Gastrointestinal - soft, nontender including on the reducible 1.5cm umbilical hernia  Skin - normal texture, no rash, 1cm oval thickening of the callous on the distal R plantar surface without tenderness  Neuro - no gross focal deficits, normal speech  Psychiatric - normal insight and judgement, normal mental status    Labs  I personally reviewed the following labs  Lab Results   Component Value Date    HGBA1C 5.7 (H) 01/10/2018     Lab Results   Component Value Date    NA 143 05/27/2017    K 4.9 05/27/2017    CL 103 05/27/2017    CO2 24 05/27/2017    BUN 11 05/27/2017    CREAT 0.62 05/27/2017    GLUCOSE 104 (H) 05/27/2017    CALCIUM 9.1 05/27/2017    ALT 24 05/27/2017    AST 24 05/27/2017    ALKPHOS 111 05/27/2017    BILITOT 0.3 05/27/2017     Lab Results   Component Value Date    WBC 5.08 05/27/2017    HGB 14.3 05/27/2017    HCT 42.8 05/27/2017    MCV 101.2 (H) 05/27/2017 PLT 163 05/27/2017     Lab Results   Component Value Date    TSH 1.5 01/21/2017         Assessment/Plan:       ICD-9-CM ICD-10-CM    1. White coat syndrome without hypertension 796.2 R03.0    2. Plantar wart of left foot 078.12 B07.0    3. Asymptomatic varicose veins of both lower extremities 454.9 I83.93    4. Umbilical hernia without obstruction and without gangrene 553.1 K42.9          Problem   White Coat Syndrome Without Hypertension    HTN diagnosis in the past but likely was only white coat HTN as home readings consistently reassuring and home cuff has correlated with office measurement on 12/02/2017, lip swelling with carvedilol and amlodipine, rash with losartan, sulfa allergy with HCTZ.  07/08/2018- reviewed home records since May 2019 which show SBP in the 110-120's and DBP less than 80.  Today's elevated result still consistent with [facility].       Varicose Veins of Both Lower Extremities    07/08/2018- not seen on exam today but reported on history, after staying on feet all day, not painful, counseled on clinical significance and lack of correlation with DVTs, patient to consider f/u with vascular surgery in the future for cosmetic reason     Plantar Wart of Left Foot    Two on plantar aspect at base of 2nd and 3rd toes.  Multiple cryotherapy since Oct 2018  07/08/2018- mild, cryo again today       Umbilical Hernia    05/27/2017 - reducible, unlikely to be the main cause of the abd pain, unless becomes symptoms does not require further workup at this time, will monitor.  10/07/2017 - counseled on indications for additional imaging or surgical management, advised f/u if any worsening.  07/08/2018- stable, no treatment at this time as rarely symptoms with only mild discomfort       Procedure: Wart cryotherapy  Indication: verruca  Site: distal L plantar surface  Time out performed.  Patient gave verbal permission for the procedure. Patient advised of risk for infection, irritation, post-inflammatory pigmentary alteration, redness, scar, and the potential need for additional treatment.   Number of lesions: 1  Treatment was performed with cryotherapy  using a liquid nitrogen spray gun; 5 sprays were applied directly to the lesion(s).  Patient tolerated the procedure well.  Complications: none  Estimate blood loss: none  Follow up: if the wart recurs, wound care instructions given      The above plan of care, diagnosis, orders, and follow-up were discussed with the patient.  Questions related to this recommended plan of care were answered.    Follow up: 2-3 months    Lendon Collar. Maciel Kegg, MD, MPH  07/08/2018 at 10:49 AM  Time of note filed does not necessarily reflect the time of encounter.

## 2018-07-22 ENCOUNTER — Ambulatory Visit

## 2018-07-27 NOTE — Progress Notes
RHEUMATOLOGY OUTPATIENT CONSULTATION  PATIENT: Dustin Watts MRN: [id number] DOB: [birth year]  REASONS FOR VISIT/CHIEF COMPLAINT:OA      HPI:61 y.o. male former pt of Dr Roselee Nova with generalized OA, OK knees here to establish care.  Hx of epilepsy, pre-diabetic, HTN, HLD, vit D deficiency    Current visit patient reports the followings:-      11/2017   -last visit increasing knee pain XR R knee good L knee mild OA   - doing well; no seizure any longer  - joints are not bad; no pain with walking; no need to take ibuprofen regularly; takes infrequently- a day before and on day out e[place]g going [place]; requesting refill; no problem no GERD/taking with food    08/2017  -last visit 02/2017 joints are doing fine on prn NSAID/post CVA  -knees are doing not too badly ''only after long walk'' ''described as blown up'', keen to have refill ibuprofen 800 mg PRN given by Dr Roselee Nova in past ''I only take it if I have a long walk to do so taking infrequently''; no stiffness; at times puffy not today; slight worse lately with pain   -other joints are fine  -chronic R sided atrophy since borne Dx cerebral palsy otherwise in good health  -no seizure for some time now      No known history of malar rash, photosensitivity rash, discoid lesions, oral/nasal ulcers,  pleuritic chest pain, seizures/psychosis, Raynaud's, skin tightness, dry eyes/mouth, hair loss or blood clots.  No history of muscle pain or proximal myopathy.  No history for fever, weight loss, loss of appetite, night sweat or lymphadenopathy.  No history of skin psoriasis, eye issues, STDs or IBD.    No recent travelling.  No sick contacts.    Prior medication/Joint injections/Immune suppressant therapy:  None    ROS:  A 14 point ROS was obtained and significant for what is mentioned in HPI and below.    ROS    (Check if Normal, or note + findings):   []    reviewed questionnaire on 07/27/2018    []   Not Obtainable :   [x]    Constit : [-] fever, [-] weight loss    [x]  Skin :[-] vasculitic palpable rash, [-] tender nodules, [-]scaling plaques      [x]    Eyes : [-] photophobia, [-] red eye [-] gritty eyes    [x]    CV :[-] LE swelling, orthopnea, [-] PND,    []    MS :     [x]    ENT: [-] ear /nose swelling, [-] throat pain or post nasal d/c   [x]    GI :[-] constipation [-] diarrhea  [-] melena [-]heartburn,   [x]    Heme/Lymph :[-] easy bruising or bleeding      [x]   Resp : [-] DOE, [-] wheezing [-] pleurisy   [x]    GU:[-]dysruria, [-] gross hematuria   []    Neuro :[-] focal neurological [-] wrist or foot drop     [x]    Imm/All :[-] recurrent infections in past, [-] pollen or seasonal allergy   [x]   Endo :   [x]    Psych: [-] mood, [-] memory     PMHx:  Past Medical History:   Diagnosis Date   ??? Acne    ??? Arthritis    ??? Cerebral palsy (HCC/RAF)    ??? Eczema    ??? Hypertension    ??? Impaired glucose tolerance 03/23/2016    HGBA1C of 5.7 on 03/19/2016    ???  Low vitamin D level 05/07/2016    05/07/2016 - taking antiepiletpic which may exacerbate it, started on ergo 50000, will recheck level in about 2 months    ??? Seizure (HCC/RAF) 1973    chronically on Dilantin, no seizure in 25 years, followed by Neurologist (Dr. Wiliam Ke) at [facility]   ??? Shortening of arm, congenital     right arm       PSHx:  Past Surgical History:   Procedure Laterality Date   ??? rectal abscess  2010    surgery done at [facility]       Meds:    No outpatient prescriptions have been marked as taking for the 07/28/18 encounter (Appointment) with Liana Gerold, MD, MS.       Allergies:  Allergies   Allergen Reactions   ??? Sulfa Antibiotics Angioedema     No anaphylaxis   ??? Amlodipine Other (See Comments)     Lip swelling without anaphylaxis   ??? Carvedilol Angioedema     Lip swelling without anaphylaxis   ??? Losartan Rash     No hive, no anaphylaxis, unclear if truly has rash from this medicine   ??? Penicillins Rash     ''bumps on body''       SocHx:  Social History   Substance Use Topics ??? Smoking status: Never Smoker   ??? Smokeless tobacco: Never Used   ??? Alcohol use No     Works in Loss adjuster, chartered; single    FamHx:  Family History   Problem Relation Age of Onset   ??? Heart disease Mother    ??? Diabetes Mother    ??? Kidney disease Mother    ??? Stroke Brother        Physical Exam:    BP 134/83  ~ Pulse 70  ~ Temp 36.4 ???C (97.5 ???F) (Oral)  ~ Resp 18  ~ Ht 6' (1.829 m)  ~ Wt 209 lb (94.8 kg)  ~ SpO2 97%  ~ BMI 28.35 kg/m???      BP 128/72  ~ Pulse 70  ~ Temp 36.4 ???C (97.5 ???F) (Oral)  ~ Resp 16  ~ Ht 6' (1.829 m)  ~ Wt 205 lb (93 kg)  ~ SpO2 97%  ~ BMI 27.80 kg/m???     Wt Readings from Last 3 Encounters:   07/08/18 215 lb (97.5 kg)   04/29/18 218 lb (98.9 kg)   04/01/18 218 lb 3.2 oz (99 kg)     BP Readings from Last 3 Encounters:   07/08/18 161/103   04/29/18 164/107   04/01/18 (!) 159/98       NAD  No ocular injection  No alopecia  OP clear  No LAD  CTAB  RRR  S/NT/ND  No C/C/E  No rashes  No telangectasia/discoloration/calcinosis/sclerodactyly  No tender joints  No swollen joints  L knee crepitus+ no effusion  No enthesitis  No dactylitis  No myofascial pain  Neuro grossly intact except R extremities atrophy  Peripheral pulses intact      RIGHT         LEFT   TMJ [] []   ~ [] []  TMJ   Shoulder [] []  ~ [] []  Shoulder                Left box: Tender   Elbow [] []  ~ [] []  Elbow               Right box: Swollen   Wrist [] []  ~ [] []  Wrist  CMC1 [] []  ~ [] []  CMC1       MCP5 MCP4 MCP3 MCP2 MCP1         ~ MCP1 MCP2 MCP3 MCP4 MCP5       [] []  [] []  [] []  [] []  [] []   [] []  [] []  [] []  [] []  [] []        PIP5 PIP4 PIP3 PIP2 IP1 ~ IP1 PIP2 PIP3 PIP4 PIP5       [] []  [] []  [] []  [] []  [] []   [] []  [] []  [] []  [] []  [] []        DIP5 DIP4 DIP3 DIP2  ~  DIP2 DIP3 DIP4 DIP5       [] []  [] []  [] []  [] []     [] []  [] []  [] []  [] []       Sacroiliac []  ~             []  Sacroiliac                Hip  [] []  ~        [] []  Hip                  Knee  [] []  ~        [] []  Knee        Ankle    [] []  ~        [] []  Ankle MTP5 MTP4 MTP3 MTP2 MTP1 ~ MTP1 MTP2 MTP3 MTP4 MCP5       [] []  [] []  [] []  [] []  [] []   [] []  [] []  [] []  [] []  [] []        Toe5 Toe4 Toe3 Toe2 Toe1 ~ Toe1 Toe2 Toe3 Toe4 Toe5        [] []  [] []  [] []  [] []  [] []   [] []  [] []  [] []  [] []  [] []                      Labs:  Lab Results   Component Value Date    WBC 5.08 05/27/2017    HGB 14.3 05/27/2017    HCT 42.8 05/27/2017    PLT 163 05/27/2017    NEUTABS 4.0 11/28/2014    LYMPHABS 1.7 11/28/2014     Lab Results   Component Value Date    GLUCOSE 104 (H) 05/27/2017    GFREST >89 04/18/2014    CREAT 0.62 05/27/2017    CALCIUM 9.1 05/27/2017    TOTPRO 7.6 05/27/2017    ALBUMIN 4.7 05/27/2017    AST 24 05/27/2017    ALT 24 05/27/2017    ALKPHOS 111 05/27/2017     @RRCOAGS @  Lab Results   Component Value Date    TSH 1.5 01/21/2017       Rheum labs:  Lab Results   Component Value Date    SRWEST 13 (H) 05/27/2017    SRWEST 15 (H) 11/28/2014     No results found for: CARDLIPIGA, CARDLIPIGG, CARDLIPIGM, ANAAB, ANAABTITER, B2GLYPROIGA, B2GLYPROIGG, B2GLYPROIGM, DRVVT, RNPAB, SMAB, SSA  No results found for: C3, C4, DSDNAAB, NDNAABIFA  No results found for: Lyman Bishop, MPOAB  Lab Results   Component Value Date    CKTOT 68 04/23/2008    URICACID 3.6 11/28/2014     Lab Results   Component Value Date    VITD25OH 45 01/10/2018    VITD25OH 45 08/04/2017     UA:   Lab Results   Component Value Date    PROTCLUR Trace 04/23/2008    BLDUR Negative 04/23/2008    LEUKESTUR Negative 04/23/2008    RBCSUR 5 04/23/2008    WBCSUR 3 04/23/2008       Studies: [x] reviewed [] ordered [] independently reviewed.    Imaging:  Knees XR 08/2017   Mild osteoarthritis of the left knee.  No significant arthritis of the right knee.    Knees 2007 normal     ASSESSMENT AND PLAN  DONAVAN KERLIN is a 62 y.o. male     Knees OA stable on prn prn ibuprofen 800 mg as needed; no GERD; pt takes rarely; recent slight worseing  Hx of epilepsy stable   pre-diabetic, HTN, HLD  Hx vit D deficiency WNL 02/2017 overweight    RECOMMENDATIONS  - Refill ibuprofen 800 mg as PRN for knee pain/sied effects discussed  - continue to loose weight  - explain in detail and all questions answered:- Repeat Knees XR mild OA L knee/if worsening consider CSI      RTC  6 months or sooner as needed.  CC note to PCP/referred physician    Orders Placed This Encounter   ??? ibuprofen 800 mg tablet           IMMUNIZATION STATUS  Immunization History   Administered Date(s) Administered   ??? Tdap 03/08/2016   ??? influenza vaccine IM quadrivalent (Fluarix Quad) (PF) SYR (73 months of age and older) 08/04/2017   ??? influenza vaccine IM quadrivalent (Fluzone Quad) (PF) SYR/SDV (46 months of age and older) 08/06/2016   ??? influenza, unspecified formulation 08/06/2016, 08/04/2017     Thank you for allowing me to participate in this patient's care.  If I can be of further service, please contact my office at  [phone].  [x]  >50%  of this * minute visit was spent in face-to-face evaluation and counseling the patient.  The above plan of care, diagnosis, orders, and follow-up were discussed with the patient. Questions related to this recommended plan of care were answered  []  Immunosuppression drugs changed. [x]  Risks/benefits/toxicities of meds discussed.   []  Reviewed and summarized old records. []  Requested outside medical records.  []  Discussed with other providers of complex medical conditions and management plans outlined above    Liana Gerold MD Rheumatology Division, [place] [phone])

## 2018-07-28 ENCOUNTER — Ambulatory Visit

## 2018-07-29 ENCOUNTER — Ambulatory Visit

## 2018-08-05 ENCOUNTER — Ambulatory Visit

## 2018-08-05 DIAGNOSIS — H1013 Acute atopic conjunctivitis, bilateral: Secondary | ICD-10-CM

## 2018-08-05 NOTE — Progress Notes
[facility] INTERNAL MEDICINE & PEDIATRICS - Office Note  [facility]  [address]  [place] [address]  Phone: [phone]  FAX: [phone]  [phone]     Subjective:   CC:  Eye Problem    HPI:  Dustin Watts is a 62 y.o. male who presents for the following issue(s).    1. Eye problem  Timing (onset/uration) - in the last 7 days   Location - bilateral eyes  Severity - mild  Context - history of allergies.  Denies change in shampoo, laundry soap, body soap, body scents, food, recent travel, home or housing, or other changes in exposures.  Modifying factors - improved with ginger tea  Associated signs and symptoms - very puffy around the sides    2. Plantar wart  - chronic  - L foot  - multiple cryotherapy in the past  - mildly pruritic, only at night    3. Prediabetes  - chronic  - not taking medicine for this  - exercise (biking) more than half the days of the week        Review of Systems  As above in the HPI and also a 14 point review of systems was reviewed with the patient (see document filled out by the patient today scanned into [facility]).    Past Medical History  The following past medical history was reviewed with the patient.  He has a past medical history of Acne; Arthritis; Cerebral palsy (HCC/RAF); Eczema; Hypertension; Impaired glucose tolerance (03/23/2016); Low vitamin D level (05/07/2016); Seizure (HCC/RAF) (1973); and Shortening of arm, congenital.    Medications/Supplements  The following medication list was reviewed with the patient.    Current Outpatient Prescriptions:   ???  ASPIRIN ADULT LOW STRENGTH 81 MG EC tablet, TAKE 1 TABLET (81 MG TOTAL) BY MOUTH DAILY., Disp: 90 tablet, Rfl: 3  ???  benzoyl peroxide-erythromycin 5-3% gel, Apply to acne once daily.., Disp: 46.6 g, Rfl: 11  ???  ERGOCALCIFEROL 50000 units capsule, TAKE 1 CAPSULE (50,000 UNITS TOTAL) BY MOUTH EVERY SEVEN (7) DAYS FOR 24 DOSES., Disp: 12 capsule, Rfl: 1 ???  IBU 800 MG tablet, TAKE 1 TABLET (800 MG TOTAL) BY MOUTH FOUR (4) TIMES DAILY., Disp: 150 tablet, Rfl: 0  ???  ibuprofen 800 mg tablet, Take 1 tablet (800 mg total) by mouth every twelve (12) hours as needed for Moderate Pain (Pain Scale 4-6)., Disp: 60 tablet, Rfl: 5  ???  phenytoin 100 mg ER capsule, Take 1 capsule (100 mg total) by mouth three (3) times daily., Disp: 270 capsule, Rfl: 3  ???  RETIN-A MICRO PUMP 0.04 % gel, Apply topically at bedtime., Disp: 50 g, Rfl: 11  ???  sulfacetamide-sulfur 9-4.5 % LIQD liquid, Apply to face twice daily., Disp: 454 g, Rfl: 11    Objective:   Physical Exam  BP 147/82  ~ Pulse (!) 114  ~ Temp 36.2 ???C (97.2 ???F) (Tympanic)  ~ Ht 6' (1.829 m)  ~ Wt 213 lb (96.6 kg)  ~ SpO2 95%  ~ BMI 28.89 kg/m???    Wt Readings from Last 3 Encounters:   08/05/18 213 lb (96.6 kg)   07/08/18 215 lb (97.5 kg)   04/29/18 218 lb (98.9 kg)   Constitutional - alert, no distress  Eyes - mildly erythematous and injected conjunctiva b/l, clear liquid discharge, mild periorbital puffiness b/l  Ears, Nose, Mouth and Throat - lips normal, mucosa normal  Neck -  supple, midline trachea  Skin - normal texture, no rash including no abnormality noted on the plantar surface of the L foot  Neuro - no gross focal deficits, normal speech  Psychiatric - normal insight and judgement, normal mental status    Labs  I personally reviewed the following labs  Lab Results   Component Value Date    NA 143 05/27/2017    K 4.9 05/27/2017    CL 103 05/27/2017    CO2 24 05/27/2017    BUN 11 05/27/2017    CREAT 0.62 05/27/2017    GLUCOSE 104 (H) 05/27/2017    CALCIUM 9.1 05/27/2017    ALT 24 05/27/2017    AST 24 05/27/2017    ALKPHOS 111 05/27/2017    BILITOT 0.3 05/27/2017     Lab Results   Component Value Date    HGBA1C 5.7 (H) 01/10/2018     Lab Results   Component Value Date    CHOL 243 08/04/2017    CHOLHDL 63 08/04/2017    CHOLDLCAL 149 (H) 08/04/2017    TRIGLY 156 (H) 08/04/2017       Assessment/Plan:       ICD-9-CM ICD-10-CM 1. Allergic conjunctivitis of both eyes 372.14 H10.13    2. Prediabetes 790.22 R73.02 Hgb A1c   3. Plantar wart of left foot 078.12 B07.0    4. Overweight 278.02 E66.3    5. Elevated cholesterol with elevated triglycerides 272.2 E78.2 Lipid Panel   6. Low vitamin D level 790.6 R79.89 Vitamin D,25-Hydroxy       Assessment and Plan for allergic rhinitis per patient instructions:  I recommend cetirizine (Zyrtec) 10mg  daily for the next week for the allergies in the eyes.  You can also take ketotifen (Zaditor) eye drops and use these for for the next week.  Both are available without a prescription.      Problem   Plantar Wart of Left Foot    Two on plantar aspect at base of 2nd and 3rd toes.  Multiple cryotherapy since Oct 2018  07/08/2018- mild, cryo again today  08/05/2018 - normal appearing plantar surface on exam today, counseled patient that cryotherapy is not needed today     Elevated Cholesterol With Elevated Triglycerides    01/21/2017 - elevated lipids again 2 months ago (ASCVD score about 10%) but patient declines new   06/03/2017 - ASCVD score >7.5, would benefit from statin but patient declines, discussed benefits/risks again including the risk of heart disease and death, and patient prefers to continue with exercise and diet changes.    12/02/2017- ASCVD score still elevated, discussed risks again as shown above, patient declines again, will monitor every 6 months or so.  Counseled on diet/exercise again.  08/05/2018 - recheck today, counseled on diet/exercise     Low Vitamin D Level    taking antiepiletpic which may decrease vit D level  03/04/2018- adequate recent level, can change from weekly to daily supplementation  08/05/2018 - recheck today per patient concern     Prediabetes    Chronic, relatively stable  08/05/2018- recheck today, counseled on exercise (continue biking)     Overweight    04/02/2016 - neg screen for OSA  04/01/2018 - no significant change, counseled on diet/exercise at length, also discussed metformin which may help with weight loss and patient to consider for the future.  04/29/2018- counseled on cut out protein supplementation and replace it with fruits and vegetables.  08/05/2018 - improvement, congratulated and discussed sustainability  The above plan of care, diagnosis, orders, and follow-up were discussed with the patient.  Questions related to this recommended plan of care were answered.    Follow up: Return if symptoms worsen or fail to improve.    Lendon Collar Vic Blackbird, MD, MPH  08/05/2018 at 5:45 PM  Time of note filed does not necessarily reflect the time of encounter.  Portions of this note may have been created with voice recognition software. Occassional wrong-word or ''sound-alike'' substitutions may have occurred due to the inherent limitations of voice recognition software. Please read the chart carefully and recognize, using context, where these substitutions have occurred.

## 2018-08-05 NOTE — Patient Instructions
I recommend cetirizine (Zyrtec) 10mg  daily for the next week for the allergies in the eyes.  You can also take ketotifen (Zaditor) eye drops and use these for for the next week.  Both are available without a prescription.

## 2018-08-06 LAB — Hgb A1c: HGB A1C - HPLC: 5.8 — ABNORMAL HIGH (ref <5.7)

## 2018-08-06 LAB — Lipid Panel: CHOLESTEROL,LDL,CALCULATED: 166 mg/dL — ABNORMAL HIGH (ref <100)

## 2018-08-06 LAB — Vitamin D,25-Hydroxy: VITAMIN D,25-HYDROXY: 37 ng/mL (ref 20–50)

## 2018-08-19 ENCOUNTER — Ambulatory Visit: Payer: BLUE CROSS/BLUE SHIELD

## 2018-08-25 ENCOUNTER — Telehealth: Payer: BLUE CROSS/BLUE SHIELD

## 2018-08-25 NOTE — Telephone Encounter
Message to Practice/Provider    MD: Zettie Cooley    Message: Patient called stating he would like to inform Dr.Samras that he has been taking his medication Zyrtez and putting his eye drops like he prescribed.  He stated his eyes are a bit swollen but he will be in to see him until next week on 09/02/2018.      Thank you.    Return call is not being requested by the patient or caller.    Patient or caller has been notified of the 24-48 hour processing turnaround time if applicable.

## 2018-08-26 ENCOUNTER — Ambulatory Visit: Payer: BLUE CROSS/BLUE SHIELD

## 2018-08-28 NOTE — Progress Notes
RHEUMATOLOGY OUTPATIENT CONSULTATION  PATIENT: Dustin Watts MRN: [id number] DOB: Jun 11, 1956  REASONS FOR VISIT/CHIEF COMPLAINT:OA      HPI:61 y.o. male former pt of Dr Roselee Nova with generalized OA, OK knees here to establish care.  Hx of epilepsy, pre-diabetic, HTN, HLD, vit D deficiency    Current visit patient reports the followings:-      11/2017   -last visit increasing knee pain XR R knee good L knee mild OA   - doing well; no seizure any longer  - joints are not bad; no pain with walking; no need to take ibuprofen regularly; takes infrequently- a day before and on day out e[place]g going [place]; requesting refill; no problem no GERD/taking with food    08/2017  -last visit 02/2017 joints are doing fine on prn NSAID/post CVA  -knees are doing not too badly ''only after long walk'' ''described as blown up'', keen to have refill ibuprofen 800 mg PRN given by Dr Roselee Nova in past ''I only take it if I have a long walk to do so taking infrequently''; no stiffness; at times puffy not today; slight worse lately with pain   -other joints are fine  -chronic R sided atrophy since borne Dx cerebral palsy otherwise in good health  -no seizure for some time now      No known history of malar rash, photosensitivity rash, discoid lesions, oral/nasal ulcers,  pleuritic chest pain, seizures/psychosis, Raynaud's, skin tightness, dry eyes/mouth, hair loss or blood clots.  No history of muscle pain or proximal myopathy.  No history for fever, weight loss, loss of appetite, night sweat or lymphadenopathy.  No history of skin psoriasis, eye issues, STDs or IBD.    No recent travelling.  No sick contacts.    Prior medication/Joint injections/Immune suppressant therapy:  None    ROS:  A 14 point ROS was obtained and significant for what is mentioned in HPI and below.    ROS    (Check if Normal, or note + findings):   []    reviewed questionnaire on 08/28/2018    []   Not Obtainable :   [x]    Constit : [-] fever, [-] weight loss    [x]  Skin :[-] vasculitic palpable rash, [-] tender nodules, [-]scaling plaques      [x]    Eyes : [-] photophobia, [-] red eye [-] gritty eyes    [x]    CV :[-] LE swelling, orthopnea, [-] PND,    []    MS :     [x]    ENT: [-] ear /nose swelling, [-] throat pain or post nasal d/c   [x]    GI :[-] constipation [-] diarrhea  [-] melena [-]heartburn,   [x]    Heme/Lymph :[-] easy bruising or bleeding      [x]   Resp : [-] DOE, [-] wheezing [-] pleurisy   [x]    GU:[-]dysruria, [-] gross hematuria   []    Neuro :[-] focal neurological [-] wrist or foot drop     [x]    Imm/All :[-] recurrent infections in past, [-] pollen or seasonal allergy   [x]   Endo :   [x]    Psych: [-] mood, [-] memory     PMHx:  Past Medical History:   Diagnosis Date   ??? Acne    ??? Arthritis    ??? Cerebral palsy (HCC/RAF)    ??? Eczema    ??? Hypertension    ??? Impaired glucose tolerance 03/23/2016    HGBA1C of 5.7 on 03/19/2016    ???  Low vitamin D level 05/07/2016    05/07/2016 - taking antiepiletpic which may exacerbate it, started on ergo 50000, will recheck level in about 2 months    ??? Seizure (HCC/RAF) 1973    chronically on Dilantin, no seizure in 25 years, followed by Neurologist (Dr. Wiliam Ke) at [facility]   ??? Shortening of arm, congenital     right arm       PSHx:  Past Surgical History:   Procedure Laterality Date   ??? rectal abscess  2010    surgery done at [facility]       Meds:    No outpatient prescriptions have been marked as taking for the 09/02/18 encounter (Appointment) with Liana Gerold, MD, MS.       Allergies:  Allergies   Allergen Reactions   ??? Sulfa Antibiotics Angioedema     No anaphylaxis   ??? Amlodipine Other (See Comments)     Lip swelling without anaphylaxis   ??? Carvedilol Angioedema     Lip swelling without anaphylaxis   ??? Losartan Rash     No hive, no anaphylaxis, unclear if truly has rash from this medicine   ??? Penicillins Rash     ''bumps on body''       SocHx:  Social History   Substance Use Topics ??? Smoking status: Never Smoker   ??? Smokeless tobacco: Never Used   ??? Alcohol use No     Works in Loss adjuster, chartered; single    FamHx:  Family History   Problem Relation Age of Onset   ??? Heart disease Mother    ??? Diabetes Mother    ??? Kidney disease Mother    ??? Stroke Brother        Physical Exam:    BP 134/83  ~ Pulse 70  ~ Temp 36.4 ???C (97.5 ???F) (Oral)  ~ Resp 18  ~ Ht 6' (1.829 m)  ~ Wt 209 lb (94.8 kg)  ~ SpO2 97%  ~ BMI 28.35 kg/m???      BP 128/72  ~ Pulse 70  ~ Temp 36.4 ???C (97.5 ???F) (Oral)  ~ Resp 16  ~ Ht 6' (1.829 m)  ~ Wt 205 lb (93 kg)  ~ SpO2 97%  ~ BMI 27.80 kg/m???     Wt Readings from Last 3 Encounters:   08/05/18 213 lb (96.6 kg)   07/08/18 215 lb (97.5 kg)   04/29/18 218 lb (98.9 kg)     BP Readings from Last 3 Encounters:   08/05/18 147/82   07/08/18 161/103   04/29/18 164/107       NAD  No ocular injection  No alopecia  OP clear  No LAD  CTAB  RRR  S/NT/ND  No C/C/E  No rashes  No telangectasia/discoloration/calcinosis/sclerodactyly  No tender joints  No swollen joints  L knee crepitus+ no effusion  No enthesitis  No dactylitis  No myofascial pain  Neuro grossly intact except R extremities atrophy  Peripheral pulses intact      RIGHT         LEFT   TMJ [] []   ~ [] []  TMJ   Shoulder [] []  ~ [] []  Shoulder                Left box: Tender   Elbow [] []  ~ [] []  Elbow               Right box: Swollen   Wrist [] []  ~ [] []  Wrist   CMC1 [] []  ~ [] []   CMC1       MCP5 MCP4 MCP3 MCP2 MCP1         ~ MCP1 MCP2 MCP3 MCP4 MCP5       [] []  [] []  [] []  [] []  [] []   [] []  [] []  [] []  [] []  [] []        PIP5 PIP4 PIP3 PIP2 IP1 ~ IP1 PIP2 PIP3 PIP4 PIP5       [] []  [] []  [] []  [] []  [] []   [] []  [] []  [] []  [] []  [] []        DIP5 DIP4 DIP3 DIP2  ~  DIP2 DIP3 DIP4 DIP5       [] []  [] []  [] []  [] []     [] []  [] []  [] []  [] []       Sacroiliac []  ~             []  Sacroiliac                Hip  [] []  ~        [] []  Hip                  Knee  [] []  ~        [] []  Knee        Ankle    [] []  ~        [] []  Ankle MTP5 MTP4 MTP3 MTP2 MTP1 ~ MTP1 MTP2 MTP3 MTP4 MCP5       [] []  [] []  [] []  [] []  [] []   [] []  [] []  [] []  [] []  [] []        Toe5 Toe4 Toe3 Toe2 Toe1 ~ Toe1 Toe2 Toe3 Toe4 Toe5        [] []  [] []  [] []  [] []  [] []   [] []  [] []  [] []  [] []  [] []                      Labs:  Lab Results   Component Value Date    WBC 5.08 05/27/2017    HGB 14.3 05/27/2017    HCT 42.8 05/27/2017    PLT 163 05/27/2017    NEUTABS 4.0 11/28/2014    LYMPHABS 1.7 11/28/2014     Lab Results   Component Value Date    GLUCOSE 104 (H) 05/27/2017    GFREST >89 04/18/2014    CREAT 0.62 05/27/2017    CALCIUM 9.1 05/27/2017    TOTPRO 7.6 05/27/2017    ALBUMIN 4.7 05/27/2017    AST 24 05/27/2017    ALT 24 05/27/2017    ALKPHOS 111 05/27/2017     @RRCOAGS @  Lab Results   Component Value Date    TSH 1.5 01/21/2017       Rheum labs:  Lab Results   Component Value Date    SRWEST 13 (H) 05/27/2017    SRWEST 15 (H) 11/28/2014     No results found for: CARDLIPIGA, CARDLIPIGG, CARDLIPIGM, ANAAB, ANAABTITER, B2GLYPROIGA, B2GLYPROIGG, B2GLYPROIGM, DRVVT, RNPAB, SMAB, SSA  No results found for: C3, C4, DSDNAAB, NDNAABIFA  No results found for: Lyman Bishop, MPOAB  Lab Results   Component Value Date    CKTOT 68 04/23/2008    URICACID 3.6 11/28/2014     Lab Results   Component Value Date    VITD25OH 37 08/05/2018    VITD25OH 45 01/10/2018     UA:   Lab Results   Component Value Date    PROTCLUR Trace 04/23/2008    BLDUR Negative 04/23/2008    LEUKESTUR Negative 04/23/2008    RBCSUR 5 04/23/2008    WBCSUR 3 04/23/2008       Studies: [x] reviewed [] ordered [] independently reviewed.    Imaging:    Knees  XR 08/2017   Mild osteoarthritis of the left knee.  No significant arthritis of the right knee.    Knees 2007 normal     ASSESSMENT AND PLAN  WILGUS DEYTON is a 62 y.o. male     Knees OA stable on prn prn ibuprofen 800 mg as needed; no GERD; pt takes rarely; recent slight worseing  Hx of epilepsy stable   pre-diabetic, HTN, HLD  Hx vit D deficiency WNL 02/2017 overweight    RECOMMENDATIONS  - Refill ibuprofen 800 mg as PRN for knee pain/sied effects discussed  - continue to loose weight  - explain in detail and all questions answered:- Repeat Knees XR mild OA L knee/if worsening consider CSI      RTC  6 months or sooner as needed.  CC note to PCP/referred physician    Orders Placed This Encounter   ??? ibuprofen 800 mg tablet           IMMUNIZATION STATUS  Immunization History   Administered Date(s) Administered   ??? Tdap 03/08/2016   ??? influenza vaccine IM quadrivalent (Fluarix Quad) (PF) SYR (29 months of age and older) 08/04/2017   ??? influenza vaccine IM quadrivalent (Fluzone Quad) (PF) SYR/SDV (64 months of age and older) 08/06/2016   ??? influenza, unspecified formulation 08/06/2016, 08/04/2017     Thank you for allowing me to participate in this patient's care.  If I can be of further service, please contact my office at  [phone].  [x]  >50%  of this * minute visit was spent in face-to-face evaluation and counseling the patient.  The above plan of care, diagnosis, orders, and follow-up were discussed with the patient. Questions related to this recommended plan of care were answered  []  Immunosuppression drugs changed. [x]  Risks/benefits/toxicities of meds discussed.   []  Reviewed and summarized old records. []  Requested outside medical records.  []  Discussed with other providers of complex medical conditions and management plans outlined above    Liana Gerold MD Rheumatology Division, [place] [phone])

## 2018-09-02 ENCOUNTER — Ambulatory Visit: Payer: MEDICAID

## 2018-09-05 ENCOUNTER — Telehealth: Payer: BLUE CROSS/BLUE SHIELD

## 2018-09-05 MED ORDER — RETIN-A MICRO PUMP 0.04 % EX GEL
Freq: Every evening | TOPICAL | 11 refills
Start: 2018-09-05 — End: ?

## 2018-09-05 MED ORDER — RETIN-A MICRO PUMP 0.04 % EX GEL
Freq: Every evening | TOPICAL | 11 refills | Status: AC
Start: 2018-09-05 — End: 2019-01-13

## 2018-09-05 NOTE — Telephone Encounter
Hello Dr. Otelia Limes received call from patient stating he needs a refill for Retin-A micro pump 0.04 % gel, he would also like to know if he can have enough refills until the next appointment with you on 12-15-2018,    Please advise, Thank you   Dennard Nip LVN

## 2018-09-06 NOTE — Telephone Encounter
This was approved under rx request.

## 2018-09-08 ENCOUNTER — Ambulatory Visit: Payer: BLUE CROSS/BLUE SHIELD

## 2018-09-08 DIAGNOSIS — Z125 Encounter for screening for malignant neoplasm of prostate: Secondary | ICD-10-CM

## 2018-09-08 DIAGNOSIS — Z1211 Encounter for screening for malignant neoplasm of colon: Secondary | ICD-10-CM

## 2018-09-08 DIAGNOSIS — Z23 Encounter for immunization: Secondary | ICD-10-CM

## 2018-09-08 DIAGNOSIS — H1013 Acute atopic conjunctivitis, bilateral: Secondary | ICD-10-CM

## 2018-09-08 DIAGNOSIS — Z0000 Encounter for general adult medical examination without abnormal findings: Secondary | ICD-10-CM

## 2018-09-08 MED ORDER — ZOSTER VAC RECOMB ADJUVANTED 50 MCG/0.5ML IM SUSR
.5 mL | Freq: Once | INTRAMUSCULAR | 1 refills | Status: AC
Start: 2018-09-08 — End: ?

## 2018-09-08 MED ORDER — ATORVASTATIN CALCIUM 40 MG PO TABS
40 mg | ORAL_TABLET | Freq: Every day | ORAL | 3 refills | Status: AC
Start: 2018-09-08 — End: 2019-07-01

## 2018-09-08 NOTE — Patient Instructions
It looks like you have sufficient benefit with the ketotifen eye drops, please continue as needed for itchy eyes.  You can also occasionally take loratadine 10mg  (only 1 tab) as needed to help with the allergies if they are worsening.     I recommend starting the atorvastatin for your cholesterol.  Please let me know if you have any side effects.

## 2018-09-08 NOTE — Progress Notes
[facility]  [facility]  [address]  [place] [address]  Phone: [phone]  FAX: [phone]  [phone]    Preventive Health Visit - Medicare Visit     Subjective:     CC:    Follow-up    [ ]  G0402 - one time examination performed within 12 months of the first effective date of Medicare Part B coverage (does not cover any clinical laboratory tests)  [ ]  B2841 - ECG, routine, screening for initial preventive physical examination with interpretation and report  [ ]  G0404 - ECG, routine, screening for initial preventive physical examination without interpretation and report  [x ] [phone] - follow up 12 months after the last Annual Wellness Exam    Patient Active Problem List   Diagnosis   ??? Acne   ??? Osteoarthritis of both knees   ??? Overweight   ??? White coat syndrome without hypertension   ??? Epilepsy (HCC/RAF)   ??? Prediabetes   ??? Low vitamin D level   ??? Elevated cholesterol with elevated triglycerides   ??? Drug allergy   ??? Umbilical hernia   ??? Gall bladder stones   ??? Abnormal brain scan   ??? Allergic conjunctivitis   ??? Varicose veins of both lower extremities       HPI:   Dustin Watts is a 62 y.o. male who presents for a preventive health exam.    Health Maintenance   Topic Date Due   ??? Shingles (Shingrix) Vaccine (1 of 2) 09/03/2008   ??? Annual Preventive Wellness Visit  04/29/2018   ??? Colon Ca Screening: FIT/FOBT  05/27/2018   ??? Influenza Vaccine (1) 07/10/2018   ??? Statin prescribed for ASCVD Prevention or Treatment  09/08/2018   ??? Tdap/Td Vaccine (2 - Td) 03/08/2026   ??? Hepatitis C Screening  Completed   ??? HIV Screening  Completed       Activities of Daily Living:   [x]  Independent in all ADLs and instrumental ADLs   Needs help in the following:   []  Bathing:pers hygiene/grooming  []  Dressing  []  Walking: mobility    []  Toileting; continence  []  Eating  []  Shopping []  Housekeeping  []  Managing Meds  []  Handling Finance    []  Cooking  []  Using Telephone  []  Other     Hearing impairment  [x]  No issues   []  Discussed the following:     Fall risk  [x]  No issues   []  Falls in the past year?   []  Injury from fall in the past year?  []  Feels unsteady when standing or walking?  []  Worried about falling?     Home Safety   [x]  No issues    []  Worried about clutter in your home?   []  Poor lighting by day or night in your home?   []  Slippery surfaces in your home?   []  Worried about falling when using your toilet, bath or shower facilities?    Goals of Care and End-of-Life Planning  []  On file   [x]  Discussed as follows: full code    Depression screen  PHQ2 [ ]  positive; [x]  negative      PHQ-9 total score:   PHQ-9 Total Score: 0 (09/09/18 1800)    GAD-7 total score:   Total score: 0 (09/09/18 1800)    Healthy habits  [x ] Discussed sun screen use  [x ] Discussed diet and physical  activities  [x ] Discussed seat belt use  [x ] Discussed dentist visit twice a year    Physical exam screening  [ x] DRE and prostate exam discussed including the risks/benefits of this testing - deferred today  [ ]  Breast exam completed  [ ]  Pap +/- HPV testing discussed and performed    Screening labs  [x ] PSA discussed and ordered including the risks/benefits of this testing - screen today  [ ]  Chlamydia testing discussed  [x ] HIV testing discussed  [ ]  Hep C testing discussed  [x ] Lipids - recently done  [x ] FIT discussed and ordered  [x ] Diabetes screening with A1C discussed - recently done    Screening imaging/procedures  [ ]  Dexa scan discussed and ordered  [ ]  Mammography discussed and ordered  [x ] Colonoscopy discussed - will do FIT  [ ]  AAA screening  [ ]  Lung cancer screening discussed    Vaccines  If applicable, the vaccinations listed in the A/P section below were discussed and administered.      In addition to preventive health, the patient has the separately identifiable issues as shown below.    1. Overweight  Timing (onset/duration) - chronic  Severity - see BMI below, has been stable  Context - counseled multiple times in the past on diet/exercise  Modifying factors - frequent bike riding  Associated signs and symptoms - no diarrhea, no constipation, no abd pain    2. Itchy eyes  Timing (onset/duration) - last few months  Location - bilateral  Severity - mild, much improved with treatment below  Modifying factors - ketotifen helped.  The loratadine ''dried'' him out since he took 2 pills, and stopped taking it  Associated signs and symptoms - no rhinorrhea, no cough, no wheeze    3. Elevated BP follow-up  - chronic issue, has been diagnosed with both [facility] and also HTN beffore  - patient is currently taking no BP meds  - home BP results in the range of 110-120 over 60-80's  - ROS neg including no HA, CP, SOB, numbness, weakness, vision changes  - Last EKG 2017  Lab Results   Component Value Date    ALBCREATUR 8.0 01/21/2017     Lab Results   Component Value Date    CREAT 0.62 05/27/2017       4. HLD  - chronic  - mild recent lipids  - patient concerned of ''jitters'' with statin medicine that his relatives had  - has never taken cholesterol meds    2018 ACC/AHA guidelines recommends moderate or high-intensity statin because 10--year ASCVD risk>=7.5%. Patient is receiving guideline-directed statin therapy; monitor adherence.  10-year ASCVD risk  is 13.3%. Continue moderate or high-intensity statin therapy. Monitor adherence. as of 10:29 AM on 09/08/2018  10-year ASCVD risk with optimal risk factors is 5.0%.  Values used to calculate ASCVD score:  Age: 62 y.o.   Gender: Male Race: Not African American.  HDL cholesterol: 54 mg/dL. HDL cholesterol measured 34 days ago.  Total cholesterol: 257 mg/dL. Total cholesterol measured 34 days ago.  LDL cholesterol: 166 mg/dL. LDL cholesterol measured 34 days ago.  Systolic BP: 150 mm Hg. BP was measured today. The patient is not being treated with a medication that influences SBP.  The patient is currently not a smoker.  The patient does not have a diagnosis of diabetes.       Review of Systems  On review of systems, all of the  following were normal except for the bolded items which were abnormal:  Const - fevers, unusual fatigue  Eyes - vision change, eye irritation  ENT/mouth - rhinorrhea, sore throat  CV - CP, lower extremity edema  Resp - cough, SOB  GI - nausea, vomiting, abd pain, diarrhea, constipation  GU - lesions, discharge  Musc - body aches, joint aches  Skin - lesions, rash  Neuro - weakness, numbness  Psych - depression, anxiety  Endo - weight change, hair loss  Hem/lymph - bleeding, LAD  Allerg/immun- allergies, unusually frequent infections    Past Medical/Surgical/Family/Social History  The following past medical history was reviewed with the patient.  He has a past medical history of Acne; Arthritis; Cerebral palsy (HCC/RAF); Eczema; Hypertension; Impaired glucose tolerance (03/23/2016); Low vitamin D level (05/07/2016); Seizure (HCC/RAF) (1973); and Shortening of arm, congenital.    The following past surgical history was reviewed with the patient.  Past Surgical History:   Procedure Laterality Date   ??? rectal abscess  2010    surgery done at [facility] of the [place]       The following family history was reviewed with the patient.  Family History   Problem Relation Age of Onset   ??? Heart disease Mother    ??? Diabetes Mother    ??? Kidney disease Mother    ??? Stroke Brother    ??? Colon cancer Neg Hx    ??? Prostate cancer Neg Hx    ??? Ovarian cancer Neg Hx    ??? Breast cancer Neg Hx    ??? Hyperlipidemia Neg Hx    ??? Hypertension Neg Hx        The following social history was reviewed with the patient.  Social History     Social History   ??? Marital status: Married     Spouse name: N/A   ??? Number of children: 0   ??? Years of education: N/A     Occupational History   ??? 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 Smoking status: Never Smoker   ??? Smokeless tobacco: Never Used   ??? Alcohol use No   ??? Drug use: No   ??? Sexual activity: Not Currently     Partners: Female      Comment: never married (incorrectly documented in Epic)     Other Topics Concern   ??? Do You Exercise At Least A Day, 3 Or More Days A Week? No   ??? Types Of Exercise? (List In Comments) Yes     biking infrequently   ??? Do You Follow A Special Diet? No   ??? Vegan? No   ??? Vegetarian? No   ??? Gluten Free? No   ??? Omnivore? Yes     Social History Narrative   ??? None       Medications/Supplements  The following medication list was reviewed with the patient.    Current Outpatient Prescriptions:   ???  ASPIRIN ADULT LOW STRENGTH 81 MG EC tablet, TAKE 1 TABLET (81 MG TOTAL) BY MOUTH DAILY., Disp: 90 tablet, Rfl: 3  ???  benzoyl peroxide-erythromycin 5-3% gel, Apply to acne once daily.., Disp: 46.6 g, Rfl: 11  ???  ERGOCALCIFEROL 50000 units capsule, TAKE 1 CAPSULE (50,000 UNITS TOTAL) BY MOUTH EVERY SEVEN (7) DAYS FOR 24 DOSES., Disp: 12 capsule, Rfl: 1  ???  ibuprofen 800 mg tablet, Take 1 tablet (800 mg total) by mouth every twelve (12)  hours as needed for Moderate Pain (Pain Scale 4-6)., Disp: 60 tablet, Rfl: 5  ???  phenytoin 100 mg ER capsule, Take 1 capsule (100 mg total) by mouth three (3) times daily., Disp: 270 capsule, Rfl: 3  ???  RETIN-A MICRO PUMP 0.04 % gel, Apply topically at bedtime., Disp: 50 g, Rfl: 11  ???  sulfacetamide-sulfur 9-4.5 % LIQD liquid, Apply to face twice daily., Disp: 454 g, Rfl: 11  ???  atorvastatin 40 mg tablet, Take 1 tablet (40 mg total) by mouth daily., Disp: 90 tablet, Rfl: 3  ???  zoster vac recomb adjuvanted (SHINGRIX) 50 mcg injection, Inject 0.5 mLs into the muscle once for 1 dose., Disp: 0.5 mL, Rfl: 1    Objective:   Physical exam   BP 150/94  ~ Pulse 99  ~ Temp 36.2 ???C (97.1 ???F) (Tympanic)  ~ Ht 6' (1.829 m)  ~ Wt 212 lb (96.2 kg)  ~ SpO2 98%  ~ BMI 28.75 kg/m??? Measurement with home cuff today on the L arm was 155/111 and 161/114  Wt Readings from Last 3 Encounters:   09/08/18 212 lb (96.2 kg)   08/05/18 213 lb (96.6 kg)   07/08/18 215 lb (97.5 kg)   Constitutional - alert, no distress, overweight  Eyes - clear conjunctiva without signficant discharge, pupils reactive  Ears, Nose, Mouth and Throat - lips/mucosa/tongue normal, posterior oropharynx normal without exudate  Neck - supple with midline trachea, no thyromegaly, no lymphadenopathy  Respiratory - respirations unlabored, clear to auscultation bilaterally  Cardiovascular - regular rhythm, S1 and S2 normal, no murmur, no lower extremity edema  Skin - texture and turgor normal, no rash or lesions  MSK - decreased muscle mass of the R arm and leg  Neuro - normal strength throughout, normal speech, normal balance  Psychiatric - normal insight and judgement, normal mental status with adequate orientation      A trained chaperone was present for this exam/procedure: No  Reason why a trained chaperone was not present: Patient declined    Labs  I personally reviewed the following labs  Lab Results   Component Value Date    WBC 5.08 05/27/2017    HGB 14.3 05/27/2017    HCT 42.8 05/27/2017    MCV 101.2 (H) 05/27/2017    PLT 163 05/27/2017     Lab Results   Component Value Date    NA 143 05/27/2017    K 4.9 05/27/2017    CL 103 05/27/2017    CO2 24 05/27/2017    BUN 11 05/27/2017    CREAT 0.62 05/27/2017    GLUCOSE 104 (H) 05/27/2017    CALCIUM 9.1 05/27/2017    ALT 24 05/27/2017    AST 24 05/27/2017    ALKPHOS 111 05/27/2017    BILITOT 0.3 05/27/2017     Lab Results   Component Value Date    HGBA1C 5.8 (H) 08/05/2018     Lab Results   Component Value Date    CHOL 257 08/05/2018    CHOLDLCAL 166 (H) 08/05/2018    TRIGLY 187 (H) 08/05/2018     Lab Results   Component Value Date    VITD25OH 37 08/05/2018       Assessment/Plan:       ICD-10-CM    1. Routine general medical examination at a health care facility Z00.00 Influenza vaccine IM;   PF   2. Overweight E66.3    3. Need for shingles vaccine Z23 zoster vac recomb adjuvanted [facility])  50 mcg injection   4. White coat syndrome without hypertension R03.0    5. Screen for colon cancer Z12.11 Fecal Occult Blood Immunoassay   6. Elevated cholesterol with elevated triglycerides E78.2 atorvastatin 40 mg tablet   7. Allergic conjunctivitis of both eyes H10.13    8. Plantar wart of left foot B07.0    9. Screening for prostate cancer Z12.5 PSA,Screening         1. The preventive health counseling was completed as shown in the HPI and with the orders shown above.  Next preventive health exam will be in 1 year.    The following separately identifiable issues were evaluated during this visit:    Problem   White Coat Syndrome Without Hypertension    HTN diagnosis in the past but likely was only white coat HTN as home readings consistently reassuring and home cuff has correlated with office measurement on 12/02/2017, lip swelling with carvedilol and amlodipine, rash with losartan, sulfa allergy with HCTZ.  09/08/2018 - again home records reassuring with elevation in the office.  Good correlation between measurements on home BP cuff today and our automated cuff.  Therefore very unlikely to have HTN, but rather [facility]     Allergic Conjunctivitis    04/29/2018- very mild, will treat with ketotifen prn and also discussed loratadine prn.  F/u if no improvement.  09/08/2018 - benefit with ketotifen, will continue. Can try loratadine prn     Elevated Cholesterol With Elevated Triglycerides    01/21/2017 - elevated lipids again 2 months ago (ASCVD score about 10%) but patient declines new   06/03/2017 - ASCVD score >7.5, would benefit from statin but patient declines, discussed benefits/risks again including the risk of heart disease and death, and patient prefers to continue with exercise and diet changes.    12/02/2017- ASCVD score still elevated, discussed risks again as shown above, patient declines again, will monitor every 6 months or so.  Counseled on diet/exercise again.  08/05/2018 - ASCVD 13%, again counseled on statin.  Will prescribe Lipitor 40 and pt to consider     Overweight    04/02/2016 - neg screen for OSA  09/08/2018 - again mild improvement, discussed diet modifications     Plantar Wart of Left Foot (Resolved)    Two on plantar aspect at base of 2nd and 3rd toes.  Multiple cryotherapy since Oct 2018  07/08/2018- mild, cryo again today  08/05/2018 - normal appearing plantar surface on exam today, counseled patient that cryotherapy is not needed today  09/08/2018 - resolved         The above plan of care, diagnosis, orders, and follow-up were discussed with the patient.  Questions related to this recommended plan of care were answered.    Follow up: prn    Lendon Collar. Vic Blackbird, MD, MPH  09/08/2018 at 10:29 AM  Time of note filed does not reflect the time of encounter.  Portions of this note may have been created with voice recognition software. Occassional wrong-word or ''sound-alike'' substitutions may have occurred due to the inherent limitations of voice recognition software. Please read the chart carefully and recognize, using context, where these substitutions have occurred.

## 2018-09-09 ENCOUNTER — Ambulatory Visit: Payer: MEDICAID

## 2018-09-09 LAB — PSA,Screening: PSA,SCREENING: 1.1 ng/mL (ref 0–4.5)

## 2018-09-15 NOTE — Progress Notes
RHEUMATOLOGY OUTPATIENT CONSULTATION  PATIENT: Dustin Watts MRN: [id number] DOB: 22-Feb-1956  REASONS FOR VISIT/CHIEF COMPLAINT:OA      HPI:62 y.o. male former pt of Dr Roselee Nova with generalized OA, OK knees here to establish care.  Hx of epilepsy, pre-diabetic, HTN, HLD, vit D deficiency    Current visit patient reports the followings:-  - overall good no falls; L knee some swelling and worsening pain wondering if take fluid out or if any fluid inside  - ibuprofen as needed 800 mg 2 times a day only prn for years no problem with food no indigestion  - otherwise doing well      11/2017   -last visit increasing knee pain XR R knee good L knee mild OA   - doing well; no seizure any longer  - joints are not bad; no pain with walking; no need to take ibuprofen regularly; takes infrequently- a day before and on day out [place] going [place]; requesting refill; no problem no GERD/taking with food    08/2017  -last visit 02/2017 joints are doing fine on prn NSAID/post CVA  -knees are doing not too badly ''only after long walk'' ''described as blown up'', keen to have refill ibuprofen 800 mg PRN given by Dr Roselee Nova in past ''I only take it if I have a long walk to do so taking infrequently''; no stiffness; at times puffy not today; slight worse lately with pain   -other joints are fine  -chronic R sided atrophy since borne Dx cerebral palsy otherwise in good health  -no seizure for some time now      No known history of malar rash, photosensitivity rash, discoid lesions, oral/nasal ulcers,  pleuritic chest pain, seizures/psychosis, Raynaud's, skin tightness, dry eyes/mouth, hair loss or blood clots.  No history of muscle pain or proximal myopathy.  No history for fever, weight loss, loss of appetite, night sweat or lymphadenopathy.  No history of skin psoriasis, eye issues, STDs or IBD.    No recent travelling.  No sick contacts.    Prior medication/Joint injections/Immune suppressant therapy:  None    ROS: A 14 point ROS was obtained and significant for what is mentioned in HPI and below.    ROS    (Check if Normal, or note + findings):   []    reviewed questionnaire on 09/15/2018    []   Not Obtainable :   [x]    Constit : [-] fever, [-] weight loss    [x]    Skin :[-] vasculitic palpable rash, [-] tender nodules, [-]scaling plaques      [x]    Eyes : [-] photophobia, [-] red eye [-] gritty eyes    [x]    CV :[-] LE swelling, orthopnea, [-] PND,    []    MS :     [x]    ENT: [-] ear /nose swelling, [-] throat pain or post nasal d/c   [x]    GI :[-] constipation [-] diarrhea  [-] melena [-]heartburn,   [x]    Heme/Lymph :[-] easy bruising or bleeding      [x]   Resp : [-] DOE, [-] wheezing [-] pleurisy   [x]    GU:[-]dysruria, [-] gross hematuria   []    Neuro :[-] focal neurological [-] wrist or foot drop     [x]    Imm/All :[-] recurrent infections in past, [-] pollen or seasonal allergy   [x]   Endo :   [x]    Psych: [-] mood, [-] memory     PMHx:  Past Medical History:   Diagnosis Date   ??? Acne    ??? Arthritis    ??? Cerebral palsy (HCC/RAF)    ??? Eczema    ??? Hypertension    ??? Impaired glucose tolerance 03/23/2016    HGBA1C of 5.7 on 03/19/2016    ??? Low vitamin D level 05/07/2016    05/07/2016 - taking antiepiletpic which may exacerbate it, started on ergo 50000, will recheck level in about 2 months    ??? Seizure (HCC/RAF) 1973    chronically on Dilantin, no seizure in 25 years, followed by Neurologist (Dr. Wiliam Ke) at [facility]   ??? Shortening of arm, congenital     right arm       PSHx:  Past Surgical History:   Procedure Laterality Date   ??? rectal abscess  2010    surgery done at [facility] the [facility]       Meds:    Medications that the patient states to be currently taking   Medication Sig   ??? ASPIRIN ADULT LOW STRENGTH 81 MG EC tablet TAKE 1 TABLET (81 MG TOTAL) BY MOUTH DAILY.   ??? benzoyl peroxide-erythromycin 5-3% gel Apply to acne once daily.Marland Kitchen   ??? ERGOCALCIFEROL 50000 units capsule TAKE 1 CAPSULE (50,000 UNITS TOTAL) BY MOUTH EVERY SEVEN (7) DAYS FOR 24 DOSES.   ??? ibuprofen 800 mg tablet Take 1 tablet (800 mg total) by mouth every twelve (12) hours as needed for Moderate Pain (Pain Scale 4-6).   ??? phenytoin 100 mg ER capsule Take 1 capsule (100 mg total) by mouth three (3) times daily.   ??? RETIN-A MICRO PUMP 0.04 % gel Apply topically at bedtime.   ??? sulfacetamide-sulfur 9-4.5 % LIQD liquid Apply to face twice daily.       Allergies:  Allergies   Allergen Reactions   ??? Sulfa Antibiotics Angioedema     No anaphylaxis   ??? Amlodipine Other (See Comments)     Lip swelling without anaphylaxis   ??? Carvedilol Angioedema     Lip swelling without anaphylaxis   ??? Losartan Rash     No hive, no anaphylaxis, unclear if truly has rash from this medicine   ??? Penicillins Rash     ''bumps on body''       SocHx:  Social History     Tobacco Use   ??? Smoking status: Never Smoker   ??? Smokeless tobacco: Never Used   Substance Use Topics   ??? Alcohol use: No     Works in Loss adjuster, chart[employer]; single    FamHx:  Family History   Problem Relation Age of Onset   ??? Heart disease Mother    ??? Diabetes Mother    ??? Kidney disease Mother    ??? Stroke Brother    ??? Colon cancer Neg Hx    ??? Prostate cancer Neg Hx    ??? Ovarian cancer Neg Hx    ??? Breast cancer Neg Hx    ??? Hyperlipidemia Neg Hx    ??? Hypertension Neg Hx        Physical Exam:    BP 143/85  ~ Pulse 78  ~ Temp 36.6 ???C (97.8 ???F) (Oral)  ~ Resp 18  ~ Ht 6' (1.829 m) Comment: Pt. self reported height ~ Wt 211 lb (95.7 kg)  ~ SpO2 97% Comment: Room air ~ BMI 28.62 kg/m???   Pain Information (Last Filed)     Score Location Comments Edu?  0-No pain None None None            BP 134/83  ~ Pulse 70  ~ Temp 36.4 ???C (97.5 ???F) (Oral)  ~ Resp 18  ~ Ht 6' (1.829 m)  ~ Wt 209 lb (94.8 kg)  ~ SpO2 97%  ~ BMI 28.35 kg/m???      BP 128/72  ~ Pulse 70  ~ Temp 36.4 ???C (97.5 ???F) (Oral)  ~ Resp 16  ~ Ht 6' (1.829 m)  ~ Wt 205 lb (93 kg)  ~ SpO2 97%  ~ BMI 27.80 kg/m???     Wt Readings from Last 3 Encounters: 09/08/18 212 lb (96.2 kg)   08/05/18 213 lb (96.6 kg)   07/08/18 215 lb (97.5 kg)     BP Readings from Last 3 Encounters:   09/08/18 150/94   08/05/18 147/82   07/08/18 161/103       NAD  No ocular injection  No alopecia  OP clear  No LAD  CTAB  RRR  S/NT/ND  No C/C/E  No rashes  No telangectasia/discoloration/calcinosis/sclerodactyly  No tender joints  No swollen joints  L knee crepitus+ no effusion  No enthesitis  No dactylitis  No myofascial pain  Neuro grossly intact except R extremities atrophy  Peripheral pulses intact      RIGHT         LEFT   TMJ [] []   ~ [] []  TMJ   Shoulder [] []  ~ [] []  Shoulder                Left box: Tender   Elbow [] []  ~ [] []  Elbow               Right box: Swollen   Wrist [] []  ~ [] []  Wrist   CMC1 [] []  ~ [] []  CMC1       MCP5 MCP4 MCP3 MCP2 MCP1         ~ MCP1 MCP2 MCP3 MCP4 MCP5       [] []  [] []  [] []  [] []  [] []   [] []  [] []  [] []  [] []  [] []        PIP5 PIP4 PIP3 PIP2 IP1 ~ IP1 PIP2 PIP3 PIP4 PIP5       [] []  [] []  [] []  [] []  [] []   [] []  [] []  [] []  [] []  [] []        DIP5 DIP4 DIP3 DIP2  ~  DIP2 DIP3 DIP4 DIP5       [] []  [] []  [] []  [] []     [] []  [] []  [] []  [] []       Sacroiliac []  ~             []  Sacroiliac                Hip  [] []  ~        [] []  Hip                  Knee  [] []  ~        [] []  Knee        Ankle    [] []  ~        [] []  Ankle           MTP5 MTP4 MTP3 MTP2 MTP1 ~ MTP1 MTP2 MTP3 MTP4 MCP5       [] []  [] []  [] []  [] []  [] []   [] []  [] []  [] []  [] []  [] []        Toe5 Toe4 Toe3 Toe2 Toe1 ~ Toe1 Toe2 Toe3 Toe4 Toe5        [] []  [] []  [] []  [] []  [] []   [] []  [] []  [] []  [] []  [] []   Labs:  Lab Results   Component Value Date    WBC 5.08 05/27/2017    HGB 14.3 05/27/2017    HCT 42.8 05/27/2017    PLT 163 05/27/2017    NEUTABS 4.0 11/28/2014    LYMPHABS 1.7 11/28/2014     Lab Results   Component Value Date    GLUCOSE 104 (H) 05/27/2017    GFREST >89 04/18/2014    CREAT 0.62 05/27/2017    CALCIUM 9.1 05/27/2017    TOTPRO 7.6 05/27/2017    ALBUMIN 4.7 05/27/2017    AST 24 05/27/2017    ALT 24 05/27/2017 ALKPHOS 111 05/27/2017     @RRCOAGS @  Lab Results   Component Value Date    TSH 1.5 01/21/2017       Rheum labs:  Lab Results   Component Value Date    SRWEST 13 (H) 05/27/2017    SRWEST 15 (H) 11/28/2014     No results found for: CARDLIPIGA, CARDLIPIGG, CARDLIPIGM, ANAAB, ANAABTITER, B2GLYPROIGA, B2GLYPROIGG, B2GLYPROIGM, DRVVT, RNPAB, SMAB, SSA  No results found for: C3, C4, DSDNAAB, NDNAABIFA  No results found for: Lyman Bishop, MPOAB  Lab Results   Component Value Date    CKTOT 68 04/23/2008    URICACID 3.6 11/28/2014     Lab Results   Component Value Date    VITD25OH 37 08/05/2018    VITD25OH 45 01/10/2018     UA:   Lab Results   Component Value Date    PROTCLUR Trace 04/23/2008    BLDUR Negative 04/23/2008    LEUKESTUR Negative 04/23/2008    RBCSUR 5 04/23/2008    WBCSUR 3 04/23/2008       Studies: [x] reviewed [] ordered [] independently reviewed.    Imaging:    Knees XR 08/2017   Mild osteoarthritis of the left knee.  No significant arthritis of the right knee.    Knees 2007 normal     ASSESSMENT AND PLAN  SIAH STEELY is a 62 y.o. male     Knees OA stable on prn prn ibuprofen 800 mg as needed; no GERD; pt takes rarely; recent slight worsening with effusion in L knee  Hx of epilepsy stable   pre-diabetic, HTN, HLD  Hx vit D deficiency WNL 02/2017  overweight    RECOMMENDATIONS  - Refill ibuprofen 800 mg as PRN for knee pain/sied effects discussed  - labs NSAID toxicity labs  - weight reduction  - L knee aspiration CSI  - continue to loose weight  - explain in detail and all questions answered      Procedure Note:  L knee aspiration steroid injection  Physician performing injection: Liana Gerold, MD, Rheumatology      The risks, benefits, and alternatives of the procedure were explained, and verbal consent was obtained.  The risks and benefits of the injection were discussed with the patient including but not limited to the risk of allergic reactions, infection, flare reaction, damage to local nerves, tendons or vessels, skin color changes (usually hyperpigmentation), local fat atrophy, the risk of blood sugar elevations and a hyperactivity response. The patient understands these risks elected to proceed.    Area over medial aspect of knee was prepped with betadyne and numbed with 1% xylocaine. 22-gauge needle was inserted, and Kenalog 40mg  + 4cc 1% xylocaine were injected. Patient tolerated procedure well without any immediate complications. Patient was instructed to monitor for s/s of reaction to steroid and infection.  ED precaution discussed.  Ice applied immediately at site of injection.  7 cc of clear synovial fluid was removed prior to the injection. Synovial fluid sent for further analysis including crystals and cell count.      Orders Placed This Encounter   ??? Inject/Drain Knee   ??? CBC & Auto Differential   ??? Comprehensive Metabolic Panel   ??? Crystals,Fluid   ??? Joint Fluid Cell Count & Differential   ??? ibuprofen 800 mg tablet             RTC  3 months or sooner as needed.  CC note to PCP/referred physician        Orders Placed This Encounter   ??? ibuprofen 800 mg tablet           IMMUNIZATION STATUS  Immunization History   Administered Date(s) Administered   ??? Tdap 03/08/2016   ??? influenza vaccine IM quadrivalent (Fluarix Quad) (PF) SYR (16 months of age and older) 08/04/2017, 09/08/2018   ??? influenza vaccine IM quadrivalent (Fluzone Quad) (PF) SYR/SDV (76 months of age and older) 08/06/2016   ??? influenza, unspecified formulation 08/06/2016, 08/04/2017     Thank you for allowing me to participate in this patient's care.  If I can be of further service, please contact my office at  [phone].  [x]  >50%  of this * minute visit was spent in face-to-face evaluation and counseling the patient.  The above plan of care, diagnosis, orders, and follow-up were discussed with the patient. Questions related to this recommended plan of care were answered  []  Immunosuppression drugs changed. [x]  Risks/benefits/toxicities of meds discussed.   []  Reviewed and summarized old records. []  Requested outside medical records.  []  Discussed with other providers of complex medical conditions and management plans outlined above    Liana Gerold MD Rheumatology Division, [place] [phone])

## 2018-09-16 ENCOUNTER — Ambulatory Visit: Payer: BLUE CROSS/BLUE SHIELD

## 2018-09-16 ENCOUNTER — Telehealth: Payer: MEDICARE

## 2018-09-16 DIAGNOSIS — M25462 Effusion, left knee: Secondary | ICD-10-CM

## 2018-09-16 DIAGNOSIS — Z791 Long term (current) use of non-steroidal anti-inflammatories (NSAID): Secondary | ICD-10-CM

## 2018-09-16 DIAGNOSIS — M170 Bilateral primary osteoarthritis of knee: Secondary | ICD-10-CM

## 2018-09-16 MED ORDER — IBUPROFEN 800 MG PO TABS
800 mg | ORAL_TABLET | Freq: Two times a day (BID) | ORAL | 0 refills | Status: AC | PRN
Start: 2018-09-16 — End: 2019-03-04

## 2018-09-16 NOTE — Patient Instructions
Please ice injection site few times today and tomorrow, about 10-15 minutes. Please do not overuse the injected joint/area for 48 hours after injection. Please call me for any signs of infection, such as fever, chills, local warmth, redness, and/or pain.  Please keep your appointments. Thank you!   Labs today

## 2018-09-16 NOTE — Telephone Encounter
I placed refill ibuprofen today-I did not cancel.  See medication session.    Thanks Byrd Hesselbach

## 2018-09-16 NOTE — Telephone Encounter
Medication Verification    MD: Ezekiel Ina    Medication: IBprofen    Caller would like to verify the following (Please check all that applies):    []  Dosage    []  Quantity      []  Directions     Additional information?  [name] from the pharmacy is stating that they had received a request to cancel IBProfen on 09/14/18.  [name] is just calling to confirm cancellation.  Please assist.    Thanks,     Patient or caller has been notified of the 24-48 hour turnaround time.

## 2018-09-17 NOTE — Telephone Encounter
Called and spoke [name] from [facility].  [name] will process the Ibuprofen for pt today.   No further assistance is needed at this time.

## 2018-09-29 ENCOUNTER — Ambulatory Visit: Payer: PRIVATE HEALTH INSURANCE

## 2018-09-29 DIAGNOSIS — M170 Bilateral primary osteoarthritis of knee: Secondary | ICD-10-CM

## 2018-09-29 NOTE — Patient Instructions
We discussed the risks/benefits for the atorvastatin.  I understand that you are concerned with the possible side effects of this medicine.  We will stop it for now, and I strongly recommend maximizing the

## 2018-09-29 NOTE — Progress Notes
[facility] INTERNAL MEDICINE & PEDIATRICS - Office Note  [facility]  [address]  [place] [address]  Phone: [phone]  FAX: [phone]  [phone]     Subjective:   CC:  Follow-up    HPI:  Dustin Watts is a 61 y.o. male who presents for the following issue(s).    1. Hyperlipidemia  Timing (onset/duration) - chronic  Severity - ASCVD score shown below  Context - is overweight and prediabetes  Modifying factors - started the atorvastatin but concerned that this is causing stomach bloating and headaches    2018 ACC/AHA guidelines recommends moderate or high-intensity statin because 10--year ASCVD risk>=7.5%. Patient is receiving guideline-directed statin therapy; monitor adherence.  10-year ASCVD risk  is 14.8%. Continue moderate or high-intensity statin therapy. Monitor adherence. as of 4:48 PM on 09/30/2018        Review of Systems  As above in the HPI and also a 14 point review of systems was reviewed with the patient (see document filled out by the patient today scanned into [employer]).    Past Medical History  The following past medical history was reviewed with the patient.  He has a past medical history of Acne, Arthritis, Cerebral palsy (HCC/RAF), Eczema, Hypertension, Impaired glucose tolerance (03/23/2016), Low vitamin D level (05/07/2016), Seizure (HCC/RAF) (1973), and Shortening of arm, congenital.    Medications/Supplements  The following medication list was reviewed with the patient.    Current Outpatient Medications:   ???  ASPIRIN ADULT LOW STRENGTH 81 MG EC tablet, TAKE 1 TABLET (81 MG TOTAL) BY MOUTH DAILY., Disp: 90 tablet, Rfl: 3  ???  atorvastatin 40 mg tablet, Take 1 tablet (40 mg total) by mouth daily. (Patient not taking: Reported on 09/16/2018.), Disp: 90 tablet, Rfl: 3  ???  benzoyl peroxide-erythromycin 5-3% gel, Apply to acne once daily.., Disp: 46.6 g, Rfl: 11 ???  ERGOCALCIFEROL 50000 units capsule, TAKE 1 CAPSULE (50,000 UNITS TOTAL) BY MOUTH EVERY SEVEN (7) DAYS FOR 24 DOSES., Disp: 12 capsule, Rfl: 1  ???  ibuprofen 800 mg tablet, Take 1 tablet (800 mg total) by mouth every twelve (12) hours as needed for Moderate Pain (Pain Scale 4-6)., Disp: 180 tablet, Rfl: 0  ???  phenytoin 100 mg ER capsule, Take 1 capsule (100 mg total) by mouth three (3) times daily., Disp: 270 capsule, Rfl: 3  ???  RETIN-A MICRO PUMP 0.04 % gel, Apply topically at bedtime., Disp: 50 g, Rfl: 11  ???  sulfacetamide-sulfur 9-4.5 % LIQD liquid, Apply to face twice daily., Disp: 454 g, Rfl: 11    Objective:   Physical Exam  BP 160/102  ~ Pulse 98  ~ Temp 36.4 ???C (97.5 ???F) (Tympanic)  ~ Ht 6' (1.829 m)  ~ Wt 208 lb (94.3 kg)  ~ SpO2 96%  ~ BMI 28.21 kg/m???    Wt Readings from Last 3 Encounters:   09/29/18 208 lb (94.3 kg)   09/16/18 211 lb (95.7 kg)   09/08/18 212 lb (96.2 kg)   Constitutional - alert, no distress, overweight  Eyes - clear conjunctiva, no discharge  Ears, Nose, Mouth and Throat - lips normal, mucosa normal  Neck - supple, midline trachea  Skin - normal texture, no rash  Neuro - no gross focal deficits, normal speech  Psychiatric - normal insight and judgement, normal mental status    A trained chaperone was present for this exam/procedure: No  Reason why a trained chaperone was  not present: Patient declined      Labs  I personally reviewed the following labs  Lab Results   Component Value Date    CHOL 257 08/05/2018    CHOLHDL 54 08/05/2018    CHOLDLCAL 166 (H) 08/05/2018    TRIGLY 187 (H) 08/05/2018       Assessment/Plan:       ICD-10-CM    1. Elevated cholesterol with elevated triglycerides E78.2    2. Overweight E66.3    3. Primary osteoarthritis of both knees M17.0          Problem   Elevated Cholesterol With Elevated Triglycerides    01/21/2017 - elevated lipids again 2 months ago (ASCVD score about 10%) but patient declines new 06/03/2017 - ASCVD score >7.5, would benefit from statin but patient declines, discussed benefits/risks again including the risk of heart disease and death, and patient prefers to continue with exercise and diet changes.    12/02/2017- ASCVD score still elevated, discussed risks again as shown above, patient declines again, will monitor every 6 months or so.  Counseled on diet/exercise again.  08/05/2018 - ASCVD 13%, again counseled on statin.  Will prescribe Lipitor 40 and pt to consider  09/29/2018 - chronic, stable, declines to continue Lipitor (headaches, bloating), will proceed with diet/exercise, and monitor      Overweight    04/02/2016 - neg screen for OSA  09/29/2018 - chronic, mostly stable, discussed diet modifications      Osteoarthritis of Both Knees    03/19/2016 - followed by rheum, doing well on ibuprofen prn and knee brace  05/07/2016 - counseled to take ibuprofen infrequently to avoid side effects including worsening of the BP  10/22/2016 - infrequent use of ibuprofen (a couple times a week), counseled to maintain low frequency of use because of HTN  01/21/2017 - no current pain, will only take ibuprofen infrequently, no role for imaging or PT at this time.  Counseled on weight loss.  07/01/2017- mild pain, infrequent Tylenol, no NSAIDs, no change in this current management and encouraged to increase exercise regimen.  09/29/2018 - chronic, stable, f/u with Dr Myrtice Lauth (in this office) or Rheumatologist for arthrocentesis if fluid recurs          The above plan of care, diagnosis, orders, and follow-up were discussed with the patient.  Questions related to this recommended plan of care were answered.    Follow up: Return if symptoms worsen or fail to improve.    Lendon Collar Vic Blackbird, MD, MPH  09/30/2018 at 4:48 PM  Time of note filed does not necessarily reflect the time of encounter.  The documentation and medical complexity of this visit are consistent with a 99214 as shown by the following: CHIEF COMPLAINT: documented  HPI: Four elements and/or status of three chronic conditions documented  PAST HISTORY: At least one item (medical, family or social) documented  REVIEW OF SYSTEMS: At least two systems documented  EXAM: At least five organ systems documented  MEDICAL DECISION MAKING: medium complexity as shown by the following:  Problems (3 points total)  [x ] Established problem, stable or improving - 1 point - 3 points total for cholesterol, overweight, osteroarthritis  Risk level of Moderate Risk (any one of the following)  [x ] Two or more stable chronic illnesses - cholesterol, overweight, osteroarthritis

## 2018-10-31 ENCOUNTER — Ambulatory Visit: Payer: MEDICARE

## 2018-12-07 ENCOUNTER — Telehealth: Payer: BLUE CROSS/BLUE SHIELD

## 2018-12-07 NOTE — Telephone Encounter
Called pt because he has an appt scheduled on 12/08/18 & 12/09/18. Left message asking him to call back and confirm which appointment he will be keeping.    Thanks,  Merla Riches.

## 2018-12-08 ENCOUNTER — Ambulatory Visit: Payer: BLUE CROSS/BLUE SHIELD

## 2018-12-09 ENCOUNTER — Ambulatory Visit: Payer: MEDICAID

## 2018-12-12 MED ORDER — IBUPROFEN 800 MG PO TABS
ORAL_TABLET | 0 refills
Start: 2018-12-12 — End: ?

## 2018-12-14 NOTE — Progress Notes
RHEUMATOLOGY OUTPATIENT CONSULTATION  PATIENT: Dustin Watts MRN: [id number] DOB: December 01, 1955  REASONS FOR VISIT/CHIEF COMPLAINT:OA      HPI:62 y.o. male former pt of Dr Roselee Nova with generalized OA, OK knees here to establish care.  Hx of epilepsy, pre-diabetic, HTN, HLD, vit D deficiency    Current visit patient reports the followings:-    09/2018  - overall good no falls; L knee some swelling and worsening pain wondering if take fluid out or if any fluid inside  - ibuprofen as needed 800 mg 2 times a day only prn for years no problem with food no indigestion  - otherwise doing well      11/2017   -last visit increasing knee pain XR R knee good L knee mild OA   - doing well; no seizure any longer  - joints are not bad; no pain with walking; no need to take ibuprofen regularly; takes infrequently- a day before and on day out e[place]g going [place]; requesting refill; no problem no GERD/taking with food    08/2017  -last visit 02/2017 joints are doing fine on prn NSAID/post CVA  -knees are doing not too badly ''only after long walk'' ''described as blown up'', keen to have refill ibuprofen 800 mg PRN given by Dr Roselee Nova in past ''I only take it if I have a long walk to do so taking infrequently''; no stiffness; at times puffy not today; slight worse lately with pain   -other joints are fine  -chronic R sided atrophy since borne Dx cerebral palsy otherwise in good health  -no seizure for some time now      No known history of malar rash, photosensitivity rash, discoid lesions, oral/nasal ulcers,  pleuritic chest pain, seizures/psychosis, Raynaud's, skin tightness, dry eyes/mouth, hair loss or blood clots.  No history of muscle pain or proximal myopathy.  No history for fever, weight loss, loss of appetite, night sweat or lymphadenopathy.  No history of skin psoriasis, eye issues, STDs or IBD.    No recent travelling.  No sick contacts.    Prior medication/Joint injections/Immune suppressant therapy:  None    ROS: A 14 point ROS was obtained and significant for what is mentioned in HPI and below.    ROS    (Check if Normal, or note + findings):   []    reviewed questionnaire on 12/14/2018    []   Not Obtainable :   [x]    Constit : [-] fever, [-] weight loss    [x]    Skin :[-] vasculitic palpable rash, [-] tender nodules, [-]scaling plaques      [x]    Eyes : [-] photophobia, [-] red eye [-] gritty eyes    [x]    CV :[-] LE swelling, orthopnea, [-] PND,    []    MS :     [x]    ENT: [-] ear /nose swelling, [-] throat pain or post nasal d/c   [x]    GI :[-] constipation [-] diarrhea  [-] melena [-]heartburn,   [x]    Heme/Lymph :[-] easy bruising or bleeding      [x]   Resp : [-] DOE, [-] wheezing [-] pleurisy   [x]    GU:[-]dysruria, [-] gross hematuria   []    Neuro :[-] focal neurological [-] wrist or foot drop     [x]    Imm/All :[-] recurrent infections in past, [-] pollen or seasonal allergy   [x]   Endo :   [x]    Psych: [-] mood, [-] memory  PMHx:  Past Medical History:   Diagnosis Date   ??? Acne    ??? Arthritis    ??? Cerebral palsy (HCC/RAF)    ??? Eczema    ??? Hypertension    ??? Impaired glucose tolerance 03/23/2016    HGBA1C of 5.7 on 03/19/2016    ??? Low vitamin D level 05/07/2016    05/07/2016 - taking antiepiletpic which may exacerbate it, started on ergo 50000, will recheck level in about 2 months    ??? Seizure (HCC/RAF) 1973    chronically on Dilantin, no seizure in 25 years, followed by Neurologist (Dr. Wiliam Ke) at [facility]   ??? Shortening of arm, congenital     right arm       PSHx:  Past Surgical History:   Procedure Laterality Date   ??? rectal abscess  2010    surgery done at [facility]       Meds:    No outpatient medications have been marked as taking for the 12/23/18 encounter (Appointment) with Liana Gerold, MD, MS.       Allergies:  Allergies   Allergen Reactions   ??? Sulfa Antibiotics Angioedema     No anaphylaxis   ??? Amlodipine Other (See Comments)     Lip swelling without anaphylaxis ??? Carvedilol Angioedema     Lip swelling without anaphylaxis   ??? Losartan Rash     No hive, no anaphylaxis, unclear if truly has rash from this medicine   ??? Penicillins Rash     ''bumps on body''       SocHx:  Social History     Tobacco Use   ??? Smoking status: Never Smoker   ??? Smokeless tobacco: Never Used   Substance Use Topics   ??? Alcohol use: No     Works in Loss adjuster, chartered; single    FamHx:  Family History   Problem Relation Age of Onset   ??? Heart disease Mother    ??? Diabetes Mother    ??? Kidney disease Mother    ??? Stroke Brother    ??? Colon cancer Neg Hx    ??? Prostate cancer Neg Hx    ??? Ovarian cancer Neg Hx    ??? Breast cancer Neg Hx    ??? Hyperlipidemia Neg Hx    ??? Hypertension Neg Hx        Physical Exam:    BP 143/85  ~ Pulse 78  ~ Temp 36.6 ???C (97.8 ???F) (Oral)  ~ Resp 18  ~ Ht 6' (1.829 m) Comment: Pt. self reported height ~ Wt 211 lb (95.7 kg)  ~ SpO2 97% Comment: Room air ~ BMI 28.62 kg/m???   Pain Information     No pain information on file            BP 134/83  ~ Pulse 70  ~ Temp 36.4 ???C (97.5 ???F) (Oral)  ~ Resp 18  ~ Ht 6' (1.829 m)  ~ Wt 209 lb (94.8 kg)  ~ SpO2 97%  ~ BMI 28.35 kg/m???      BP 128/72  ~ Pulse 70  ~ Temp 36.4 ???C (97.5 ???F) (Oral)  ~ Resp 16  ~ Ht 6' (1.829 m)  ~ Wt 205 lb (93 kg)  ~ SpO2 97%  ~ BMI 27.80 kg/m???     Wt Readings from Last 3 Encounters:   09/29/18 208 lb (94.3 kg)   09/16/18 211 lb (95.7 kg)   09/08/18 212 lb (96.2  kg)     BP Readings from Last 3 Encounters:   09/29/18 160/102   09/16/18 143/85   09/08/18 150/94       NAD  No ocular injection  No alopecia  OP clear  No LAD  CTAB  RRR  S/NT/ND  No C/C/E  No rashes  No telangectasia/discoloration/calcinosis/sclerodactyly  No tender joints  No swollen joints  L knee crepitus+ no effusion  No enthesitis  No dactylitis  No myofascial pain  Neuro grossly intact except R extremities atrophy  Peripheral pulses intact      RIGHT         LEFT   TMJ [] []   ~ [] []  TMJ   Shoulder [] []  ~ [] []  Shoulder                Left box: Tender Elbow [] []  ~ [] []  Elbow               Right box: Swollen   Wrist [] []  ~ [] []  Wrist   CMC1 [] []  ~ [] []  CMC1       MCP5 MCP4 MCP3 MCP2 MCP1         ~ MCP1 MCP2 MCP3 MCP4 MCP5       [] []  [] []  [] []  [] []  [] []   [] []  [] []  [] []  [] []  [] []        PIP5 PIP4 PIP3 PIP2 IP1 ~ IP1 PIP2 PIP3 PIP4 PIP5       [] []  [] []  [] []  [] []  [] []   [] []  [] []  [] []  [] []  [] []        DIP5 DIP4 DIP3 DIP2  ~  DIP2 DIP3 DIP4 DIP5       [] []  [] []  [] []  [] []     [] []  [] []  [] []  [] []       Sacroiliac []  ~             []  Sacroiliac                Hip  [] []  ~        [] []  Hip                  Knee  [] []  ~        [] []  Knee        Ankle    [] []  ~        [] []  Ankle           MTP5 MTP4 MTP3 MTP2 MTP1 ~ MTP1 MTP2 MTP3 MTP4 MCP5       [] []  [] []  [] []  [] []  [] []   [] []  [] []  [] []  [] []  [] []        Toe5 Toe4 Toe3 Toe2 Toe1 ~ Toe1 Toe2 Toe3 Toe4 Toe5        [] []  [] []  [] []  [] []  [] []   [] []  [] []  [] []  [] []  [] []                      Labs:  Lab Results   Component Value Date    WBC 5.08 05/27/2017    HGB 14.3 05/27/2017    HCT 42.8 05/27/2017    PLT 163 05/27/2017    NEUTABS 4.0 11/28/2014    LYMPHABS 1.7 11/28/2014     Lab Results   Component Value Date    GLUCOSE 104 (H) 05/27/2017    GFREST >89 04/18/2014    CREAT 0.62 05/27/2017    CALCIUM 9.1 05/27/2017    TOTPRO 7.6 05/27/2017    ALBUMIN 4.7 05/27/2017    AST 24 05/27/2017    ALT 24 05/27/2017    ALKPHOS 111 05/27/2017     @RRCOAGS @  Lab Results   Component Value Date    TSH 1.5 01/21/2017       Rheum labs:  Lab Results   Component Value Date    SRWEST 13 (H) 05/27/2017    SRWEST 15 (H) 11/28/2014     No results found for: CARDLIPIGA, CARDLIPIGG, CARDLIPIGM, ANAAB, ANAABTITER, B2GLYPROIGA, B2GLYPROIGG, B2GLYPROIGM, DRVVT, RNPAB, SMAB, SSA  No results found for: C3, C4, DSDNAAB, NDNAABIFA  No results found for: Lyman Bishop, MPOAB  Lab Results   Component Value Date    CKTOT 68 04/23/2008    URICACID 3.6 11/28/2014     Lab Results   Component Value Date    VITD25OH 37 08/05/2018    VITD25OH 45 01/10/2018     UA: Lab Results   Component Value Date    PROTCLUR Trace 04/23/2008    BLDUR Negative 04/23/2008    LEUKESTUR Negative 04/23/2008    RBCSUR 5 04/23/2008    WBCSUR 3 04/23/2008       Studies: [x] reviewed [] ordered [] independently reviewed.    Imaging:    Knees XR 08/2017   Mild osteoarthritis of the left knee.  No significant arthritis of the right knee.    Knees 2007 normal     ASSESSMENT AND PLAN  Dustin Watts is a 63 y.o. male     Knees OA stable on prn prn ibuprofen 800 mg as needed; no GERD; pt takes rarely; recent slight worsening with effusion in L knee  Hx of epilepsy stable   pre-diabetic, HTN, HLD  Hx vit D deficiency WNL 02/2017  overweight    RECOMMENDATIONS  - Refill ibuprofen 800 mg as PRN for knee pain/sied effects discussed  - labs NSAID toxicity labs  - weight reduction  - L knee aspiration CSI  - continue to loose weight  - explain in detail and all questions answered      Procedure Note:  L knee aspiration steroid injection  Physician performing injection: Liana Gerold, MD, Rheumatology      The risks, benefits, and alternatives of the procedure were explained, and verbal consent was obtained.  The risks and benefits of the injection were discussed with the patient including but not limited to the risk of allergic reactions, infection, flare reaction, damage to local nerves, tendons or vessels, skin color changes (usually hyperpigmentation), local fat atrophy, the risk of blood sugar elevations and a hyperactivity response. The patient understands these risks elected to proceed.    Area over medial aspect of knee was prepped with betadyne and numbed with 1% xylocaine. 22-gauge needle was inserted, and Kenalog 40mg  + 4cc 1% xylocaine were injected. Patient tolerated procedure well without any immediate complications. Patient was instructed to monitor for s/s of reaction to steroid and infection.  ED precaution discussed.  Ice applied immediately at site of injection. 7 cc of clear synovial fluid was removed prior to the injection. Synovial fluid sent for further analysis including crystals and cell count.      Orders Placed This Encounter   ??? Inject/Drain Knee   ??? CBC & Auto Differential   ??? Comprehensive Metabolic Panel   ??? Crystals,Fluid   ??? Joint Fluid Cell Count & Differential   ??? ibuprofen 800 mg tablet             RTC  3 months or sooner as needed.  CC note to PCP/referred physician        Orders Placed This Encounter   ??? ibuprofen 800 mg tablet  IMMUNIZATION STATUS  Immunization History   Administered Date(s) Administered   ??? Tdap 03/08/2016   ??? influenza vaccine IM quadrivalent (Fluarix Quad) (PF) SYR (40 months of age and older) 08/04/2017, 09/08/2018   ??? influenza vaccine IM quadrivalent (Fluzone Quad) (PF) SYR/SDV (100 months of age and older) 08/06/2016   ??? influenza, unspecified formulation 08/06/2016, 08/04/2017     Thank you for allowing me to participate in this patient's care.  If I can be of further service, please contact my office at  [phone].  [x]  >50%  of this * minute visit was spent in face-to-face evaluation and counseling the patient.  The above plan of care, diagnosis, orders, and follow-up were discussed with the patient. Questions related to this recommended plan of care were answered  []  Immunosuppression drugs changed. [x]  Risks/benefits/toxicities of meds discussed.   []  Reviewed and summarized old records. []  Requested outside medical records.  []  Discussed with other providers of complex medical conditions and management plans outlined above    Liana Gerold MD Rheumatology Division, [place] [phone])

## 2018-12-15 ENCOUNTER — Ambulatory Visit: Payer: BLUE CROSS/BLUE SHIELD

## 2018-12-20 MED ORDER — ASPIRIN LOW DOSE 81 MG PO TBEC
ORAL_TABLET | 3 refills | Status: AC
Start: 2018-12-20 — End: ?

## 2018-12-23 ENCOUNTER — Ambulatory Visit: Payer: BLUE CROSS/BLUE SHIELD

## 2018-12-27 ENCOUNTER — Ambulatory Visit: Payer: MEDICAID

## 2019-01-05 ENCOUNTER — Ambulatory Visit: Payer: MEDICAID

## 2019-01-06 ENCOUNTER — Ambulatory Visit: Payer: BLUE CROSS/BLUE SHIELD

## 2019-01-06 DIAGNOSIS — K429 Umbilical hernia without obstruction or gangrene: Secondary | ICD-10-CM

## 2019-01-06 NOTE — Progress Notes
[facility] INTERNAL MEDICINE & PEDIATRICS - Office Note  [facility]  [address]  [place] [address]  Phone: [phone]  FAX: [phone]  [phone]     Subjective:   CC:  Follow-up (last visit, blood work)    HPI:  Dustin Watts is a 63 y.o. male who presents for the following issue(s).    1. Plantar wart  Timing (onset/duration) - chronic  Location - distal L plantar surface  Severity - mild discomfort at night  Quality - ''pulsating''  Modifying factors - multiple cryotherapy in the last and also OTC treatments  Associated signs and symptoms - none      Review of Systems  As above in the HPI and also a 14 point review of systems was reviewed with the patient (see document filled out by the patient today scanned into [facility]).    Past Medical History  The following past medical history was reviewed with the patient.  He has a past medical history of Acne, Arthritis, Cerebral palsy (HCC/RAF), Eczema, Hypertension, Impaired glucose tolerance (03/23/2016), Low vitamin D level (05/07/2016), Seizure (HCC/RAF) (1973), and Shortening of arm, congenital.    Medications/Supplements  The following medication list was reviewed with the patient.    Current Outpatient Medications:   ???  ASPIRIN LOW DOSE 81 MG EC tablet, TAKE 1 TABLET BY MOUTH EVERY DAY, Disp: 90 tablet, Rfl: 3  ???  atorvastatin 40 mg tablet, Take 1 tablet (40 mg total) by mouth daily. (Patient not taking: Reported on 09/16/2018.), Disp: 90 tablet, Rfl: 3  ???  benzoyl peroxide-erythromycin 5-3% gel, Apply to acne once daily.., Disp: 46.6 g, Rfl: 11  ???  ibuprofen 800 mg tablet, Take 1 tablet (800 mg total) by mouth every twelve (12) hours as needed for Moderate Pain (Pain Scale 4-6)., Disp: 180 tablet, Rfl: 0  ???  phenytoin 100 mg ER capsule, Take 1 capsule (100 mg total) by mouth three (3) times daily., Disp: 270 capsule, Rfl: 3 ???  RETIN-A MICRO PUMP 0.04 % gel, Apply topically at bedtime., Disp: 50 g, Rfl: 11  ???  sulfacetamide-sulfur 9-4.5 % LIQD liquid, Apply to face twice daily., Disp: 454 g, Rfl: 11    Objective:   Physical Exam  BP 145/90 (BP Location: Left arm, Patient Position: Sitting)  ~ Pulse 99  ~ Temp 36.5 ???C (97.7 ???F) (Oral)  ~ Resp 18  ~ Ht 1.829 m (6')  ~ Wt 99.3 kg (219 lb)  ~ SpO2 98%  ~ BMI 29.70 kg/m???    Wt Readings from Last 3 Encounters:   01/06/19 99.3 kg (219 lb)   09/29/18 94.3 kg (208 lb)   09/16/18 95.7 kg (211 lb)   Constitutional - alert, no distress, overweight  Eyes - clear conjunctiva, no discharge  Ears, Nose, Mouth and Throat - lips normal, mucosa normal  Neck - supple, midline trachea  GI: reducible 2.5cm umbilical hernia  Skin - normal texture, no rash, punctate verrucae on the distal L plantar surface  Neuro - no gross focal deficits, normal speech  Psychiatric - normal insight and judgement, normal mental status    A trained chaperone was present for this exam/procedure: No  Reason why a trained chaperone was not present: Patient declined      Labs  I personally reviewed the following labs  Lab Results   Component Value Date    NA 143 05/27/2017    K 4.9 05/27/2017  CL 103 05/27/2017    CO2 24 05/27/2017    BUN 11 05/27/2017    CREAT 0.62 05/27/2017    GLUCOSE 104 (H) 05/27/2017    CALCIUM 9.1 05/27/2017    ALT 24 05/27/2017    AST 24 05/27/2017    ALKPHOS 111 05/27/2017    BILITOT 0.3 05/27/2017     Lab Results   Component Value Date    WBC 5.08 05/27/2017    HGB 14.3 05/27/2017    HCT 42.8 05/27/2017    MCV 101.2 (H) 05/27/2017    PLT 163 05/27/2017     Lab Results   Component Value Date    HGBA1C 5.8 (H) 08/05/2018         Assessment/Plan:       ICD-10-CM    1. Overweight E66.3    2. Umbilical hernia without obstruction and without gangrene K42.9 Referral to Surgery   3. Plantar wart of left foot B07.0          Problem   Plantar Wart of Left Foot Two on plantar aspect at base of 2nd and 3rd toes.  Multiple cryotherapy since Oct 2018  07/08/2018- mild, cryo again today  08/05/2018 - normal appearing plantar surface on exam today, counseled patient that cryotherapy is not needed today  09/08/2018 - resolved  01/06/2019 - recurred, cryotherapy again today, f/u prn 1 month     Umbilical Hernia    05/27/2017 - reducible, unlikely to be the main cause of the abd pain, unless becomes symptoms does not require further workup at this time, will monitor.  10/07/2017 - counseled on indications for additional imaging or surgical management, advised f/u if any worsening.  07/08/2018- stable, no treatment at this time as rarely symptoms with only mild discomfort  01/06/2019 - occasionally symptoms, discussed role of surgery and will refer       Overweight    04/02/2016 - neg screen for OSA  01/06/2019 - worsening, long dietary counseling today, will increase vegetables, also discussed healthy portion size         The above plan of care, diagnosis, orders, and follow-up were discussed with the patient.  Questions related to this recommended plan of care were answered.    Follow up: Return for wart spray.    Lendon Collar Vic Blackbird, MD, MPH  01/06/2019 at 10:13 AM  Time of note filed does not necessarily reflect the time of encounter.  More than 25 minutes were spent face-to-face with the patient during this encounter and over half of that time was spent on counseling and coordination of care for warts, overweight, and umbilical hernia and I answered questions regarding the treatment plan I outlined above.

## 2019-01-12 ENCOUNTER — Telehealth: Payer: Medicaid HMO

## 2019-01-12 ENCOUNTER — Ambulatory Visit: Payer: BLUE CROSS/BLUE SHIELD

## 2019-01-12 ENCOUNTER — Telehealth: Payer: MEDICARE

## 2019-01-12 DIAGNOSIS — L700 Acne vulgaris: Secondary | ICD-10-CM

## 2019-01-12 MED ORDER — SULFACETAMIDE SODIUM-SULFUR 9-4.5 % EX LIQD
11 refills | Status: AC
Start: 2019-01-12 — End: ?

## 2019-01-12 MED ORDER — SULFACETAMIDE SODIUM-SULFUR 9-4.5 % EX LIQD
11 refills | Status: AC
Start: 2019-01-12 — End: 2019-01-13

## 2019-01-12 MED ORDER — BENZOYL PEROXIDE-ERYTHROMYCIN 5-3 % EX GEL
11 refills | Status: AC
Start: 2019-01-12 — End: ?

## 2019-01-12 MED ORDER — BENZOYL PEROXIDE-ERYTHROMYCIN 5-3 % EX GEL
11 refills | Status: AC
Start: 2019-01-12 — End: 2019-01-13

## 2019-01-12 MED ORDER — RETIN-A MICRO PUMP 0.04 % EX GEL
Freq: Every evening | TOPICAL | 11 refills | Status: AC
Start: 2019-01-12 — End: 2019-01-13

## 2019-01-12 MED ORDER — RETIN-A MICRO PUMP 0.04 % EX GEL
Freq: Every evening | TOPICAL | 11 refills | Status: AC
Start: 2019-01-12 — End: ?

## 2019-01-12 NOTE — Telephone Encounter
8-4% is okay. Thanks!

## 2019-01-12 NOTE — Telephone Encounter
Medication Verification    MD: Dr. Otelia Limes  Medication: sulfacetamide-sulfur 9-4.5 % LIQD liquid     Caller would like to verify the following (Please check all that applies):    [x]  Dosage    []  Quantity      []  Directions     Additional information?  Pharmacist stated they have sulfacetamide-sulfur 8-4 % LIQD liquid   Patient or caller has been notified of the 24-48 hour turnaround time.

## 2019-01-12 NOTE — Progress Notes
PATIENT: Dustin Watts  MRN: [id number]  DOB: January 12, 1956  DATE OF SERVICE: 01/12/2019    REFERRING PRACTITIONER: Arbutus Ped, MD  PRIMARY CARE PROVIDER: Murtis Sink., MD, MPH  CHIEF COMPLAINT:   Chief Complaint   Patient presents with   ??? Follow-up   ??? Acne vulgaris       Subjective:      Dustin Watts is a 63 y.o. year old male who was seen in clinic for follow up acne and lichen simplex chronicus.    Last seen 08/18/2017    No major complaints today. Uses retin-a micro pump (wants the specific brand) and sulfacetamide sulfur wash (tolerates fine despite h/o sulfa allergy) and benzamycin gel daily as needed.    Wonders if it could be creams. He puts various serums, collagen cream, roc cream, tightening cream, something from a TV dermatologist.     Past Medical History:   Diagnosis Date   ??? Acne    ??? Arthritis    ??? Cerebral palsy (HCC/RAF)    ??? Eczema    ??? Hypertension    ??? Impaired glucose tolerance 03/23/2016    HGBA1C of 5.7 on 03/19/2016    ??? Low vitamin D level 05/07/2016    05/07/2016 - taking antiepiletpic which may exacerbate it, started on ergo 50000, will recheck level in about 2 months    ??? Seizure (HCC/RAF) 1973    chronically on Dilantin, no seizure in 25 years, followed by Neurologist (Dr. Wiliam Ke) at [facility]   ??? Shortening of arm, congenital     right arm     Past Surgical History:   Procedure Laterality Date   ??? rectal abscess  2010    surgery done at [facility] History   Problem Relation Age of Onset   ??? Heart disease Mother    ??? Diabetes Mother    ??? Kidney disease Mother    ??? Stroke Brother    ??? Colon cancer Neg Hx    ??? Prostate cancer Neg Hx    ??? Ovarian cancer Neg Hx    ??? Breast cancer Neg Hx    ??? Hyperlipidemia Neg Hx    ??? Hypertension Neg Hx        Outpatient Medications Prior to Visit   Medication Sig   ??? ASPIRIN LOW DOSE 81 MG EC tablet TAKE 1 TABLET BY MOUTH EVERY DAY   ??? benzoyl peroxide-erythromycin 5-3% gel Apply to acne once daily.Marland Kitchen ??? ibuprofen 800 mg tablet Take 1 tablet (800 mg total) by mouth every twelve (12) hours as needed for Moderate Pain (Pain Scale 4-6).   ??? phenytoin 100 mg ER capsule Take 1 capsule (100 mg total) by mouth three (3) times daily.   ??? RETIN-A MICRO PUMP 0.04 % gel Apply topically at bedtime.   ??? sulfacetamide-sulfur 9-4.5 % LIQD liquid Apply to face twice daily.   ??? atorvastatin 40 mg tablet Take 1 tablet (40 mg total) by mouth daily. (Patient not taking: Reported on 09/16/2018.)     No facility-administered medications prior to visit.        Allergies   Allergen Reactions   ??? Sulfa Antibiotics Angioedema     No anaphylaxis   ??? Amlodipine Other (See Comments)     Lip swelling without anaphylaxis   ??? Carvedilol Angioedema     Lip swelling without anaphylaxis   ??? Losartan Rash     No hive, no anaphylaxis, unclear  if truly has rash from this medicine   ??? Penicillins Rash     ''bumps on body''         Review of Systems:  Constitutional: negative  Skin:  otherwise negative      Objective:        General:   alert, appears stated age and cooperative     Neurologic:   Grossly normal   Psychiatric:   oriented to time, place and person, mood and affect are within normal limits     Skin Exam:  Skin Type: 4  Head/face: examined  Eyes (lids/conjunctiva): examined  Lips/gums/teeth: examined  Neck: examined  All areas examined were within normal limits with the following exceptions: face appears clear today. Scattered skin colored papules on face.    Lab Visit on 09/16/2018   Component Date Value Ref Range Status   ??? Crystals,Fluid #1 09/16/2018 No Crystals Seen  No Crystals Seen Final   ??? Fluid Appearance 09/16/2018 Viscoid  Yellow   Final   ??? RBC Count,Joint Fl 09/16/2018 <1,000  No Reference Range /cmm Final    The reference interval(s) is not available for this body fluid. Comparison of this result with the concentration in contemporaneous blood is recommended. Result must be interpreted in the clinical context. ??? WBC Count,Joint Fl 09/16/2018 282  No Reference Range /cmm Final    The reference interval(s) is not available for this body fluid. Comparison of this result with the concentration in contemporaneous blood is recommended. Result must be interpreted in the clinical context.     ??? Segmented Neutrophil 09/16/2018 1  No Reference Range % Final    The reference interval(s) is not available for this body fluid. Comparison of this result with the concentration in contemporaneous blood is recommended. Result must be interpreted in the clinical context.   ??? Lymphocyte 09/16/2018 1  No Reference Range % Final    The reference interval(s) is not available for this body fluid. Comparison of this result with the concentration in contemporaneous blood is recommended. Result must be interpreted in the clinical context.   ??? Monocyte 09/16/2018 98  No Reference Range % Final    Includes macrophages     ??? Total Cells 09/16/2018 100  cells Final   Office Visit on 09/08/2018   Component Date Value Ref Range Status   ??? PSA,Screening 09/08/2018 1.1  0 - 4.5 ng/mL Final    Total PSA Reference Range:    Male:  49 - 72 year old:  0.0-2.5 ng/mL  21 - 77 year old:  0.0-3.5 ng/mL  56 - 93 year old:  0.0-4.5 ng/mL  26 - 28 year old:  0.0-6.5 ng/mL    Male:            0.0-0.2 ng/mL           Method: Roche-Electrochemiluminescence  WARNING: The concentration of PSA in a given specimen  determined with assays from different manufacturers can  vary due to differences in assay methods and reagent  specificity. Values obtained with different assay methods  cannot be used interchangeably.      Ingestion of high levels of biotin in dietary supplements may lead to falsely increased results.   Office Visit on 08/05/2018   Component Date Value Ref Range Status   ??? Cholesterol 08/05/2018 257  See Comment mg/dL Final      The significance of total cholesterol depends on the values of LDL, HDL, triglycerides and the clinical context. A patient-provider discussion  may be considered.       ??? Cholesterol,LDL,Calc 08/05/2018 166* <100 mg/dL Final    If LDL value falls outside of the designated range AND if  included in any of the following categories, a  patient-provider discussion is recommended.     Statin therapy is recommended for individuals:  1. with clinical atherosclerotic cardiovascular disease     irrespective of LDL levels;  2. with LDL > or = 190 mg/dL;  3. with diabetes, aged 40-75 years, with LDL between 70 and     189 mg/dL;  4. without any of the above but who have LDL between 70 and     189 mg/dL and an estimated 16-XWRU risk of     atherosclerotic cardiovascular disease > or = 7.5%     (consider statin therapy if estimated 10-year risk > or =     5.0%) (ACC/AHA 2013 Guidelines).   ??? Cholesterol, HDL 08/05/2018 54  >40 mg/dL Final    If HDL cholesterol level falls outside of the designated  range, a patient-provider discussion is recommended   ??? Triglycerides 08/05/2018 187* <150 mg/dL Final      If Triglyceride level falls outside of the designated range,  a patient-provider discussion is recommended.     ??? Non-HDL,Chol,Calc 08/05/2018 203* <130 mg/dL Final    If Non-HDL cholesterol level falls outside of the designated  range, a patient-provider discussion is recommended.   ??? Hgb A1c - HPLC 08/05/2018 5.8* <5.7 % Final    For patients with diabetes, an A1c less than (<) or equal (=) to 7.0% is recommended for most patients, however the goal may be higher or lower depending on age and/or other medical problems.   For a diagnosis of diabetes, A1c greater than (>) or equal(=) to 6.5% indicates diabetes; values between 5.7% and 6.4% may indicate an increased risk of developing diabetes.   ??? Vitamin D,25-Hydroxy 08/05/2018 37  20 - 50 ng/mL Final    Deficiency: less than 12 ng/mL Inadequate: 12-19 ng/mL                                                       Adequate: 20-50 ng/mL                                                             Potential adverse effects: greater than 50 ng/mL                Assessment:           Plan/ Recommendation:        Diagnoses and associated orders for this visit:    Acne - doing well, continue:  - benzoyl peroxide-erythromycin 5-3% gel; Apply to acne once daily.  - tretinoin microspheres (RETIN-A MICRO) 0.04% gel; Apply nightly to affected areas.  - Sulfacetamide Sodium-Sulfur (SUMADAN WASH) 9-4.5 % LIQD; Apply 1 drop topically two (2) times daily. WASH AFFECTED AREA(S) TWICE DAILY AS DIRECTED    Refilled - printed prescriptions provided.    Dermal nevi of face - reassurance      Assessment and plan were discussed with the patient.  Follow up: 12 months or sooner prn     Author:  Arbutus Ped 01/12/2019 8:20 AM

## 2019-01-12 NOTE — Telephone Encounter
Spoke to Milton at [employer] to verify 8-4% is okay.  Syncere Kaminski [place], [place]

## 2019-01-12 NOTE — Telephone Encounter
Dr. Goh,    Please review and advise on message below.    Dedra Matsuo, MA

## 2019-01-12 NOTE — Telephone Encounter
I called Dustin Watts after their Derm appt w/ Dr. Otelia Limes on 01/12/2019 because I accidentally scheduled their appts and printed a AVS on another pt's chart/ I canceled all the appts on incorrect chart and put them under Dustin Watts chart. I left message for pt if they can shred the wrong AVS and we can mail out their AVS. I told pt to call back to let us know if they need it mailed out.     [name]

## 2019-01-19 ENCOUNTER — Telehealth: Payer: MEDICAID

## 2019-01-19 NOTE — Telephone Encounter
Message to Practice/Provider    MD:   Dr. Otelia Limes  Message:   Pt is requesting the correct AVS to be mailed out to him.   Return call is not being requested by the patient or caller.    Patient or caller has been notified of the 24-48 hour processing turnaround time if applicable.

## 2019-01-20 NOTE — Telephone Encounter
Avs mailed

## 2019-01-26 ENCOUNTER — Ambulatory Visit: Payer: BLUE CROSS/BLUE SHIELD

## 2019-01-27 ENCOUNTER — Ambulatory Visit: Payer: MEDICAID

## 2019-02-02 ENCOUNTER — Telehealth: Payer: BLUE CROSS/BLUE SHIELD

## 2019-02-02 ENCOUNTER — Ambulatory Visit: Payer: BLUE CROSS/BLUE SHIELD

## 2019-02-02 NOTE — Telephone Encounter
Message to Practice/Provider    MD: Dr. Vic Blackbird     Message: Pt rescheduled appointment due to a fall and body aching. Pt did go to ER and was prescribed an rx that makes him drowsy. Pt stated for Dr. Vic Blackbird to stay well and he will see him soon.    Return call is not being requested by the patient or caller.    Patient or caller has been notified of the 24-48 hour processing turnaround time if applicable.

## 2019-02-08 ENCOUNTER — Telehealth: Payer: PRIVATE HEALTH INSURANCE

## 2019-02-08 NOTE — Telephone Encounter
Call Back Request    MD:  Dr. Vic Blackbird      Reason for call back: Patient says he has a form that Dr. Noel Gerold needs to fill out so that he can continue care with his chiropractor.      Any Symptoms:  []  Yes  [x]  No      ? If yes, what symptoms are you experiencing:    o Duration of symptoms (how long):    o Have you taken medication for symptoms (OTC or Rx):      Patient or caller has been notified of the 24-48 hour turnaround time.

## 2019-02-08 NOTE — Telephone Encounter
Message to Practice/Provider    MD: Dr. Vic Blackbird     Message: I apologize, just to verify the below mention of Dr. Noel Gerold is an error.  This is in fact for Dr. Vic Blackbird.     Return call is not being requested by the patient or caller.    Patient or caller has been notified of the 24-48 hour processing turnaround time if applicable.

## 2019-02-09 ENCOUNTER — Telehealth: Payer: MEDICAID

## 2019-02-09 DIAGNOSIS — W010XXS Fall on same level from slipping, tripping and stumbling without subsequent striking against object, sequela: Secondary | ICD-10-CM

## 2019-02-09 DIAGNOSIS — R52 Pain, unspecified: Secondary | ICD-10-CM

## 2019-02-09 NOTE — Patient Instructions
Telephone visit     Subjective:   CC:  Back Pain    HPI:  Dustin Watts is a 63 y.o. male who presents for the following issue(s).    1. Fall  Timing (onset/duration) - 01/20/2019  Location - fell on his arms, legs, hands, and also his head.  Mostly in the low back.  Severity - pain is improving, initially in bed for 2 weeks with difficulty ambulating, less use of pain meds currently and ambulating more  Context - was pushing a cart at a store and he slipped/tripper on something on the ground  Modifying factors - seen at ER and told everything was fine, has been receiving chiropractor for this injury.  Taking norco at night only, and ibuprofen during the day  Associated signs and symptoms - no weakness/numbness      Review of Systems  No current bleeding, no vomiting, no fever, no cough    Past Medical History  He has a past medical history of Acne, Arthritis, Cerebral palsy (HCC/RAF), Eczema, Hypertension, Impaired glucose tolerance (03/23/2016), Low vitamin D level (05/07/2016), Seizure (HCC/RAF) (1973), and Shortening of arm, congenital.    Medications/Supplements    Current Outpatient Medications:   ???  ASPIRIN LOW DOSE 81 MG EC tablet, TAKE 1 TABLET BY MOUTH EVERY DAY, Disp: 90 tablet, Rfl: 3  ???  atorvastatin 40 mg tablet, Take 1 tablet (40 mg total) by mouth daily. (Patient not taking: Reported on 09/16/2018.), Disp: 90 tablet, Rfl: 3  ???  benzoyl peroxide-erythromycin 5-3% gel, Apply to acne once daily.., Disp: 46.6 g, Rfl: 11  ???  Calcium Carbonate-Vitamin D (OYSTER SHELL CALCIUM + VITAMIN D) 500-200 mg-unit TABS tablet, TAKE 1 TABLET BY MOUTH 3 TIMES A DAY, Disp: , Rfl:   ???  HYDROcodone-acetaminophen 5-325 mg tablet, TAKE 1 TABLET BY MOUTH EVERY 4 TO 6 HOURS, Disp: , Rfl:   ???  ibuprofen 600 mg tablet, TAKE 1 TABLET BY MOUTH EVERY 8 HOURS AS NEEDED FOR PAIN, Disp: , Rfl:   ???  ibuprofen 800 mg tablet, Take 1 tablet (800 mg total) by mouth every twelve (12) hours as needed for Moderate Pain (Pain Scale 4-6)., Disp: 180 tablet, Rfl: 0  ???  phenytoin 100 mg ER capsule, Take 1 capsule (100 mg total) by mouth three (3) times daily., Disp: 270 capsule, Rfl: 3  ???  RETIN-A MICRO PUMP 0.04 % gel, Apply topically at bedtime., Disp: 50 g, Rfl: 11  ???  sulfacetamide-sulfur 9-4.5 % LIQD liquid, Apply to face twice daily., Disp: 454 g, Rfl: 11    Objective:   Physical Exam  Constitutional - alert, no distress noted on the electronic communication with the patient  Psychiatric - normal insight and judgement, normal mental status based on the conversation      Assessment/Plan:       ICD-10-CM    1. Fall on same level from slipping, tripping or stumbling, sequela W01.0XXS    2. Body aches R52          Symptoms improving, continue with chiropractor (order signed for the chiropractor), continue wean from Norco, counseled on prophylaxis for constipation,     The above plan of care, diagnosis, orders, and follow-up were discussed with the patient.  Questions related to this recommended plan of care were answered.    Follow up: prn    Lendon Collar. Vic Blackbird, MD, MPH  02/09/2019 at 10:18 AM  Time of note filed does not necessarily reflect the time of encounter.  Time I began the telephone visit: 10:08 AM   Time I ended the telephone visit: 10:18 AM

## 2019-02-09 NOTE — Progress Notes
Telephone visit     Subjective:   CC:  Back Pain    HPI:  Dustin Watts is a 63 y.o. male who presents for the following issue(s).    1. Fall  Timing (onset/duration) - 01/20/2019  Location - fell on his arms, legs, hands, and also his head.  Mostly in the low back.  Severity - pain is improving, initially in bed for 2 weeks with difficulty ambulating, less use of pain meds currently and ambulating more  Context - was pushing a cart at a store and he slipped/tripper on something on the ground  Modifying factors - seen at ER and told everything was fine, has been receiving chiropractor for this injury.  Taking norco at night only, and ibuprofen during the day  Associated signs and symptoms - no weakness/numbness      Review of Systems  No current bleeding, no vomiting, no fever, no cough    Past Medical History  He has a past medical history of Acne, Arthritis, Cerebral palsy (HCC/RAF), Eczema, Hypertension, Impaired glucose tolerance (03/23/2016), Low vitamin D level (05/07/2016), Seizure (HCC/RAF) (1973), and Shortening of arm, congenital.    Medications/Supplements    Current Outpatient Medications:   ???  ASPIRIN LOW DOSE 81 MG EC tablet, TAKE 1 TABLET BY MOUTH EVERY DAY, Disp: 90 tablet, Rfl: 3  ???  atorvastatin 40 mg tablet, Take 1 tablet (40 mg total) by mouth daily. (Patient not taking: Reported on 09/16/2018.), Disp: 90 tablet, Rfl: 3  ???  benzoyl peroxide-erythromycin 5-3% gel, Apply to acne once daily.., Disp: 46.6 g, Rfl: 11  ???  Calcium Carbonate-Vitamin D (OYSTER SHELL CALCIUM + VITAMIN D) 500-200 mg-unit TABS tablet, TAKE 1 TABLET BY MOUTH 3 TIMES A DAY, Disp: , Rfl:   ???  HYDROcodone-acetaminophen 5-325 mg tablet, TAKE 1 TABLET BY MOUTH EVERY 4 TO 6 HOURS, Disp: , Rfl:   ???  ibuprofen 600 mg tablet, TAKE 1 TABLET BY MOUTH EVERY 8 HOURS AS NEEDED FOR PAIN, Disp: , Rfl:   ???  ibuprofen 800 mg tablet, Take 1 tablet (800 mg total) by mouth every twelve (12) hours as needed for Moderate Pain (Pain Scale 4-6)., Disp: 180 tablet, Rfl: 0  ???  phenytoin 100 mg ER capsule, Take 1 capsule (100 mg total) by mouth three (3) times daily., Disp: 270 capsule, Rfl: 3  ???  RETIN-A MICRO PUMP 0.04 % gel, Apply topically at bedtime., Disp: 50 g, Rfl: 11  ???  sulfacetamide-sulfur 9-4.5 % LIQD liquid, Apply to face twice daily., Disp: 454 g, Rfl: 11    Objective:   Physical Exam  Constitutional - alert, no distress noted on the electronic communication with the patient  Psychiatric - normal insight and judgement, normal mental status based on the conversation      Assessment/Plan:       ICD-10-CM    1. Fall on same level from slipping, tripping or stumbling, sequela W01.0XXS    2. Body aches R52          Symptoms improving, continue with chiropractor (order signed for the chiropractor), continue wean from Norco, counseled on prophylaxis for constipation,     The above plan of care, diagnosis, orders, and follow-up were discussed with the patient.  Questions related to this recommended plan of care were answered.    Follow up: prn    Lendon Collar. Vic Blackbird, MD, MPH  02/09/2019 at 10:19 AM  Time of note filed does not necessarily reflect the time of encounter.  Time I began the telephone visit: 10:08 AM   Time I ended the telephone visit: 10:20 AM

## 2019-02-14 ENCOUNTER — Telehealth: Payer: BLUE CROSS/BLUE SHIELD

## 2019-02-14 NOTE — Telephone Encounter
Received denial letter from patients insurance.  Sulfacetamide-sulfur 8%- 4% suspension.

## 2019-02-23 ENCOUNTER — Telehealth: Payer: BLUE CROSS/BLUE SHIELD

## 2019-02-23 DIAGNOSIS — S0083XD Contusion of other part of head, subsequent encounter: Secondary | ICD-10-CM

## 2019-02-23 NOTE — Telephone Encounter
Hello Dr. Vic Blackbird,    The patient had an appointment with you for today, 4/16, at 3 pm.    Per the patient, he did not receive a call so he is kindly requesting a callback from you at your earliest possible convenience today.    Thank you,    Vernice Jefferson

## 2019-02-24 MED ORDER — OXYCODONE HCL 5 MG PO TABS
5 mg | ORAL_TABLET | Freq: Two times a day (BID) | ORAL | 0 refills | Status: AC | PRN
Start: 2019-02-24 — End: 2019-03-04

## 2019-02-24 NOTE — Telephone Encounter
Good morning,    Request has been faxed over.    Thanks,  Merla Riches.

## 2019-02-24 NOTE — Telephone Encounter
Telephone visit     Subjective:   CC:  Facial Pain    HPI:  TALLON Watts is a 63 y.o. male who presents for the following issue(s).    1. Face pain  Timing (onset/duration) - started after the fall about 4 weeks ago  Location - nose and forehead and back  Severity - variable, and recently very painful up to 9/10  Context - has slight concussion after the fall, patient also reports CT scanning in the ER.  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  Has been seeing a [facility].  Using Tylenol and Aleve prn, at times 2 pills of each twice a day.    Associated signs and symptoms - no epistaxis, no vision change, no dizziness, no seizure activity      Review of Systems  No weakness, no numbness, no vomiting, no cough, no fever    Past Medical History  He has a past medical history of Acne, Arthritis, Cerebral palsy (HCC/RAF), Eczema, Hypertension, Impaired glucose tolerance (03/23/2016), Low vitamin D level (05/07/2016), Seizure (HCC/RAF) (1973), and Shortening of arm, congenital.    Medications/Supplements    Current Outpatient Medications:   ???  ASPIRIN LOW DOSE 81 MG EC tablet, TAKE 1 TABLET BY MOUTH EVERY DAY, Disp: 90 tablet, Rfl: 3  ???  atorvastatin 40 mg tablet, Take 1 tablet (40 mg total) by mouth daily. (Patient not taking: Reported on 09/16/2018.), Disp: 90 tablet, Rfl: 3  ???  benzoyl peroxide-erythromycin 5-3% gel, Apply to acne once daily.., Disp: 46.6 g, Rfl: 11  ???  Calcium Carbonate-Vitamin D (OYSTER SHELL CALCIUM + VITAMIN D) 500-200 mg-unit TABS tablet, TAKE 1 TABLET BY MOUTH 3 TIMES A DAY, Disp: , Rfl:   ???  HYDROcodone-acetaminophen 5-325 mg tablet, TAKE 1 TABLET BY MOUTH EVERY 4 TO 6 HOURS, Disp: , Rfl:   ???  ibuprofen 600 mg tablet, TAKE 1 TABLET BY MOUTH EVERY 8 HOURS AS NEEDED FOR PAIN, Disp: , Rfl:   ???  ibuprofen 800 mg tablet, Take 1 tablet (800 mg total) by mouth every twelve (12) hours as needed for Moderate Pain (Pain Scale 4-6)., Disp: 180 tablet, Rfl: 0  ???  oxyCODONE 5 mg tablet, Take 1 tablet (5 mg total) by mouth two (2) times daily as needed. Max Daily Amount: 10 mg, Disp: 20 tablet, Rfl: 0  ???  phenytoin 100 mg ER capsule, Take 1 capsule (100 mg total) by mouth three (3) times daily., Disp: 270 capsule, Rfl: 3  ???  RETIN-A MICRO PUMP 0.04 % gel, Apply topically at bedtime., Disp: 50 g, Rfl: 11  ???  sulfacetamide-sulfur 9-4.5 % LIQD liquid, Apply to face twice daily., Disp: 454 g, Rfl: 11    Objective:   Physical Exam  Constitutional - alert, no distress noted on the electronic communication with the patient  Neuro - normal speech without signs of confusion, no slurred speech  Psychiatric - normal insight and judgement, normal mental status based on the conversation      Labs  I personally reviewed the following labs  Lab Results   Component Value Date    NA 143 05/27/2017    K 4.9 05/27/2017    CL 103 05/27/2017    CO2 24 05/27/2017    BUN 11 05/27/2017    CREAT 0.62 05/27/2017    GLUCOSE 104 (H) 05/27/2017    CALCIUM 9.1 05/27/2017    ALT 24 05/27/2017  AST 24 05/27/2017    ALKPHOS 111 05/27/2017    BILITOT 0.3 05/27/2017     Lab Results   Component Value Date    WBC 5.08 05/27/2017    HGB 14.3 05/27/2017    HCT 42.8 05/27/2017    MCV 101.2 (H) 05/27/2017    PLT 163 05/27/2017     Lab Results   Component Value Date    HGBA1C 5.8 (H) 08/05/2018       Assessment/Plan:       ICD-10-CM    1. Contusion of face, subsequent encounter S00.83XD oxyCODONE 5 mg tablet         Discussed pain treatment including maximizing OTC treatment options including max dose for naproxen+Tylenol.  He does not want to take narcotics frequently but occasionally the pain is severe.  I advised that a short course of oxycodone should not need refills and counseled on infrequent use.  F/u in 1-2 weeks if no improvement in pain.      The above plan of care, diagnosis, orders, and follow-up were discussed with the patient.  Questions related to this recommended plan of care were answered.    Follow up: prn    Lendon Collar. Vic Blackbird, MD, MPH  02/23/2019 at 8:46 PM  Time of note filed does not necessarily reflect the time of encounter.  Time I began the telephone visit: 8:20 PM  Time I ended the telephone visit: 8:42 PM

## 2019-02-28 ENCOUNTER — Telehealth: Payer: MEDICAID

## 2019-02-28 NOTE — Telephone Encounter
Telephone visit is okay.  Please contact patient to schedule.  Thanks!

## 2019-02-28 NOTE — Telephone Encounter
PDL Call to Practice    Reason for Call: Pt called stating he received a phone call from Viviann in the office regarding his upcoming appt on 03/03/2019. Per pt the vm advised him to do a video appt however pt states he doesn't want to do that and per his prev conversation with Dr. Vic Blackbird he should come into the office for his appt. Pt is confused and requested to speak with someone directly in the office.   MD: Dr. Vic Blackbird    Appointment Related?  [x]  Yes  []  No     If yes;  Date: 03/03/2019  Time: 9:30am    Call warm transferred to PDL: [x]  Yes  []  No    Call Received by Practice Representative: Joni Reining

## 2019-02-28 NOTE — Telephone Encounter
Thank you, Dr. Vic Blackbird.    The appointment has been changed to a telephone visit and the patient has been informed.    Dustin Watts

## 2019-03-03 ENCOUNTER — Telehealth: Payer: Medicaid HMO

## 2019-03-03 DIAGNOSIS — S0083XD Contusion of other part of head, subsequent encounter: Secondary | ICD-10-CM

## 2019-03-03 DIAGNOSIS — M170 Bilateral primary osteoarthritis of knee: Secondary | ICD-10-CM

## 2019-03-03 DIAGNOSIS — R52 Pain, unspecified: Secondary | ICD-10-CM

## 2019-03-03 MED ORDER — OXYCODONE HCL 5 MG PO TABS
5 mg | ORAL_TABLET | Freq: Two times a day (BID) | ORAL | 0 refills | Status: AC | PRN
Start: 2019-03-03 — End: ?

## 2019-03-03 MED ORDER — IBUPROFEN 800 MG PO TABS
800 mg | ORAL_TABLET | Freq: Two times a day (BID) | ORAL | 0 refills | Status: AC | PRN
Start: 2019-03-03 — End: 2019-05-30

## 2019-03-03 NOTE — Telephone Encounter
03/03/19,    Vital Signs:  Blood pressure -151/104  Pulse - 92  Temperature - n/a  Weight - 220lbs  Glucose (if diabetic) - n/a  pO2 - n/a  Last Menstrual Period - n/a    Pt did not have a way of taking his temp. I advised Dustin Watts he needs to be monitoring his B/P daily. [name]

## 2019-03-03 NOTE — Progress Notes
Telephone visit     Subjective:   CC:  Follow-up (91-month follow up. Prefers) and Forms (Requesting ER follow up paperwork, from fall he had recent. )    HPI:  Dustin Watts is a 63 y.o. male who presents for the following issue(s).    1. Body aches  Timing (onset/duration) - in the last month after the fall  Location - back, wrist, foot.  Some on the forehead  Severity - improvement but not resolution  Context - fall 5 weeks ago  Modifying factors - Tylenol hasn't helped much. Taking oxycodone BID      Review of Systems  No vomiting, no diarrhea, no weakness, no numbness, no vision change    Past Medical History  He has a past medical history of Acne, Arthritis, Cerebral palsy (HCC/RAF), Eczema, Hypertension, Impaired glucose tolerance (03/23/2016), Low vitamin D level (05/07/2016), Seizure (HCC/RAF) (1973), and Shortening of arm, congenital.    Medications/Supplements    Current Outpatient Medications:   ???  ASPIRIN LOW DOSE 81 MG EC tablet, TAKE 1 TABLET BY MOUTH EVERY DAY, Disp: 90 tablet, Rfl: 3  ???  benzoyl peroxide-erythromycin 5-3% gel, Apply to acne once daily.., Disp: 46.6 g, Rfl: 11  ???  Calcium Carbonate-Vitamin D (OYSTER SHELL CALCIUM + VITAMIN D) 500-200 mg-unit TABS tablet, TAKE 1 TABLET BY MOUTH 3 TIMES A DAY, Disp: , Rfl:   ???  phenytoin 100 mg ER capsule, Take 1 capsule (100 mg total) by mouth three (3) times daily., Disp: 270 capsule, Rfl: 3  ???  RETIN-A MICRO PUMP 0.04 % gel, Apply topically at bedtime., Disp: 50 g, Rfl: 11  ???  sulfacetamide-sulfur 9-4.5 % LIQD liquid, Apply to face twice daily., Disp: 454 g, Rfl: 11  ???  atorvastatin 40 mg tablet, Take 1 tablet (40 mg total) by mouth daily. (Patient not taking: Reported on 09/16/2018.), Disp: 90 tablet, Rfl: 3  ???  ibuprofen 800 mg tablet, Take 1 tablet (800 mg total) by mouth every twelve (12) hours as needed for Moderate Pain (Pain Scale 4-6)., Disp: 180 tablet, Rfl: 0  ???  oxyCODONE 5 mg tablet, Take 1 tablet (5 mg total) by mouth two (2) times daily as needed. Max Daily Amount: 10 mg, Disp: 20 tablet, Rfl: 0    Objective:   Physical Exam  Constitutional - alert, no distress noted on the electronic communication with the patient  Psychiatric - normal insight and judgement, normal mental status based on the conversation    Assessment/Plan:       ICD-10-CM    1. Contusion of face, subsequent encounter S00.83XD oxyCODONE 5 mg tablet   2. Primary osteoarthritis of both knees M17.0 ibuprofen 800 mg tablet   3. Body aches R52 oxyCODONE 5 mg tablet     ibuprofen 800 mg tablet       Contusion and body aches - f/u with me in the office for further eval next week, for now will refill oxycodone and advised short term use only.        The above plan of care, diagnosis, orders, and follow-up were discussed with the patient.  Questions related to this recommended plan of care were answered.    Follow up: prn    Lendon Collar. Vic Blackbird, MD, MPH  03/03/2019 at 4:46 PM  Time of note filed does not necessarily reflect the time of encounter.  Time I began the telephone visit: 9:49 AM  Time I ended the telephone visit: 10:01 AM

## 2019-03-09 ENCOUNTER — Ambulatory Visit: Payer: BLUE CROSS/BLUE SHIELD

## 2019-03-09 DIAGNOSIS — S93402S Sprain of unspecified ligament of left ankle, sequela: Secondary | ICD-10-CM

## 2019-03-09 DIAGNOSIS — R51 Headache: Secondary | ICD-10-CM

## 2019-03-09 NOTE — Progress Notes
[facility] INTERNAL MEDICINE & PEDIATRICS - Office Note  [facility]  [address]  [place] [address]  Phone: [phone]  FAX: [phone]  [phone]     Subjective:   CC:  Follow-up (Follow up visit, from last visit. )    HPI:  Dustin Watts is a 63 y.o. male who presents for the following issue(s).    1. Ankle pain  Timing (onset/duration) - since the fall in March 2020, mostly hurts at night   Location - L ankle  Severity - improving, ''not so bad''  Context - twisted ankle/foot during fall, xray at the Er was neg for fracture  Modifying factors - taking Motrin nightly.  Taking oxycodone daily  Associated signs and symptoms - some other body aches including the L wrist.     2. Forehead pain  - since fall  - improved but still present  - throbbing  - mild  - taking oxycodone daily and Motrin nightly  - no blurry vision      Review of Systems  No siezure activity, no fever, no cough, no CP, no vomiting, no rash, no bleeding    Past Medical History  The following past medical history was reviewed with the patient.  He has a past medical history of Acne, Arthritis, Cerebral palsy (HCC/RAF), Eczema, Hypertension, Impaired glucose tolerance (03/23/2016), Low vitamin D level (05/07/2016), Seizure (HCC/RAF) (1973), and Shortening of arm, congenital.    Medications/Supplements  The following medication list was reviewed with the patient.    Current Outpatient Medications:   ???  ASPIRIN LOW DOSE 81 MG EC tablet, TAKE 1 TABLET BY MOUTH EVERY DAY, Disp: 90 tablet, Rfl: 3  ???  Calcium Carbonate-Vitamin D (OYSTER SHELL CALCIUM + VITAMIN D) 500-200 mg-unit TABS tablet, TAKE 1 TABLET BY MOUTH 3 TIMES A DAY, Disp: , Rfl:   ???  RETIN-A MICRO PUMP 0.04 % gel, Apply topically at bedtime., Disp: 50 g, Rfl: 11  ???  sulfacetamide-sulfur 9-4.5 % LIQD liquid, Apply to face twice daily., Disp: 454 g, Rfl: 11 ???  atorvastatin 40 mg tablet, Take 1 tablet (40 mg total) by mouth daily. (Patient not taking: Reported on 09/16/2018.), Disp: 90 tablet, Rfl: 3  ???  benzoyl peroxide-erythromycin 5-3% gel, Apply to acne once daily.. (Patient not taking: Reported on 03/09/2019.), Disp: 46.6 g, Rfl: 11  ???  ibuprofen 800 mg tablet, Take 1 tablet (800 mg total) by mouth every twelve (12) hours as needed for Moderate Pain (Pain Scale 4-6). (Patient not taking: Reported on 03/09/2019.), Disp: 180 tablet, Rfl: 0  ???  oxyCODONE 5 mg tablet, Take 1 tablet (5 mg total) by mouth two (2) times daily as needed. Max Daily Amount: 10 mg (Patient not taking: Reported on 03/09/2019.), Disp: 20 tablet, Rfl: 0  ???  phenytoin 100 mg ER capsule, Take 1 capsule (100 mg total) by mouth three (3) times daily., Disp: 270 capsule, Rfl: 3    Objective:   Physical Exam  BP 148/91  ~ Pulse (!) 100  ~ Temp 36.2 ???C (97.1 ???F) (Forehead)  ~ Ht 1.829 m (6')  ~ Wt 98 kg (216 lb)  ~ BMI 29.29 kg/m???    Wt Readings from Last 3 Encounters:   03/09/19 98 kg (216 lb)   03/03/19 99.8 kg (220 lb)   01/06/19 99.3 kg (219 lb)   Constitutional - alert, no distress  Eyes - clear conjunctiva, no discharge, EOMI, no ecchymosis  around the eyes, no tenderness on the face/scalp  Ears, Nose, Mouth and Throat - lips normal, mucosa normal  Neck - supple, midline trachea  Skin - normal texture, no rash, no ecchymosis on the face or the L ankle  MSK - mild tenderness in the proximal L ankle diffusely without edema  Neuro - no gross focal deficits, normal speech  Psychiatric - normal insight and judgement, normal mental status    A trained chaperone was present for this exam/procedure: No  Reason why a trained chaperone was not present: Patient declined      Labs  I personally reviewed the following labs  Lab Results   Component Value Date    NA 143 05/27/2017    K 4.9 05/27/2017    CL 103 05/27/2017    CO2 24 05/27/2017    BUN 11 05/27/2017    CREAT 0.62 05/27/2017 GLUCOSE 104 (H) 05/27/2017    CALCIUM 9.1 05/27/2017    ALT 24 05/27/2017    AST 24 05/27/2017    ALKPHOS 111 05/27/2017    BILITOT 0.3 05/27/2017         Studies            Assessment/Plan:       ICD-10-CM    1. Sprain of left ankle, unspecified ligament, sequela S93.402S DME (Durable Medical Equipment) AMB   2. Face pain R51          1. Ankle sprain - DME for ankle brace, supportive care discussed, infrequent pain meds, f/u if persists.    2. Face pain - still some pain from the contusion, exam reassuring, counseled on weaning off the narcotics, continue with OTC pain meds prn.      The above plan of care, diagnosis, orders, and follow-up were discussed with the patient.  Questions related to this recommended plan of care were answered.    Follow up: No follow-ups on file.    Lendon Collar Vic Blackbird, MD, MPH  03/09/2019 at 6:06 PM  Time of note filed does not necessarily reflect the time of encounter.  Portions of this note may have been created with voice recognition software. Occassional wrong-word or ''sound-alike'' substitutions may have occurred due to the inherent limitations of voice recognition software. Please read the chart carefully and recognize, using context, where these substitutions have occurred.

## 2019-03-15 NOTE — Progress Notes
RHEUMATOLOGY OUTPATIENT CONSULTATION  PATIENT: Dustin Watts MRN: [id number] DOB: Jun 09, 1956  REASONS FOR VISIT/CHIEF COMPLAINT:OA      HPI:62 y.o. male former pt of Dr Roselee Nova with generalized OA, OK knees here to establish care.  Hx of epilepsy, pre-diabetic, HTN, HLD, vit D deficiency      Current visit patient reports the followings:-  - L knee doing OK; last injection pain after the procedure lasted 1.5 month; now fine; walking fine  - L ankle strain earlier this year not recall splint  - Ibuprofen 800 mg prn BID but not need them; taking oxycodone as needed on average once at night for the chronic LBP    09/2018  - overall good no falls; L knee some swelling and worsening pain wondering if take fluid out or if any fluid inside  - ibuprofen as needed 800 mg 2 times a day only prn for years no problem with food no indigestion  - otherwise doing well      11/2017   -last visit increasing knee pain XR R knee good L knee mild OA   - doing well; no seizure any longer  - joints are not bad; no pain with walking; no need to take ibuprofen regularly; takes infrequently- a day before and on day out [place]g [place]; requesting refill; no problem no GERD/taking with food    08/2017  -last visit 02/2017 joints are doing fine on prn NSAID/post CVA  -knees are doing not too badly ''only after long walk'' ''described as blown up'', keen to have refill ibuprofen 800 mg PRN given by Dr Roselee Nova in past ''I only take it if I have a long walk to do so taking infrequently''; no stiffness; at times puffy not today; slight worse lately with pain   -other joints are fine  -chronic R sided atrophy since borne Dx cerebral palsy otherwise in good health  -no seizure for some time now      No known history of malar rash, photosensitivity rash, discoid lesions, oral/nasal ulcers,  pleuritic chest pain, seizures/psychosis, Raynaud's, skin tightness, dry eyes/mouth, hair loss or blood clots. No history of muscle pain or proximal myopathy.  No history for fever, weight loss, loss of appetite, night sweat or lymphadenopathy.  No history of skin psoriasis, eye issues, STDs or IBD.    No recent travelling.  No sick contacts.    Prior medication/Joint injections/Immune suppressant therapy:  None    ROS:  A 14 point ROS was obtained and significant for what is mentioned in HPI and below.    ROS    (Check if Normal, or note + findings):   []    reviewed questionnaire on 03/15/2019    []   Not Obtainable :   [x]    Constit : [-] fever, [-] weight loss    [x]    Skin :[-] vasculitic palpable rash, [-] tender nodules, [-]scaling plaques      [x]    Eyes : [-] photophobia, [-] red eye [-] gritty eyes    [x]    CV :[-] LE swelling, orthopnea, [-] PND,    []    MS :     [x]    ENT: [-] ear /nose swelling, [-] throat pain or post nasal d/c   [x]    GI :[-] constipation [-] diarrhea  [-] melena [-]heartburn,   [x]    Heme/Lymph :[-] easy bruising or bleeding      [x]   Resp : [-] DOE, [-] wheezing [-] pleurisy   [x]   GU:[-]dysruria, [-] gross hematuria   []    Neuro :[-] focal neurological [-] wrist or foot drop     [x]    Imm/All :[-] recurrent infections in past, [-] pollen or seasonal allergy   [x]   Endo :   [x]    Psych: [-] mood, [-] memory     PMHx:  Past Medical History:   Diagnosis Date   ??? Acne    ??? Arthritis    ??? Cerebral palsy (HCC/RAF)    ??? Eczema    ??? Hypertension    ??? Impaired glucose tolerance 03/23/2016    HGBA1C of 5.7 on 03/19/2016    ??? Low vitamin D level 05/07/2016    05/07/2016 - taking antiepiletpic which may exacerbate it, started on ergo 50000, will recheck level in about 2 months    ??? Seizure (HCC/RAF) 1973    chronically on Dilantin, no seizure in 25 years, [facility]rolog[facility] (Dr. Wiliam Ke[facility]   ??? Shortening of arm, congenital     right arm       PSHx:  Past Surgical History:   Procedure Laterality Date   ??? rectal abscess  2010    surgery done at [facility]       Meds: Medications that the patient states to be currently taking   Medication Sig   ??? ASPIRIN LOW DOSE 81 MG EC tablet TAKE 1 TABLET BY MOUTH EVERY DAY   ??? benzoyl peroxide-erythromycin 5-3% gel Apply to acne once daily.Marland Kitchen   ??? Calcium Carbonate-Vitamin D (OYSTER SHELL CALCIUM + VITAMIN D) 500-200 mg-unit TABS tablet TAKE 1 TABLET BY MOUTH 3 TIMES A DAY   ??? ibuprofen 800 mg tablet Take 1 tablet (800 mg total) by mouth every twelve (12) hours as needed for Moderate Pain (Pain Scale 4-6).   ??? oxyCODONE 5 mg tablet Take 1 tablet (5 mg total) by mouth two (2) times daily as needed. Max Daily Amount: 10 mg   ??? phenytoin 100 mg ER capsule Take 1 capsule (100 mg total) by mouth three (3) times daily.   ??? RETIN-A MICRO PUMP 0.04 % gel Apply topically at bedtime.   ??? sulfacetamide-sulfur 9-4.5 % LIQD liquid Apply to face twice daily.       Allergies:  Allergies   Allergen Reactions   ??? Sulfa Antibiotics Angioedema     No anaphylaxis   ??? Amlodipine Other (See Comments)     Lip swelling without anaphylaxis   ??? Carvedilol Angioedema     Lip swelling without anaphylaxis   ??? Losartan Rash     No hive, no anaphylaxis, unclear if truly has rash from this medicine   ??? Penicillins Rash     ''bumps on body''       SocHx:  Social History     Tobacco Use   ??? Smoking status: Never Smoker   ??? Smokeless tobacco: Never Used   Substance Use Topics   ??? Alcohol use: No     Works in Loss adjuster, chartered; single    FamHx:  Family History   Problem Relation Age of Onset   ??? Heart disease Mother    ??? Diabetes Mother    ??? Kidney disease Mother    ??? Stroke Brother    ??? Colon cancer Neg Hx    ??? Prostate cancer Neg Hx    ??? Ovarian cancer Neg Hx    ??? Breast cancer Neg Hx    ??? Hyperlipidemia Neg Hx    ???  Hypertension Neg Hx        Physical Exam:      BP 144/89  ~ Pulse 77  ~ Temp 36.6 ???C (97.8 ???F) (Forehead)  ~ Resp 17  ~ Ht 6' (1.829 m) Comment: pt. self reported height ~ Wt 216 lb (98 kg)  ~ SpO2 97% Comment: Room air ~ BMI 29.29 kg/m??? Pain Information (Last Filed)     Score Location Comments Edu?    0-No pain None None None          BP 143/85  ~ Pulse 78  ~ Temp 36.6 ???C (97.8 ???F) (Oral)  ~ Resp 18  ~ Ht 6' (1.829 m) Comment: Pt. self reported height ~ Wt 211 lb (95.7 kg)  ~ SpO2 97% Comment: Room air ~ BMI 28.62 kg/m???   Pain Information     No pain information on file            BP 134/83  ~ Pulse 70  ~ Temp 36.4 ???C (97.5 ???F) (Oral)  ~ Resp 18  ~ Ht 6' (1.829 m)  ~ Wt 209 lb (94.8 kg)  ~ SpO2 97%  ~ BMI 28.35 kg/m???      BP 128/72  ~ Pulse 70  ~ Temp 36.4 ???C (97.5 ???F) (Oral)  ~ Resp 16  ~ Ht 6' (1.829 m)  ~ Wt 205 lb (93 kg)  ~ SpO2 97%  ~ BMI 27.80 kg/m???     Wt Readings from Last 3 Encounters:   03/09/19 216 lb (98 kg)   03/03/19 220 lb (99.8 kg)   01/06/19 219 lb (99.3 kg)     BP Readings from Last 3 Encounters:   03/09/19 148/91   01/06/19 145/90   09/29/18 160/102       NAD  No ocular injection  No alopecia  OP clear  No LAD  CTAB  RRR  S/NT/ND  No C/C/E  No rashes  No telangectasia/discoloration/calcinosis/sclerodactyly  No tender joints  No swollen joints  L knee crepitus+ no effusion  No enthesitis  No dactylitis  No myofascial pain  Neuro grossly intact except R extremities atrophy  Peripheral pulses intact      RIGHT         LEFT   TMJ [] []   ~ [] []  TMJ   Shoulder [] []  ~ [] []  Shoulder                Left box: Tender   Elbow [] []  ~ [] []  Elbow               Right box: Swollen   Wrist [] []  ~ [] []  Wrist   CMC1 [] []  ~ [] []  CMC1       MCP5 MCP4 MCP3 MCP2 MCP1         ~ MCP1 MCP2 MCP3 MCP4 MCP5       [] []  [] []  [] []  [] []  [] []   [] []  [] []  [] []  [] []  [] []        PIP5 PIP4 PIP3 PIP2 IP1 ~ IP1 PIP2 PIP3 PIP4 PIP5       [] []  [] []  [] []  [] []  [] []   [] []  [] []  [] []  [] []  [] []        DIP5 DIP4 DIP3 DIP2  ~  DIP2 DIP3 DIP4 DIP5       [] []  [] []  [] []  [] []     [] []  [] []  [] []  [] []       Sacroiliac []  ~             []  Sacroiliac  Hip  [] []  ~        [] []  Hip                  Knee  [] []  ~        [] []  Knee        Ankle    [] []  ~        [] []  Ankle MTP5 MTP4 MTP3 MTP2 MTP1 ~ MTP1 MTP2 MTP3 MTP4 MCP5       [] []  [] []  [] []  [] []  [] []   [] []  [] []  [] []  [] []  [] []        Toe5 Toe4 Toe3 Toe2 Toe1 ~ Toe1 Toe2 Toe3 Toe4 Toe5        [] []  [] []  [] []  [] []  [] []   [] []  [] []  [] []  [] []  [] []                      Labs:  Lab Results   Component Value Date    WBC 5.08 05/27/2017    HGB 14.3 05/27/2017    HCT 42.8 05/27/2017    PLT 163 05/27/2017    NEUTABS 4.0 11/28/2014    LYMPHABS 1.7 11/28/2014     Lab Results   Component Value Date    GLUCOSE 104 (H) 05/27/2017    GFREST >89 04/18/2014    CREAT 0.62 05/27/2017    CALCIUM 9.1 05/27/2017    TOTPRO 7.6 05/27/2017    ALBUMIN 4.7 05/27/2017    AST 24 05/27/2017    ALT 24 05/27/2017    ALKPHOS 111 05/27/2017     @RRCOAGS @  Lab Results   Component Value Date    TSH 1.5 01/21/2017       Rheum labs:  Lab Results   Component Value Date    SRWEST 13 (H) 05/27/2017    SRWEST 15 (H) 11/28/2014     No results found for: CARDLIPIGA, CARDLIPIGG, CARDLIPIGM, ANAAB, ANAABTITER, B2GLYPROIGA, B2GLYPROIGG, B2GLYPROIGM, DRVVT, RNPAB, SMAB, SSA  No results found for: C3, C4, DSDNAAB, NDNAABIFA  No results found for: Lyman Bishop, MPOAB  Lab Results   Component Value Date    CKTOT 68 04/23/2008    URICACID 3.6 11/28/2014     Lab Results   Component Value Date    VITD25OH 37 08/05/2018    VITD25OH 45 01/10/2018     UA:   Lab Results   Component Value Date    PROTCLUR Trace 04/23/2008    BLDUR Negative 04/23/2008    LEUKESTUR Negative 04/23/2008    RBCSUR 5 04/23/2008    WBCSUR 3 04/23/2008       Studies: [x] reviewed [] ordered [] independently reviewed.  Component      Latest Ref Rng & Units 09/16/2018   Segmented Neutrophil      No Reference Range % 1   Lymphocyte      No Reference Range % 1   Monocyte      No Reference Range % 98   Total Cells      cells 100   Fluid Appearance       Viscoid . . .   RBC Count,Joint Fl      No Reference Range /cmm <1,000   WBC Count,Joint Fl      No Reference Range /cmm 282   Crystals,Fluid #1 No Crystals Seen No Crystals Seen       Imaging:    Knees XR 08/2017   Mild osteoarthritis of the left knee.  No significant arthritis of the right knee.    Knees 2007 normal  ASSESSMENT AND PLAN  Dustin Watts is a 63 y.o. male     Knees OA stable on prn prn ibuprofen 800 mg as needed; no GERD; pt takes rarely; recent slight worsening with effusion in L knee s/p CSI improved   Hx of epilepsy stable   pre-diabetic, HTN, HLD  Hx vit D deficiency WNL 02/2017  overweight    RECOMMENDATIONS  - ibuprofen 800 mg as PRN for knee pain/sied effects discussed rarely need  - labs NSAID toxicity labs 09/2018 not done yet-to have them done today  - weight reduction  - no need L knee aspiration CSI this visit   - continue to loose weight  - explain in detail and all questions answered          Orders Placed This Encounter   ??? Inject/Drain Knee   ??? CBC & Auto Differential   ??? Comprehensive Metabolic Panel   ??? Crystals,Fluid   ??? Joint Fluid Cell Count & Differential   ??? ibuprofen 800 mg tablet           RTC  3 months or sooner as needed.  CC note to PCP/referred physician        Orders Placed This Encounter   ??? ibuprofen 800 mg tablet           IMMUNIZATION STATUS  Immunization History   Administered Date(s) Administered   ??? Tdap 03/08/2016   ??? influenza vaccine IM quadrivalent (Fluarix Quad) (PF) SYR (34 months of age and older) 08/04/2017, 09/08/2018   ??? influenza vaccine IM quadrivalent (Fluzone Quad) (PF) SYR/SDV (70 months of age and older) 08/06/2016   ??? influenza, unspecified formulation 08/06/2016, 08/04/2017     Thank you for allowing me to participate in this patient's care.  If I can be of further service, please contact my office at  [phone].  [x]  >50%  of this * minute visit was spent in face-to-face evaluation and counseling the patient.  The above plan of care, diagnosis, orders, and follow-up were discussed with the patient. Questions related to this recommended plan of care were answered []  Immunosuppression drugs changed. [x]  Risks/benefits/toxicities of meds discussed.   []  Reviewed and summarized old records. []  Requested outside medical records.  []  Discussed with other providers of complex medical conditions and management plans outlined above    Liana Gerold MD Rheumatology Division, [place] [phone])

## 2019-03-16 ENCOUNTER — Ambulatory Visit: Payer: MEDICAID

## 2019-03-16 DIAGNOSIS — Z79899 Other long term (current) drug therapy: Secondary | ICD-10-CM

## 2019-03-16 DIAGNOSIS — M170 Bilateral primary osteoarthritis of knee: Secondary | ICD-10-CM

## 2019-03-16 NOTE — Patient Instructions
Do labs ordered in November 2019    Come back if L knee bothers again in about 3 months     Osteoarthritis: Information from the [employer] of Rheumatology    Osteoarthritis is a joint disease that most often affects middle-age to elderly people. It is commonly referred to as OA or as ''wear and tear'' of the joints, but we now know that OA is a disease of the entire joint, involving the cartilage, joint lining, ligaments, and bone. Although it is more common in older people, it is not really accurate to say that the joints are just ''wearing out.''  About 27 million Americans are living with OA, the most common form of joint disease. The lifetime risk of developing OA of the knee is about 46%, and the lifetime risk of developing OA of the hip is 25%, according to the [facility], a long-term study from the [place] and sponsored by [employer] for [employer] and Prevention (often called the [employer]) and the [employer].  OA is a top cause of disability in older people. The goal of treatment in OA is to reduce pain and improve function. There is no cure for the disease, but some treatments attempt to slow disease progression.  Fast facts  OA is the most common form of joint disease, and is a leading cause of disability in elderly people.   This arthritis tends to occur in the hand joints, spine, hips, knees, and great toes.   It is characterized by breakdown of the cartilage (the tissue that cushions the ends of the bones between joints), bony changes of the joints, deterioration of tendons and ligaments, and various degrees of inflammation of the synovium (joint lining).   Though some of the joint changes are irreversible, most patients will not need joint replacement surgery.   OA symptoms (what you feel) can vary greatly among patients.   A rheumatologist can detect arthritis and prescribe the proper treatment.    What is osteoarthritis? OA is a frequently slowly progressive joint disease typically seen in middle-aged to elderly people.  The disease occurs when the joint cartilage breaks down often because of mechanical stress or biochemical alterations, causing the bone underneath to fail. OA can occur together with other types of arthritis, such as gout or rheumatoid arthritis.  OA tends to affect commonly used joints such as the hands and spine, and the weight-bearing joints such as the hips and knees.   Symptoms include:   Joint pain and stiffness   Knobby swelling at the joint   Cracking or grinding noise with joint movement   Decreased function of the joint  Who gets osteoarthritis?  OA affects people of all races and both sexes. Most often, it occurs in  patients age 10 and above. However, it can occur sooner if you have  other risk factors (things that raise the risk of getting OA).   Risk factors include:   Older age   Having family members with OA   Obesity   Joint injury or repetitive use (overuse) of joints   Joint deformity such as unequal leg length, bowlegs or knocked knees    How is osteoarthritis diagnosed?  Most often doctors detect OA based on the typical symptoms (described earlier) and on results of the physical exam. In some cases, X-rays or other imaging tests may be useful to tell the extent of disease or to help rule out other joint problems.  How is osteoarthritis treated?  There is no proven treatment yet that can reverse joint damage from OA. The goal of treatment is to reduce pain and improve function of the affected joints. Most often, this is possible with a mixture of physical measures and drug therapy and, sometimes, surgery.    Physical measures ??? Weight loss and exercise are useful in OA. Excess weight puts stress on your knee joints and hips and low back. For every 10 pounds of weight you lose over 10 years, you can reduce the chance of developing knee OA by up to 50%. Exercise can improve your muscle strength, decrease joint pain and stiffness, and lower the chance of disability due to OA.    Also helpful are support (''assistive'') devices, such as braces or a walking cane, that help you do daily activities. Heat or cold therapy can help relieve OA symptoms for a short time.     Certain alternative treatments such as spa (hot tub), massage, acupuncture and chiropractic manipulation can help relieve pain for a short time. They can be costly, though, and require repeated treatments. Also, the long-term benefits of these alternative (sometimes called complementary or integrative) medicine treatments are unproven but are under study.     Drug Therapy ??? Forms of drug therapy include topical, oral (by mouth) and injections (shots). You apply topical drugs directly on the skin over the affected joints. These medicines include capsaicin cream, lidocaine and diclofenac gel. Oral pain relievers such as acetaminophen are common first treatments. So are nonsteroidal anti-inflammatory drugs (often called NSAIDs), which decrease swelling and pain.     In 2010, the government (FDA) approved the use of duloxetine (Cymbalta) for chronic (long-term) musculoskeletal pain including from OA. This oral drug is not new. It also is in use for other health concerns, such as mood disorders, nerve pain and fibromyalgia.     Patients with more serious pain may need stronger medications, such as prescription narcotics.     Joint injections with corticosteroids (sometimes called cortisone shots) or with a form of lubricant called hyaluronic acid can give months of pain relief from OA. This lubricant is given in the knee, and these shots may help delay the need for a knee replacement by a few years in some patients.     Surgery ??? Surgical treatment becomes an option for severe cases. This includes when the joint has serious damage, or when medical treatment fails to relieve pain and you have major loss of function. Surgery may involve arthroscopy, repair of the joint done through small incisions (cuts). If the joint damage cannot be repaired, you may need a joint replacement.    Supplements ??? Many over-the-counter nutrition supplements have been used for treatment of OA. Most lack good research data to support their effectiveness and safety. Among the most widely used are glucosamine/chondroitin sulfate, calcium and vitamin D, and omega-3 fatty acids. To ensure safety and avoid drug interactions, consult your doctor or pharmacist before using any of these supplements. This is especially true when you are combining these supplements with prescribed drugs.  Living with Osteoarthritis  There is no cure for OA, but you can manage how it affects your lifestyle. Some tips include:  Properly position and support your neck and back while sitting or sleeping.   Adjust furniture, such as raising a chair or toilet seat.   Avoid repeated motions of the joint, especially frequent bending.   Lose weight if you are overweight or obese, which can reduce  pain and slow progression of OA.   Exercise each day.   Use arthritis support devices that will help you do daily activities.  You might want to work with a physical therapist or occupational therapist to learn the best exercises and to choose arthritis assistive devices.   Points to remember  OA is the most common form of arthritis and can occur together with other types of arthritis.   The goal of treatment in OA is to reduce pain and improve function.   Exercise is an important part of OA treatment because it can decrease joint pain and improve function.   At present, there is no treatment that can reverse the damage of OA in the joints. Researchers are trying to find ways to slow or reverse this joint damage.   The rheumatologist's role in the treatment of osteoarthritis  Rheumatologists are doctors who are experts in diagnosing and treating arthritis and other diseases of the joints, muscles and bones. You may also need to see other health care providers, for instance, physical or occupational therapists and orthopedic doctors.  For more information  The [employer] of Rheumatology has compiled this list to give you a starting point for your own additional research. The ACR does not endorse or maintain these Web sites, and is not responsible for any information or claims provided on them. It is always best to talk with your rheumatologist for more information and before making any decisions about your care.  Arthritis Foundation  ww.arthritis.[facility] of Arthritis and Musculoskeletal and Skin Diseases   www.niams.http://www.myers.net/  Rheumatology Research Foundation   Learn how the Rheumatology Research Foundation advances research and training to improve the health of people with rheumatic diseases.  www.rheumatology.org/REF  Updated February 2012   Written by Manning Charity, MD, and reviewed by the [facility] of Rheumatology Communications and Marketing Committee.

## 2019-03-20 ENCOUNTER — Telehealth: Payer: MEDICAID

## 2019-03-20 DIAGNOSIS — M25532 Pain in left wrist: Secondary | ICD-10-CM

## 2019-03-20 NOTE — Telephone Encounter
03/20/19,    Dr Vic Blackbird,  Mr Fly called and the splint I gave him did not work for his left hand/wrist. He was wondering if you could send over a new Rx to this fax number: [phone]. The Pt is going to another appointment and he asked if they had splints, they said they have left hand splints all they needed was an prescription faxed to the office. Mr Haw would like a call tomorrow AM once you fax over this Rx. His number is [phone]. Thank you, [name]

## 2019-03-20 NOTE — Telephone Encounter
PDL Call to Practice    Reason for Call: Pt wanting to speak to [facility] regarding a splint  MD: Dr. Vic Blackbird    Appointment Related?  []  Yes  [x]  No     If yes;  Date:  Time:    Call warm transferred to PDL: [x]  Yes  []  No    Call Received by Practice Representative: [name]

## 2019-03-24 NOTE — Telephone Encounter
Please advise patient that it is okay to wear the nighttime splint as he specified.  Please let me know if he needs a new order for a splint.

## 2019-03-24 NOTE — Telephone Encounter
03/24/19,  Called and left a voicemail for Dustin Watts letting him know its OK to wear the nighttime splint during the day. [name]

## 2019-04-04 ENCOUNTER — Telehealth: Payer: Medicaid HMO

## 2019-04-04 NOTE — Telephone Encounter
PDL Call to Practice    Reason for Call: Pt wants to speak with someone in the authorization dept about possible new insurance  MD: Dr. Otelia Limes    Appointment Related?  []  Yes  [x]  No     If yes;  Date:  Time:    Call warm transferred to PDL: [x]  Yes  []  No    Call Received by Practice Representative: Arline Asp

## 2019-04-04 NOTE — Telephone Encounter
Patient wanted advised if he switch his current plan to [employer] if his Retani-A cream would be covered. Explain to patient that I wouldn't know because different insurance plan cover different medications. Advised patient to call his pharmacy or insurance to receive accurate information.    Jeanene Erb, MA

## 2019-04-06 ENCOUNTER — Ambulatory Visit: Payer: MEDICAID

## 2019-04-06 DIAGNOSIS — S96912D Strain of unspecified muscle and tendon at ankle and foot level, left foot, subsequent encounter: Secondary | ICD-10-CM

## 2019-04-06 DIAGNOSIS — Z638 Other specified problems related to primary support group: Secondary | ICD-10-CM

## 2019-04-06 DIAGNOSIS — G40909 Epilepsy, unspecified, not intractable, without status epilepticus: Secondary | ICD-10-CM

## 2019-04-06 NOTE — Patient Instructions
To learn more about the insurance accepted at [facility] you can go here: [email or web address]  You can also call [employer] about the [facility] insurance at [facility]:  [email or web address]  and [phone]

## 2019-04-06 NOTE — Progress Notes
[facility] INTERNAL MEDICINE & PEDIATRICS - Office Note  [place]  [address]  [place] [address]  Phone: [phone]  FAX: [phone]  [phone]     Subjective:   CC:  Follow-up (Follow on medication. )    HPI:  Dustin Watts is a 63 y.o. male who presents for the following issue(s).    1. Ankle pain  Timing (onset/duration) - since the fall in March 2020, worse at night   Location - L ankle, diffusely  Severity - has been mild and still improving  Context - initially twisted ankle/foot during fall, xray at the ER was neg for fracture  Modifying factors - was taking Motrin nightly and was using oxycodone daily but now off the narcotic completed, taking Aleve 1-2 times a week, and occasional aspirin  Associated signs and symptoms - has chronic knee pain (has been seeing Rheumatology)    2. Siezure history  - chronic, since childhood  - no seizure in >30 years  - seeing neurologist about twice a year, at [facility]  - may need to change insurance and may need to change Neurology here    3. Stress  - last few months  - financial conflict with siblings  - overall ''I'm a happy person''  - denies significant anxiety or depression  - minimizing interaction with his siblings      Review of Systems  As above in the HPI and also a 14 point review of systems was reviewed with the patient (see document filled out by the patient today scanned into [employer]).    Past Medical History  The following past medical history was reviewed with the patient.  He has a past medical history of Acne, Arthritis, Cerebral palsy (HCC/RAF), Eczema, Hypertension, Impaired glucose tolerance (03/23/2016), Low vitamin D level (05/07/2016), Seizure (HCC/RAF) (1973), and Shortening of arm, congenital.    Medications/Supplements  The following medication list was reviewed with the patient.    Current Outpatient Medications: ???  ASPIRIN LOW DOSE 81 MG EC tablet, TAKE 1 TABLET BY MOUTH EVERY DAY, Disp: 90 tablet, Rfl: 3  ???  benzoyl peroxide-erythromycin 5-3% gel, Apply to acne once daily.., Disp: 46.6 g, Rfl: 11  ???  Calcium Carbonate-Vitamin D (OYSTER SHELL CALCIUM + VITAMIN D) 500-200 mg-unit TABS tablet, TAKE 1 TABLET BY MOUTH 3 TIMES A DAY, Disp: , Rfl:   ???  ibuprofen 800 mg tablet, Take 1 tablet (800 mg total) by mouth every twelve (12) hours as needed for Moderate Pain (Pain Scale 4-6)., Disp: 180 tablet, Rfl: 0  ???  oxyCODONE 5 mg tablet, Take 1 tablet (5 mg total) by mouth two (2) times daily as needed. Max Daily Amount: 10 mg, Disp: 20 tablet, Rfl: 0  ???  phenytoin 100 mg ER capsule, Take 1 capsule (100 mg total) by mouth three (3) times daily., Disp: 270 capsule, Rfl: 3  ???  RETIN-A MICRO PUMP 0.04 % gel, Apply topically at bedtime., Disp: 50 g, Rfl: 11  ???  sulfacetamide-sulfur 9-4.5 % LIQD liquid, Apply to face twice daily., Disp: 454 g, Rfl: 11  ???  atorvastatin 40 mg tablet, Take 1 tablet (40 mg total) by mouth daily. (Patient not taking: Reported on 03/16/2019.), Disp: 90 tablet, Rfl: 3    Objective:   Physical Exam  BP 150/82  ~ Pulse (!) 100  ~ Temp 36.6 ???C (97.8 ???F) (Forehead)  ~ Ht 1.829 m (6')  ~ [employer]  96.6 kg (213 lb)  ~ SpO2 98%  ~ BMI 28.89 kg/m???   Constitutional - alert, no distress  Eyes - clear conjunctiva, no discharge  Neck - supple, midline trachea  MSK - brace on L wrist and L ankle, with mild tendeness on deep palpation of the L ankle without edema  Skin - normal texture, no rash  Neuro - no gross focal deficits, normal speech  Psychiatric - normal insight and judgement, normal mental status    A trained chaperone was present for this exam/procedure: No  Reason why a trained chaperone was not present: Patient declined      Labs  I personally reviewed the following labs  Lab Results   Component Value Date    NA 142 03/16/2019    K 4.0 03/16/2019    CL 103 03/16/2019    CO2 24 03/16/2019    BUN 11 03/16/2019 CREAT 0.58 (L) 03/16/2019    GLUCOSE 98 03/16/2019    CALCIUM 8.6 03/16/2019    ALT 18 03/16/2019    AST 21 03/16/2019    ALKPHOS 99 03/16/2019    BILITOT 0.2 03/16/2019     Lab Results   Component Value Date    HGBA1C 5.8 (H) 08/05/2018     Lab Results   Component Value Date    WBC 4.55 03/16/2019    HGB 13.4 (L) 03/16/2019    HCT 40.2 03/16/2019    MCV 99.3 (H) 03/16/2019    PLT 194 03/16/2019     Lab Results   Component Value Date    CHOL 257 08/05/2018    CHOLHDL 54 08/05/2018    CHOLDLCAL 166 (H) 08/05/2018    TRIGLY 187 (H) 08/05/2018       Assessment/Plan:       ICD-10-CM    1. Strain of left ankle, subsequent encounter S96.912D    2. Nonintractable epilepsy without status epilepticus, unspecified epilepsy type (HCC/RAF) G40.909 Referral to Neurology   3. Stress due to family tension Z63.8        Ankle strain - mostly resolved, counseled on appropriate long term use of OTC pain meds, no more narcotics needed, continue with bracing for now, f/u prn    Epilepsy - referred to neurology for management here as changing insurance.    Problem   Stress Due to Family Tension    04/06/2019 - overall mild, financial conflict with siblings, counseled on behavioral management, and f/u if symptoms persist (currently only with mild anxiety)         The above plan of care, diagnosis, orders, and follow-up were discussed with the patient.  Questions related to this recommended plan of care were answered.    Follow up: Return if symptoms worsen or fail to improve.    Lendon Collar Vic Blackbird, MD, MPH  04/06/2019 at 8:45 PM  Time of note filed does not necessarily reflect the time of encounter.  Portions of this note may have been created with voice recognition software. Occassional wrong-word or ''sound-alike'' substitutions may have occurred due to the inherent limitations of voice recognition software. Please read the chart carefully and recognize, using context, where these substitutions have occurred.

## 2019-04-13 ENCOUNTER — Telehealth: Payer: BLUE CROSS/BLUE SHIELD

## 2019-04-13 NOTE — Telephone Encounter
Call Back Request    MD: Dr. Vic Blackbird     Reason for call back: Pt would like to speak with [facility]. Pt states she took his wrap off of his ankle and it is a little swollen. Pt denied NT.    Any Symptoms:  [x]  Yes  []  No      ? If yes, what symptoms are you experiencing: Swelling on ankle   o Duration of symptoms (how long):  Current   o Have you taken medication for symptoms (OTC or Rx):      Patient or caller has been notified of the 24-48 hour turnaround time.

## 2019-04-13 NOTE — Telephone Encounter
Forwarded by: Analysa Nutting CHONG SOOK SHON

## 2019-04-13 NOTE — Telephone Encounter
04/13/19,  Dr Vic Blackbird,  I talked to Dustin Watts and he is still having lots swelling with his ankle. We switched his appointment from 6/12 to tomorrow 6/5 so you can take a look at it. [name]

## 2019-04-14 ENCOUNTER — Ambulatory Visit: Payer: MEDICARE

## 2019-04-14 DIAGNOSIS — M25472 Effusion, left ankle: Secondary | ICD-10-CM

## 2019-04-14 DIAGNOSIS — D649 Anemia, unspecified: Secondary | ICD-10-CM

## 2019-04-14 LAB — Lipid Panel: CHOLESTEROL, HDL: 46 mg/dL (ref >40)

## 2019-04-14 LAB — Hgb A1c: HGB A1C - HPLC: 5.8 — ABNORMAL HIGH (ref <5.7)

## 2019-04-14 NOTE — Progress Notes
[facility] INTERNAL MEDICINE & PEDIATRICS - Office Note  [facility]  [address]  [place] [address]  Phone: [phone]  FAX: [phone]  [phone]     Subjective:   CC:  Mass (left ankle) and Ankle Pain (left side)    HPI:  Dustin Watts is a 63 y.o. male who presents for the following issue(s).    1. Ankle pain  Timing (onset/duration) - a few months since his fall  Location - anterior L ankle  Severity - mild  Context - no recent trauma.  A friend told him that he may have a clot  Modifying factors - the swelling has been worse then he was wearing the ankle brace  Associated signs and symptoms - swelling around the right ankle    2. Stress/anxiety  - worsening the last few weeks  - secondary to interpersonal conflicts at the office  - currently much improved  - not taking any medicines for this symptom  - exercising daily  - has friends and family he can talk with    PHQ-9 total score:   PH-9 TOTAL SCORE [place] 04/14/2019   Total Score 0        GAD-7 total score:   GAD-7 TOTAL SCORE [place] 04/14/2019   Total Score 0        DAST total score:  DAST TOTAL SCORE [place] 04/14/2019   In the last year, have you used an illegal drug or prescription medication for non-medical reasons? No   Total Score 0        Audit-C total score:   AUDIT-T TOTAL SCORE [place] 04/14/2019   Total Score 0                Review of Systems  As above in the HPI and also a 14 point review of systems was reviewed with the patient (see document filled out by the patient today scanned into [facility]).    Past Medical History  The following past medical history was reviewed with the patient.  He has a past medical history of Acne, Arthritis, Cerebral palsy (HCC/RAF), Eczema, Hypertension, Impaired glucose tolerance (03/23/2016), Low vitamin D level (05/07/2016), Overweight, Seizure (HCC/RAF) (1973), and Shortening of arm, congenital.    Medications/Supplements The following medication list was reviewed with the patient.    Current Outpatient Medications:   ???  ASPIRIN LOW DOSE 81 MG EC tablet, TAKE 1 TABLET BY MOUTH EVERY DAY, Disp: 90 tablet, Rfl: 3  ???  benzoyl peroxide-erythromycin 5-3% gel, Apply to acne once daily.., Disp: 46.6 g, Rfl: 11  ???  Calcium Carbonate-Vitamin D (OYSTER SHELL CALCIUM + VITAMIN D) 500-200 mg-unit TABS tablet, TAKE 1 TABLET BY MOUTH 3 TIMES A DAY, Disp: , Rfl:   ???  ibuprofen 800 mg tablet, Take 1 tablet (800 mg total) by mouth every twelve (12) hours as needed for Moderate Pain (Pain Scale 4-6)., Disp: 180 tablet, Rfl: 0  ???  oxyCODONE 5 mg tablet, Take 1 tablet (5 mg total) by mouth two (2) times daily as needed. Max Daily Amount: 10 mg, Disp: 20 tablet, Rfl: 0  ???  phenytoin 100 mg ER capsule, Take 1 capsule (100 mg total) by mouth three (3) times daily., Disp: 270 capsule, Rfl: 3  ???  RETIN-A MICRO PUMP 0.04 % gel, Apply topically at bedtime., Disp: 50 g, Rfl: 11  ???  sulfacetamide-sulfur 9-4.5 % LIQD liquid, Apply to face twice daily., Disp: 454  g, Rfl: 11  ???  atorvastatin 40 mg tablet, Take 1 tablet (40 mg total) by mouth daily. (Patient not taking: Reported on 03/16/2019.), Disp: 90 tablet, Rfl: 3    Objective:   Physical Exam  BP 155/90  ~ Pulse 89  ~ Temp 36.2 ???C (97.1 ???F) (Temporal)  ~ Resp 18  ~ Ht 6' (1.829 m) Comment: per pt ~ Wt 216 lb (98 kg)  ~ SpO2 98%  ~ BMI 29.29 kg/m???    Wt Readings from Last 3 Encounters:   04/14/19 216 lb (98 kg)   04/06/19 213 lb (96.6 kg)   03/16/19 216 lb (98 kg)   Constitutional - alert, no distress  Eyes - clear conjunctiva, no discharge  Neck - supple, midline trachea  Skin - normal texture, no rash including no rash on the ankle  MSK - 1+ pitting edema around the L ankle with circumference of 27.5cm without unusual warmth or erythema.  Nontender throughout.  FROM of the ankle.  [place] neg.  Neuro - no gross focal deficits, normal speech    A trained chaperone was present for this exam/procedure: No Reason why a trained chaperone was not present: Patient declined      Labs  I personally reviewed the following labs  Lab Results   Component Value Date    NA 142 03/16/2019    K 4.0 03/16/2019    CL 103 03/16/2019    CO2 24 03/16/2019    BUN 11 03/16/2019    CREAT 0.58 (L) 03/16/2019    GLUCOSE 98 03/16/2019    CALCIUM 8.6 03/16/2019    ALT 18 03/16/2019    AST 21 03/16/2019    ALKPHOS 99 03/16/2019    BILITOT 0.2 03/16/2019     Lab Results   Component Value Date    HGBA1C 5.8 (H) 04/14/2019     Lab Results   Component Value Date    WBC 4.55 03/16/2019    HGB 13.4 (L) 03/16/2019    HCT 40.2 03/16/2019    MCV 99.3 (H) 03/16/2019    PLT 194 03/16/2019     Lab Results   Component Value Date    VITD25OH 37 08/05/2018     Lab Results   Component Value Date    CHOL 208 04/14/2019    CHOLHDL 46 04/14/2019    CHOLDLCAL 111 (H) 04/14/2019    TRIGLY 257 (H) 04/14/2019           Assessment/Plan:       ICD-10-CM    1. Edema of left ankle M25.472 US duplex lower extremity veins left   2. Prediabetes R73.02 Hgb A1c   3. Mild anemia D64.9    4. Elevated cholesterol with elevated triglycerides E78.2 Lipid Panel   5. Stress due to family tension Z63.8            Ankle edema - mild, also likely to be a component of subcutaneous fat, unlikely to be DVT but patient very concerned, will get [place] to eval for DVT although low pretest probability, will continue with ankle brace, elevation, and f/u if worsening.      Problem   Mild Anemia    04/14/2019 - review of CBC in the last decade shows occasional mildly low hgb, and MCV is usually normal, likely not a pathology and normal variation for the patient, okay to continue the MVI with iron, will monitor every year or so.     Stress Due to Family Tension  04/06/2019 - overall mild, financial conflict with siblings, counseled on behavioral management, and f/u if symptoms persist (currently only with mild anxiety)  04/14/2019 - recently improving, low GAD7 and PHQ9 scores, counseled on f/u should any symptoms return     Elevated Cholesterol With Elevated Triglycerides    01/21/2017 - elevated lipids again 2 months ago (ASCVD score about 10%) but patient declines new   06/03/2017 - ASCVD score >7.5, would benefit from statin but patient declines, discussed benefits/risks again including the risk of heart disease and death, and patient prefers to continue with exercise and diet changes.    12/02/2017- ASCVD score still elevated, discussed risks again as shown above, patient declines again, will monitor every 6 months or so.  Counseled on diet/exercise again.  08/05/2018 - ASCVD 13%, again counseled on statin.  Will prescribe Lipitor 40 and pt to consider  09/29/2018 - chronic, stable, declines to continue Lipitor (headaches, bloating), will proceed with diet/exercise, and monitor   04/14/2019 - recheck today, counseled on exercise     Prediabetes    Chronic, relatively stable  04/14/2019- recheck today, counseled on exercise including continue biking       PHQ9 and GAD7 results shown in this note were reviewed and discussed with patient today.      The above plan of care, diagnosis, orders, and follow-up were discussed with the patient.  Questions related to this recommended plan of care were answered.    Follow up: Return if symptoms worsen or fail to improve.    Lendon Collar Vic Blackbird, MD, MPH  04/14/2019 at 4:29 PM  Time of note filed does not necessarily reflect the time of encounter.  The documentation and medical complexity of this visit are consistent with a 99214 as shown by the following:  CHIEF COMPLAINT: documented  HPI: Four elements and/or status of three chronic conditions documented  PAST HISTORY: At least one item (medical, family or social) documented  REVIEW OF SYSTEMS: At least two systems documented  EXAM: at least 12 bullets from any organ systems  MEDICAL DECISION MAKING: medium complexity as shown by the following:  Problems (3 points total) [x ] New problem, with no additional work-up planned - 3 points - mild anemia  [x ] New problem, with additional work-up planned - 4 points - ankle swelling  Risk level of Moderate Risk (any one of the following)  [x ] Two or more stable chronic illnesses - prediabetes, high cholesterol

## 2019-04-14 NOTE — Assessment & Plan Note
THIS REPORT IS AUTOMATICALLY GENERATED BY THE [facility].     ADULT ASSESSMENT FEEDBACK REPORT    -------------------------    DEMOGRAPHICS    *Last 5 MRN# [id number]  *Gender: M  *Age: 63    -------------------------    Generalized Anxiety Disorder 7-item (GAD-7)  Score: GREEN (0) Individual is not currently experiencing anxiety. Highlight positive coping and encourage continued use of resiliency skills.    Patient Health Questionnaire 9-item (PHQ-9)  Score: GREEN (0) Individual does not exhibit emotional or functional problems indicative of depression. Current use of resilient behaviors should be identified and supported.    Alcohol Use Disorders Identification Test (AUDIT-C)  Score: None.    GAD-7 Items  *only listed items scored 1 or higher (1 - Several days, 2 - More than half the days and 3 - Nearly every day)    PHQ-9 Items  *only listed items scored 1 or higher (1 - Several days, 2 - More than half the days and 3 - Nearly every day)    AUDIT-C Items  1. How often do you have a drink containing alcohol? -- Never (0)    Total Scores (Text Only):  GAD-7 = 0  PHQ-9 = 0  AUDIT-C = 0    --- END ---    [facility] Check-Up Report generated on 2019-04-14

## 2019-04-17 ENCOUNTER — Telehealth: Payer: MEDICAID

## 2019-04-17 NOTE — Telephone Encounter
lvm to schedule new pt appt with general or rcc; video appt next available

## 2019-04-21 ENCOUNTER — Ambulatory Visit: Payer: MEDICAID

## 2019-04-26 ENCOUNTER — Ambulatory Visit: Payer: BLUE CROSS/BLUE SHIELD

## 2019-04-27 ENCOUNTER — Ambulatory Visit: Payer: BLUE CROSS/BLUE SHIELD

## 2019-04-28 ENCOUNTER — Inpatient Hospital Stay: Payer: MEDICARE

## 2019-04-28 ENCOUNTER — Telehealth: Payer: MEDICAID

## 2019-04-28 DIAGNOSIS — M25472 Effusion, left ankle: Secondary | ICD-10-CM

## 2019-04-28 NOTE — Telephone Encounter
-----   Message from Ursa. Elwanda Brooklyn, MD, MPH sent at 04/28/2019 11:44 AM PDT -----  Please inform pt the ultrasound of the leg was negative for a clot.  I'm happy to chat with him on the phone if he has any questions.  [phone].    Thanks!

## 2019-04-28 NOTE — Telephone Encounter
Left Pt VM relaying Dr. Elwanda Brooklyn message.

## 2019-05-28 MED ORDER — IBUPROFEN 800 MG PO TABS
ORAL_TABLET | 0 refills | 15.00000 days
Start: 2019-05-28 — End: ?

## 2019-05-29 MED ORDER — IBUPROFEN 800 MG PO TABS
ORAL_TABLET | 1 refills | 15.00000 days | Status: AC
Start: 2019-05-29 — End: ?

## 2019-05-30 ENCOUNTER — Telehealth: Payer: BLUE CROSS/BLUE SHIELD

## 2019-05-30 NOTE — Telephone Encounter
Call Back Request    MD: Dr. Lavada Mesi      Reason for call back: Patient would like to speak with someone in Dr. Andrey Farmer office regarding his RETIN-A MICRO PUMP 0.04 % gel prescription. He states the pharmacy does not currently have it in stock and would have to order it but they do have the 0.06% in stock. He would like to discuss getting the prescription changed.    Any Symptoms:  []  Yes  [x]  No      ? If yes, what symptoms are you experiencing:    o Duration of symptoms (how long):    o Have you taken medication for symptoms (OTC or Rx):      Patient or caller has been notified of the 24-48 hour turnaround time.

## 2019-05-30 NOTE — Telephone Encounter
Forwarded by: Analleli Gierke ALBERTO Jaquana Geiger    Please advise on message below.

## 2019-05-31 MED ORDER — TRETINOIN MICROSPHERE 0.06 % EX GEL
TOPICAL | 6 refills | 30.00000 days | Status: AC
Start: 2019-05-31 — End: 2019-06-08

## 2019-05-31 NOTE — Telephone Encounter
LVM for patient relaying Dr. Goh's advise.    Marzell Isakson, MA

## 2019-05-31 NOTE — Telephone Encounter
Prescription for retin-a micro 0.06% gel pump sent to pharmacy per patient request.

## 2019-06-07 MED ORDER — TRETINOIN MICROSPHERE 0.06 % EX GEL
6 refills | Status: AC
Start: 2019-06-07 — End: ?

## 2019-06-07 NOTE — Telephone Encounter
Done. Thanks.

## 2019-06-07 NOTE — Telephone Encounter
Message to Practice/Provider    MD: Lavada Mesi    Message: Pt would like to ask MD if she could send RX of RETIN-A 0.06 % to [facility] in [place], [place]. [phone]     RX was sent to wrong pharmacy previously          Pt would like a call back to confirm RX was sent.    Return call is not being requested by the patient or caller.    Patient or caller has been notified of the 24-48 hour processing turnaround time if applicable.

## 2019-06-08 ENCOUNTER — Ambulatory Visit: Payer: MEDICARE

## 2019-06-08 DIAGNOSIS — R600 Localized edema: Secondary | ICD-10-CM

## 2019-06-08 DIAGNOSIS — K429 Umbilical hernia without obstruction or gangrene: Secondary | ICD-10-CM

## 2019-06-08 NOTE — Progress Notes
[facility] INTERNAL MEDICINE & PEDIATRICS - Office Note  [facility]  [address]  [place] [address]  Phone: [phone]  FAX: [phone]  [phone]     Subjective:   CC:  Health Maintenance    HPI:  Dustin Watts is a 63 y.o. male who presents for the following issue(s).    1. Leg edema  Timing (onset/duration) - last few months  Location - LLE  Severity - mild, not worsening  Context - fall a few months ago around the time this started.  Doppler US neg  Modifying factors - sometimes elevates it  Associated signs and symptoms - no SOB, no cough      Review of Systems  No fever, no vomiting, no diarrhea    Past Medical History  The following past medical history was reviewed with the patient.  He has a past medical history of Acne, Arthritis, Cerebral palsy (HCC/RAF), Eczema, Hypertension, Impaired glucose tolerance (03/23/2016), Low vitamin D level (05/07/2016), Overweight, Seizure (HCC/RAF) (1973), and Shortening of arm, congenital.    Medications/Supplements  The following medication list was reviewed with the patient.    Current Outpatient Medications:   ???  ASPIRIN LOW DOSE 81 MG EC tablet, TAKE 1 TABLET BY MOUTH EVERY DAY, Disp: 90 tablet, Rfl: 3  ???  benzoyl peroxide-erythromycin 5-3% gel, Apply to acne once daily.., Disp: 46.6 g, Rfl: 11  ???  Calcium Carbonate-Vitamin D (OYSTER SHELL CALCIUM + VITAMIN D) 500-200 mg-unit TABS tablet, TAKE 1 TABLET BY MOUTH 3 TIMES A DAY, Disp: , Rfl:   ???  IBUPROFEN 800 mg tablet, TAKE 1 TABLET BY MOUTH EVERY 12 HOURS AS NEEDED FOR MODERATE PAIN (PAIN SCALE 4-6), Disp: 30 tablet, Rfl: 1  ???  oxyCODONE 5 mg tablet, Take 1 tablet (5 mg total) by mouth two (2) times daily as needed. Max Daily Amount: 10 mg, Disp: 20 tablet, Rfl: 0  ???  RETIN-A MICRO PUMP 0.04 % gel, Apply topically at bedtime., Disp: 50 g, Rfl: 11  ???  sulfacetamide-sulfur 9-4.5 % LIQD liquid, Apply to face twice daily., Disp: 454 g, Rfl: 11 ???  Tretinoin Microsphere (RETIN-A MICRO PUMP) 0.06 % GEL, Apply pea sized amount to face nightly., Disp: 50 g, Rfl: 6  ???  atorvastatin 40 mg tablet, Take 1 tablet (40 mg total) by mouth daily. (Patient not taking: Reported on 03/16/2019.), Disp: 90 tablet, Rfl: 3    Objective:   Physical Exam  BP 153/89  ~ Pulse (!) 105  ~ Temp 36.7 ???C (98 ???F) (Tympanic)  ~ Resp 17  ~ Ht 1.829 m (6' 0.01'')  ~ Wt 98.2 kg (216 lb 6.4 oz)  ~ SpO2 97% Comment: Room air ~ BMI 29.34 kg/m???    Wt Readings from Last 3 Encounters:   06/08/19 98.2 kg (216 lb 6.4 oz)   04/14/19 98 kg (216 lb)   04/06/19 96.6 kg (213 lb)   Constitutional - alert, no distress  Eyes - clear conjunctiva, no discharge  Ears, Nose, Mouth and Throat - lips normal, mucosa normal  Neck - supple, midline trachea  Skin - normal texture, no rash  Abd - reducible nontender umbilical hernia about 2.5cm  MSK - 1+ pitting edema of the L ankle, trace edema of the R ankle  Neuro - no gross focal deficits, normal speech  Psychiatric - normal insight and judgement, normal mental status    A trained chaperone was present for this  exam/procedure: No  Reason why a trained chaperone was not present: Chaperone not required for the exam/procedure      Labs  I personally reviewed the following labs  Lab Results   Component Value Date    ALBCREATUR 8.0 01/21/2017     Lab Results   Component Value Date    NA 142 03/16/2019    K 4.0 03/16/2019    CL 103 03/16/2019    CO2 24 03/16/2019    BUN 11 03/16/2019    CREAT 0.58 (L) 03/16/2019    GLUCOSE 98 03/16/2019    CALCIUM 8.6 03/16/2019    ALT 18 03/16/2019    AST 21 03/16/2019    ALKPHOS 99 03/16/2019    BILITOT 0.2 03/16/2019     Lab Results   Component Value Date    WBC 4.55 03/16/2019    HGB 13.4 (L) 03/16/2019    HCT 40.2 03/16/2019    MCV 99.3 (H) 03/16/2019    PLT 194 03/16/2019     Lab Results   Component Value Date    HGBA1C 5.8 (H) 04/14/2019     Lab Results   Component Value Date    TSH 1.5 01/21/2017     Lab Results Component Value Date    CHOL 208 04/14/2019    CHOLHDL 46 04/14/2019    CHOLDLCAL 111 (H) 04/14/2019    TRIGLY 257 (H) 04/14/2019     Lab Results   Component Value Date    VITD25OH 37 08/05/2018         Studies  US Duplex Lower Extremity Veins Left    Result Date: 04/28/2019  IMPRESSION:  No evidence of deep venous thrombosis of the left lower extremity veins. Signed by: Alexia Freestone   04/28/2019 10:00 AM        Assessment/Plan:       ICD-10-CM    1. Leg edema, left R60.0 BNP     Albumin/Creat Ratio Ur   2. Umbilical hernia without obstruction and without gangrene K42.9 Referral to Surgery   3. Low vitamin D level R79.89    4. Overweight E66.3          Problem   Leg Edema, Left    06/08/2019 - chornic, mild, workup has been reassuring including doppler, will check BNP and alb-cr ratio to complete the workup.  Otherwise continue with elevation and avoiding salt.     Umbilical Hernia    05/27/2017 - reducible, unlikely to be the main cause of the abd pain, unless becomes symptoms does not require further workup at this time, will monitor.  10/07/2017 - counseled on indications for additional imaging or surgical management, advised f/u if any worsening.  07/08/2018- stable, no treatment at this time as rarely symptoms with only mild discomfort  06/08/2019 - occasionally with symptoms, discussed role of surgery and will re-refer       Low Vitamin D Level    taking antiepiletpic which may decrease vit D level  03/04/2018- adequate recent level, can change from weekly to daily supplementation  06/08/2019 - recheck today     Overweight    04/02/2016 - neg screen for OSA  06/08/2019 - stable, counseled on exercise         The above plan of care, diagnosis, orders, and follow-up were discussed with the patient.  Questions related to this recommended plan of care were answered.    Follow up: Return if symptoms worsen or fail to improve.    Lendon Collar Jarrin Staley,  MD, MPH  06/08/2019 at 5:32 PM Time of note filed does not necessarily reflect the time of encounter.

## 2019-06-09 LAB — B-Type Natriuretic Peptide: BNP: 22 pg/mL (ref <100)

## 2019-06-09 LAB — Albumin/Creatinine Ratio,Urine: ALBUMIN/CREATININE RATIO: <12 mg/L

## 2019-06-22 ENCOUNTER — Telehealth: Payer: MEDICARE

## 2019-06-22 NOTE — Telephone Encounter
Rx Prior Authorization Request:  Rx name w/ dosage (strength/frequency): Tretinoin Microsphere (RETIN-A MICRO PUMP) 0.06 % GEL    Patient's Insurance: [employer]   [phone]    Preferred Pharmacy: [facility]     Reason PA needed: non formulary     Level of urgency: High     Janelle @ [facility]   Phone: [phone]  Reference #[id number]

## 2019-06-26 NOTE — Telephone Encounter
Forwarded by: Thompson Grayer,. LVN       PA needed.

## 2019-06-27 NOTE — Telephone Encounter
Called [facility] and spoke to [place].   He stated that no PA is required and that it looks like it was just requested to be filled a little too soon. He ran a live claim for today's DOS and he did a claim for one month out and they both went through without issues. Shanon Brow stated that if it doesn't go through , it could be a pharmacy issue and they would need to call the pharmacy hotline. I called HUSEIN GUEDES to advise but he didn't answer. Left a detailed message with all information above and asked him to call back if he had any questions or concerns.  Magenta Schmiesing Walker, MA 3:02 PM

## 2019-06-27 NOTE — Telephone Encounter
PDL Call to Practice    Reason for Call: Patient is calling regarding previous message. He is very adamant about speaking with Dr. Andrey Farmer nurse.    Per Margreta Journey, there isn't a nurse available at this time and advised to send a high priority message. Per patient, this is okay and he will wait for a call back.    MD: Dr. Lavada Mesi    Appointment Related?  []  Yes  [x]  No     If yes;  Date:  Time:    Call warm transferred to PDL: []  Yes  [x]  No    Call Received by Practice Representative: Margreta Journey

## 2019-06-29 MED ORDER — ATORVASTATIN CALCIUM 40 MG PO TABS
ORAL_TABLET | 3 refills
Start: 2019-06-29 — End: ?

## 2019-06-30 MED ORDER — ATORVASTATIN CALCIUM 40 MG PO TABS
ORAL_TABLET | 3 refills | Status: AC
Start: 2019-06-30 — End: ?

## 2019-07-26 NOTE — Progress Notes
RHEUMATOLOGY OUTPATIENT CONSULTATION  PATIENT: Dustin Watts MRN: [id number] DOB: [birth year]  REASONS FOR VISIT/CHIEF COMPLAINT:OA      HPI:62 y.o. male former pt of Dr Roselee Nova with generalized OA, OK knees here to establish care.  Hx of epilepsy, pre-diabetic, HTN, HLD, vit D deficiency      Current visit patient reports the followings:-  - staying home due to covid  - L knee is not bad; occasional small pain with walking ''do I have fluid in my knee''  - Ibuprofen 800 mg; tried not to take too many; 4-5 times a month or so could be more e.g before going out/walking    03/2019  - L knee doing OK; last injection pain after the procedure lasted 1.5 month; now fine; walking fine  - L ankle strain earlier this year not recall splint  - Ibuprofen 800 mg prn BID but not need them; taking oxycodone as needed on average once at night for the chronic LBP    09/2018  - overall good no falls; L knee some swelling and worsening pain wondering if take fluid out or if any fluid inside  - ibuprofen as needed 800 mg 2 times a day only prn for years no problem with food no indigestion  - otherwise doing well      11/2017   -last visit increasing knee pain XR R knee good L knee mild OA   - doing well; no seizure any longer  - joints are not bad; no pain with walking; no need to take ibuprofen regularly; takes infrequently- a day before and on day out e.g going to [place]; requesting refill; no problem no GERD/taking with food    08/2017  -last visit 02/2017 joints are doing fine on prn NSAID/post CVA  -knees are doing not too badly ''only after long walk'' ''described as blown up'', keen to have refill ibuprofen 800 mg PRN given by Dr Roselee Nova in past ''I only take it if I have a long walk to do so taking infrequently''; no stiffness; at times puffy not today; slight worse lately with pain   -other joints are fine  -chronic R sided atrophy since borne Dx cerebral palsy otherwise in good health  -no seizure for some time now No known history of malar rash, photosensitivity rash, discoid lesions, oral/nasal ulcers,  pleuritic chest pain, seizures/psychosis, Raynaud's, skin tightness, dry eyes/mouth, hair loss or blood clots.  No history of muscle pain or proximal myopathy.  No history for fever, weight loss, loss of appetite, night sweat or lymphadenopathy.  No history of skin psoriasis, eye issues, STDs or IBD.    No recent travelling.  No sick contacts.    Prior medication/Joint injections/Immune suppressant therapy:  None    ROS:  A 14 point ROS was obtained and significant for what is mentioned in HPI and below.    ROS    (Check if Normal, or note + findings):   []    reviewed questionnaire on 07/25/2019    []   Not Obtainable :   [x]    Constit : [-] fever, [-] weight loss    [x]    Skin :[-] vasculitic palpable rash, [-] tender nodules, [-]scaling plaques      [x]    Eyes : [-] photophobia, [-] red eye [-] gritty eyes    [x]    CV :[-] LE swelling, orthopnea, [-] PND,    []    MS :     [x]    ENT: [-] ear /nose swelling, [-]  throat pain or post nasal d/c   [x]    GI :[-] constipation [-] diarrhea  [-] melena [-]heartburn,   [x]    Heme/Lymph :[-] easy bruising or bleeding      [x]   Resp : [-] DOE, [-] wheezing [-] pleurisy   [x]    GU:[-]dysruria, [-] gross hematuria   []    Neuro :[-] focal neurological [-] wrist or foot drop     [x]    Imm/All :[-] recurrent infections in past, [-] pollen or seasonal allergy   [x]   Endo :   [x]    Psych: [-] mood, [-] memory     PMHx:  Past Medical History:   Diagnosis Date   ? Acne    ? Arthritis    ? Cerebral palsy (HCC/RAF)    ? Eczema    ? Hypertension    ? Impaired glucose tolerance 03/23/2016    HGBA1C of 5.7 on 03/19/2016    ? Low vitamin D level 05/07/2016    05/07/2016 - taking antiepiletpic which may exacerbate it, started on ergo 50000, will recheck level in about 2 months    ? Overweight    ? Seizure (HCC/RAF) 1973    chronically on Dilantin, no seizure in 25 years, followed by Neurologist (Dr. Wiliam Ke) at [facility]   ? Shortening of arm, congenital     right arm       PSHx:  Past Surgical History:   Procedure Laterality Date   ? rectal abscess  2010    surgery done at [facility]       Meds:    Medications that the patient states to be currently taking   Medication Sig   ? benzoyl peroxide-erythromycin 5-3% gel Apply to acne once daily..   ? Calcium Carbonate-Vitamin D (OYSTER SHELL CALCIUM + VITAMIN D) 500-200 mg-unit TABS tablet TAKE 1 TABLET BY MOUTH 3 TIMES A DAY   ? ibuprofen 800 mg tablet Take 1 tablet (800 mg total) by mouth daily as needed.   ? oxyCODONE 5 mg tablet Take 1 tablet (5 mg total) by mouth two (2) times daily as needed. Max Daily Amount: 10 mg   ? RETIN-A MICRO PUMP 0.04 % gel Apply topically at bedtime.   ? sulfacetamide-sulfur 9-4.5 % LIQD liquid Apply to face twice daily.   ? Tretinoin Microsphere (RETIN-A MICRO PUMP) 0.06 % GEL Apply pea sized amount to face nightly.   ? [DISCONTINUED] IBUPROFEN 800 mg tablet TAKE 1 TABLET BY MOUTH EVERY 12 HOURS AS NEEDED FOR MODERATE PAIN (PAIN SCALE 4-6)       Allergies:  Allergies   Allergen Reactions   ? Sulfa Antibiotics Angioedema     No anaphylaxis   ? Amlodipine Other (See Comments)     Lip swelling without anaphylaxis   ? Carvedilol Angioedema     Lip swelling without anaphylaxis   ? Losartan Rash     No hive, no anaphylaxis, unclear if truly has rash from this medicine   ? Penicillins Rash     ''bumps on body''       SocHx:  Social History     Tobacco Use   ? Smoking status: Never Smoker   ? Smokeless tobacco: Never Used   Substance Use Topics   ? Alcohol use: No     Works in Loss adjuster, chartered; single    FamHx:  Family History   Problem Relation Age of Onset   ? Heart disease Mother    ?  Diabetes Mother    ? Kidney disease Mother    ? Stroke Brother    ? Colon cancer Neg Hx    ? Prostate cancer Neg Hx    ? Ovarian cancer Neg Hx    ? Breast cancer Neg Hx    ? Hyperlipidemia Neg Hx    ? Hypertension Neg Hx Physical Exam:      BP 144/89  ~ Pulse 77  ~ Temp 36.6 ?C (97.8 ?F) (Forehead)  ~ Resp 17  ~ Ht 6' (1.829 m) Comment: pt. self reported height ~ Wt 216 lb (98 kg)  ~ SpO2 97% Comment: Room air ~ BMI 29.29 kg/m?   Pain Information     No pain information on file          BP 143/85  ~ Pulse 78  ~ Temp 36.6 ?C (97.8 ?F) (Oral)  ~ Resp 18  ~ Ht 6' (1.829 m) Comment: Pt. self reported height ~ Wt 211 lb (95.7 kg)  ~ SpO2 97% Comment: Room air ~ BMI 28.62 kg/m?   Pain Information     No pain information on file            BP 134/83  ~ Pulse 70  ~ Temp 36.4 ?C (97.5 ?F) (Oral)  ~ Resp 18  ~ Ht 6' (1.829 m)  ~ Wt 209 lb (94.8 kg)  ~ SpO2 97%  ~ BMI 28.35 kg/m?      BP 128/72  ~ Pulse 70  ~ Temp 36.4 ?C (97.5 ?F) (Oral)  ~ Resp 16  ~ Ht 6' (1.829 m)  ~ Wt 205 lb (93 kg)  ~ SpO2 97%  ~ BMI 27.80 kg/m?     Wt Readings from Last 3 Encounters:   06/08/19 216 lb 6.4 oz (98.2 kg)   04/14/19 216 lb (98 kg)   04/06/19 213 lb (96.6 kg)     BP Readings from Last 3 Encounters:   06/08/19 153/89   04/14/19 155/90   04/06/19 150/82       NAD  No ocular injection  No alopecia  OP clear  No LAD  CTAB  RRR  S/NT/ND  No C/C/E  No rashes  No telangectasia/discoloration/calcinosis/sclerodactyly  No tender joints  No swollen joints  L knee crepitus+ no effusion  No enthesitis  No dactylitis  No myofascial pain  Neuro grossly intact except R extremities atrophy  Peripheral pulses intact      RIGHT         LEFT   TMJ [] []   ~ [] []  TMJ   Shoulder [] []  ~ [] []  Shoulder                Left box: Tender   Elbow [] []  ~ [] []  Elbow               Right box: Swollen   Wrist [] []  ~ [] []  Wrist   CMC1 [] []  ~ [] []  CMC1       MCP5 MCP4 MCP3 MCP2 MCP1         ~ MCP1 MCP2 MCP3 MCP4 MCP5       [] []  [] []  [] []  [] []  [] []   [] []  [] []  [] []  [] []  [] []        PIP5 PIP4 PIP3 PIP2 IP1 ~ IP1 PIP2 PIP3 PIP4 PIP5       [] []  [] []  [] []  [] []  [] []   [] []  [] []  [] []  [] []  [] []        DIP5 DIP4 DIP3 DIP2  ~  DIP2 DIP3 DIP4 DIP5       [] []  [] []  [] []  [] []     [] []  [] []  [] []  [] []   Sacroiliac []  ~             []  Sacroiliac                Hip  [] []  ~        [] []  Hip                  Knee  [] []  ~        [] []  Knee        Ankle    [] []  ~        [] []  Ankle           MTP5 MTP4 MTP3 MTP2 MTP1 ~ MTP1 MTP2 MTP3 MTP4 MCP5       [] []  [] []  [] []  [] []  [] []   [] []  [] []  [] []  [] []  [] []        Toe5 Toe4 Toe3 Toe2 Toe1 ~ Toe1 Toe2 Toe3 Toe4 Toe5        [] []  [] []  [] []  [] []  [] []   [] []  [] []  [] []  [] []  [] []                      Labs:  Lab Results   Component Value Date    WBC 4.55 03/16/2019    HGB 13.4 (L) 03/16/2019    HCT 40.2 03/16/2019    PLT 194 03/16/2019    NEUTABS 2.59 03/16/2019    LYMPHABS 1.35 03/16/2019     Lab Results   Component Value Date    GLUCOSE 98 03/16/2019    GFREST >89 04/18/2014    CREAT 0.58 (L) 03/16/2019    CALCIUM 8.6 03/16/2019    TOTPRO 6.8 03/16/2019    ALBUMIN 4.5 03/16/2019    AST 21 03/16/2019    ALT 18 03/16/2019    ALKPHOS 99 03/16/2019     @RRCOAGS @  Lab Results   Component Value Date    TSH 1.5 01/21/2017       Rheum labs:  Lab Results   Component Value Date    SRWEST 13 (H) 05/27/2017    SRWEST 15 (H) 11/28/2014     No results found for: CARDLIPIGA, CARDLIPIGG, CARDLIPIGM, ANAAB, ANAABTITER, B2GLYPROIGA, B2GLYPROIGG, B2GLYPROIGM, DRVVT, RNPAB, SMAB, SSA  No results found for: C3, C4, DSDNAAB, NDNAABIFA  No results found for: Lyman Bishop, MPOAB  Lab Results   Component Value Date    CKTOT 68 04/23/2008    URICACID 3.6 11/28/2014     Lab Results   Component Value Date    VITD25OH 37 08/05/2018    VITD25OH 45 01/10/2018     UA:   Lab Results   Component Value Date    PROTCLUR Trace 04/23/2008    BLDUR Negative 04/23/2008    LEUKESTUR Negative 04/23/2008    RBCSUR 5 04/23/2008    WBCSUR 3 04/23/2008       Studies: [x] reviewed [] ordered [] independently reviewed.  Component      Latest Ref Rng & Units 09/16/2018   Segmented Neutrophil      No Reference Range % 1   Lymphocyte      No Reference Range % 1   Monocyte      No Reference Range % 98   Total Cells      cells 100 Fluid Appearance       Viscoid . . .   RBC Count,Joint Fl      No Reference Range /cmm <1,000   WBC Count,Joint Fl      No Reference Range /cmm 282   Crystals,Fluid #1  No Crystals Seen No Crystals Seen       Imaging:    Knees XR 08/2017   Mild osteoarthritis of the left knee.  No significant arthritis of the right knee.    Knees 2007 normal     ASSESSMENT AND PLAN  Dustin Watts is a 63 y.o. male     Knees OA stable on prn prn ibuprofen 800 mg as needed; no GERD; pt takes rarely; recent slight worsening with effusion in L knee s/p CSI improved currently no significant effusion stable on PRN NSAID  Hx of epilepsy stable   pre-diabetic, HTN, HLD  Hx vit D deficiency WNL 02/2017  overweight    RECOMMENDATIONS  - ibuprofen 800 mg as PRN daily for knee pain/sied effects discussed rarely need  - labs NSAID toxicity labs today  - weight reduction  - no need L knee aspiration CSI this visit will continue to monitor   - continue to loose weight  - explain in detail and all questions answered      Orders Placed This Encounter   ? CBC & Auto Differential   ? Comprehensive Metabolic Panel   ? ibuprofen 800 mg tablet         Orders Placed This Encounter   ? Inject/Drain Knee   ? CBC & Auto Differential   ? Comprehensive Metabolic Panel   ? Crystals,Fluid   ? Joint Fluid Cell Count & Differential   ? ibuprofen 800 mg tablet           RTC  3 months or sooner as needed.  CC note to PCP/referred physician        Orders Placed This Encounter   ? ibuprofen 800 mg tablet           IMMUNIZATION STATUS  Immunization History   Administered Date(s) Administered   ? Tdap 03/08/2016   ? influenza vaccine IM quadrivalent (Fluarix Quad) (PF) SYR (60 months of age and older) 08/04/2017, 09/08/2018   ? influenza vaccine IM quadrivalent (Fluzone Quad) (PF) SYR/SDV (43 months of age and older) 08/06/2016   ? influenza, unspecified formulation 08/06/2016, 08/04/2017 Thank you for allowing me to participate in this patient's care.  If I can be of further service, please contact my office at  [phone].  [x]  >50%  of this * minute visit was spent in face-to-face evaluation and counseling the patient.  The above plan of care, diagnosis, orders, and follow-up were discussed with the patient. Questions related to this recommended plan of care were answered  []  Immunosuppression drugs changed. [x]  Risks/benefits/toxicities of meds discussed.   []  Reviewed and summarized old records. []  Requested outside medical records.  []  Discussed with other providers of complex medical conditions and management plans outlined above    Liana Gerold MD Rheumatology Division, [place] [phone])

## 2019-07-27 ENCOUNTER — Ambulatory Visit: Payer: MEDICAID

## 2019-07-27 DIAGNOSIS — M170 Bilateral primary osteoarthritis of knee: Secondary | ICD-10-CM

## 2019-07-27 DIAGNOSIS — R52 Pain, unspecified: Secondary | ICD-10-CM

## 2019-07-27 DIAGNOSIS — Z79899 Other long term (current) drug therapy: Secondary | ICD-10-CM

## 2019-07-27 MED ORDER — IBUPROFEN 800 MG PO TABS
800 mg | ORAL_TABLET | Freq: Every day | ORAL | 1 refills | Status: AC | PRN
Start: 2019-07-27 — End: ?

## 2019-07-27 NOTE — Patient Instructions
Do labs today to ensure medication toxicity    MEDICATION: IBUPROFEN (Adult)  Ibuprofen (brand name: Motrin) is an anti-inflammatory drug. It is also available over the counter in a lower strength as Advil, Motrin IB, and other brand names. It is very useful for pain, fever, and inflammation.  DIRECTIONS FOR USE:  You may take this medicine with food or milk to reduce stomach upset.  WHAT TO WATCH FOR:  POSSIBLE SIDE EFFECTS: Nausea, upper abdominal pain, dizziness (Contact your doctor if these symptoms persist or become severe). Bleeding from the stomach, which may appear as blood in vomit or stool (red or black color); rapid weight gain, leg swelling or easy bruising, change in the amount of urine, confusion/mood changes, very stiff neck, yellowing of the eyes/skin (Contact your doctor or return to this facility promptly).  ALLERGIC REACTION: Rash, itching, swelling, trouble breathing or swallowing (Contact your doctor or return to this facility promptly).  MEDICAL CONDITIONS: Before starting this medicine, be sure your doctor knows if you have any of the following conditions:  ? Stomach ulcer (active or in the past), history of vomiting blood or bloody stool  ? Chronic alcoholism, or if you consume more than 3 alcoholic drinks daily  ? Allergic reaction to aspirin or other anti-inflammatory medicines  ? Asthma, nasal polyps, or angioedema; pregnancy or breastfeeding  ? Liver or kidney disease; bleeding disorder, diabetes  DRUG INTERACTION: Before starting this medicine, be sure your doctor knows if you are taking any of the following drugs:  ? Blood thinners [Coumadin (warfarin), low molecular weight heparins, heparin], water pills [Lasix (furosemide), hydrochlorothiazide, Dyazide], blood pressure medicine, diabetes pills, corticosteroids [methylprednisolone, prednisone], aspirin or other anti-inflammatory drugs, Lanoxin (digoxin), methotrexate, cyclosporine, cidofovir, antiplatelet drugs such as Pletal (cilostazol) or Plavix (clopidogrel), desmopressin, drugs for osteoporosis [Fosamax (alendronate)], lithium, pemetrexed, probenecid, bile acid binding resins, drotrecogin, SSRI antidepressants (fluoxetine, sertraline)  WARNINGS:  ? Do not take with prednisone, other anti-inflammatory drugs, or alcohol since this increases the risk of getting a bleeding ulcer.  This drug may cause dizziness. Do not drive, ride a bicycle, or operate dangerous equipment while taking this medicine until you know how it will affect you.  [NOTE: This information topic may not include all directions, precautions, medical conditions, drug/food interactions, and warnings for this drug. Check with your doctor, nurse, or pharmacist for any questions that you may have.]  ? [address] [place], [place] [address]. All rights reserved. This information is not intended as a substitute for professional medical care. Always follow your healthcare professional's instructions.

## 2019-08-10 ENCOUNTER — Telehealth: Payer: MEDICARE

## 2019-08-10 ENCOUNTER — Telehealth: Payer: BLUE CROSS/BLUE SHIELD

## 2019-08-10 NOTE — Telephone Encounter
Thank you!    Dustin Buresh G.

## 2019-08-10 NOTE — Telephone Encounter
Thanks.  Chyrl Civatte at the [facility] [place] is very good.  Please see if the patient can schedule with him.  Thanks!

## 2019-08-10 NOTE — Telephone Encounter
Please see PDL encounter with my documentation in regards to this call.    Thanks,  Celine Ahr.

## 2019-08-10 NOTE — Telephone Encounter
Patient called to find out why nobody has called him to schedule a consult for his Hernia. Patient refused to take down the number I was going to give him for the Surgery department & requested a call back, please advise.    Thank you,    Thayer Headings

## 2019-08-10 NOTE — Telephone Encounter
PDL Call to Practice    Reason for Call: Pt is following up with office because MD referred him out to specialist for his hernia but he never got a call back from the office. He said it's been 2 months. Pt requested to speak to [place].   MD: Dr. Elwanda Brooklyn     Appointment Related?  []  Yes  [x]  No     If yes;  Date:  Time:    Call warm transferred to PDL: [x]  Yes  []  No    Call Received by Practice Representative: Thayer Headings

## 2019-09-07 ENCOUNTER — Telehealth: Payer: BLUE CROSS/BLUE SHIELD

## 2019-09-21 ENCOUNTER — Ambulatory Visit: Payer: BLUE CROSS/BLUE SHIELD

## 2019-10-03 ENCOUNTER — Telehealth: Payer: BLUE CROSS/BLUE SHIELD

## 2019-10-03 NOTE — Telephone Encounter
Good morning Dr. Elwanda Brooklyn,    Just an fyi.    Thanks,  Celine Ahr.

## 2019-10-03 NOTE — Telephone Encounter
Message to Practice/Provider    MD: Dr. Elwanda Brooklyn    Message: Per pt called and stated that he got the appt to see the Dr. for his hernia who is Dr. Smitty Cords on 10/10/19 if you would like to speak to him. Please advise at your earliest convenience.     He would also like to wish you a Happy Thanksgiving.    Return call is not being requested by the patient or caller.    Patient or caller has been notified of the 24-48 hour processing turnaround time if applicable.

## 2019-10-05 NOTE — Telephone Encounter
I sent an email to the surgeon to advise him of the upcoming appointment with the patient.

## 2019-10-10 ENCOUNTER — Ambulatory Visit: Payer: MEDICAID

## 2019-10-19 ENCOUNTER — Ambulatory Visit: Payer: MEDICARE

## 2019-10-19 DIAGNOSIS — L700 Acne vulgaris: Secondary | ICD-10-CM

## 2019-10-19 DIAGNOSIS — L280 Lichen simplex chronicus: Secondary | ICD-10-CM

## 2019-10-19 MED ORDER — BENZOYL PEROXIDE-ERYTHROMYCIN 5-3 % EX GEL
11 refills | Status: AC
Start: 2019-10-19 — End: ?

## 2019-10-19 MED ORDER — SULFACETAMIDE SODIUM-SULFUR 9-4.5 % EX LIQD
11 refills | Status: AC
Start: 2019-10-19 — End: ?

## 2019-10-19 MED ORDER — RETIN-A MICRO PUMP 0.06 % EX GEL
ORAL | 11 refills | 30.00000 days | Status: AC
Start: 2019-10-19 — End: ?

## 2019-10-19 NOTE — Progress Notes
PATIENT: Dustin Watts  MRN: [id number]  DOB: 1955/12/02  DATE OF SERVICE: 10/19/2019    REFERRING PRACTITIONER: No ref. provider found  PRIMARY CARE PROVIDER: Murtis Sink., MD, MPH  CHIEF COMPLAINT:   Chief Complaint   Patient presents with   ? Acne     follow up       Subjective:      Dustin Watts is a 63 y.o. year old male who was seen in clinic for follow up acne and lichen simplex chronicus.    Last seen 01/12/2019    Needs refills - on his last ones. On retin-a micro pump 0.06% which was originally given because his pharmacy didn't have the 0.04%.    On sulfacetamide sulfur wash (tolerates fine despite h/o sulfa allergy) and benzamycin gel daily as needed.    Wonders if it could be creams. He puts various serums, collagen cream, roc cream, tightening cream, something from a TV dermatologist.     Past Medical History:   Diagnosis Date   ? Acne    ? Arthritis    ? Cerebral palsy (HCC/RAF)    ? Eczema    ? Hypertension    ? Impaired glucose tolerance 03/23/2016    HGBA1C of 5.7 on 03/19/2016    ? Low vitamin D level 05/07/2016    05/07/2016 - taking antiepiletpic which may exacerbate it, started on ergo 50000, will recheck level in about 2 months    ? Overweight    ? Seizure (HCC/RAF) 1973    chronically on Dilantin, no seizure in 25 years, followed by Neurologist (Dr. Wiliam Ke) at [facility]   ? Shortening of arm, congenital     right arm     Past Surgical History:   Procedure Laterality Date   ? rectal abscess  2010    surgery done at [facility] History   Problem Relation Age of Onset   ? Heart disease Mother    ? Diabetes Mother    ? Kidney disease Mother    ? Stroke Brother    ? Colon cancer Neg Hx    ? Prostate cancer Neg Hx    ? Ovarian cancer Neg Hx    ? Breast cancer Neg Hx    ? Hyperlipidemia Neg Hx    ? Hypertension Neg Hx        Outpatient Medications Prior to Visit   Medication Sig   ? ASPIRIN LOW DOSE 81 MG EC tablet TAKE 1 TABLET BY MOUTH EVERY DAY ? benzoyl peroxide-erythromycin 5-3% gel Apply to acne once daily..   ? Calcium Carbonate-Vitamin D (OYSTER SHELL CALCIUM + VITAMIN D) 500-200 mg-unit TABS tablet TAKE 1 TABLET BY MOUTH 3 TIMES A DAY   ? ibuprofen 800 mg tablet Take 1 tablet (800 mg total) by mouth daily as needed.   ? oxyCODONE 5 mg tablet Take 1 tablet (5 mg total) by mouth two (2) times daily as needed. Max Daily Amount: 10 mg   ? RETIN-A MICRO PUMP 0.04 % gel Apply topically at bedtime.   ? sulfacetamide-sulfur 9-4.5 % LIQD liquid Apply to face twice daily.   ? Tretinoin Microsphere (RETIN-A MICRO PUMP) 0.06 % GEL Apply pea sized amount to face nightly.   ? ATORVASTATIN 40 mg tablet TAKE 1 TABLET BY MOUTH EVERY DAY (Patient not taking: Reported on 07/27/2019)     No facility-administered medications prior to visit.  Allergies   Allergen Reactions   ? Topiramate      Eye swelling   ? Sulfa Antibiotics Angioedema     No anaphylaxis   ? Amlodipine Other (See Comments)     Lip swelling without anaphylaxis   ? Carvedilol Angioedema     Lip swelling without anaphylaxis   ? Losartan Rash     No hive, no anaphylaxis, unclear if truly has rash from this medicine   ? Penicillins Rash     ''bumps on body''         Review of Systems:  Constitutional: negative  Skin:  otherwise negative      Objective:        General:   alert, appears stated age and cooperative     Neurologic:   Grossly normal   Psychiatric:   oriented to time, place and person, mood and affect are within normal limits     Skin Exam:  Skin Type: 4  Head/face: examined  Eyes (lids/conjunctiva): examined  Lips/gums/teeth: examined  Neck: examined  All areas examined were within normal limits with the following exceptions: face appears clear today. Scattered skin colored papules on face - stable.     Lab Visit on 07/27/2019   Component Date Value Ref Range Status   ? Sodium 07/27/2019 140  135 - 146 mmol/L Final   ? Potassium 07/27/2019 4.1  3.6 - 5.3 mmol/L Final ? Chloride 07/27/2019 104  96 - 106 mmol/L Final   ? Total CO2 07/27/2019 24  20 - 30 mmol/L Final   ? Anion Gap 07/27/2019 12  8 - 19 mmol/L Final   ? Glucose 07/27/2019 101* 65 - 99 mg/dL Final   ? GFR Estimate for Non-African Ameri* 07/27/2019 >89  See GFR Additional Information mL/min/1.5m2 Final   ? GFR Estimate for African American 07/27/2019 >89  See GFR Additional Information mL/min/1.29m2 Final   ? GFR Additional Information 07/27/2019 See Comment   Final    GFR >89        Normal  GFR 60 - 89    Normal to mildly decreased  GFR 45 - 59    Mildly to moderately decreased  GFR 30 - 44    Moderately to severely decreased  GFR 15 - 29    Severely decreased  GFR <15        Kidney failure  The CKD-EPI equation was used to estimate GFR and assumes stable creatinine concentrations.  Results are in mL/min/1.73 square meters.   ? Creatinine 07/27/2019 0.59* 0.60 - 1.30 mg/dL Final   ? Urea Nitrogen 07/27/2019 9  7 - 22 mg/dL Final   ? Calcium 16/08/9603 8.8  8.6 - 10.4 mg/dL Final   ? Total Protein 07/27/2019 7.2  6.1 - 8.2 g/dL Final   ? Albumin 54/07/8118 4.6  3.9 - 5.0 g/dL Final   ? Bilirubin,Total 07/27/2019 0.3  0.1 - 1.2 mg/dL Final   ? Alkaline Phosphatase 07/27/2019 100  37 - 113 U/L Final   ? Aspartate Aminotransferase 07/27/2019 18  13 - 47 U/L Final   ? Alanine Aminotransferase 07/27/2019 18  8 - 64 U/L Final   ? White Blood Cell Count 07/27/2019 5.46  4.16 - 9.95 x10E3/uL Final   ? Red Blood Cell Count 07/27/2019 4.16* 4.41 - 5.95 x10E6/uL Final   ? Hemoglobin 07/27/2019 13.8  13.5 - 17.1 g/dL Final   ? Hematocrit 14/78/2956 41.1  38.5 - 52.0 % Final   ? Mean Corpuscular Volume  07/27/2019 98.8* 79.3 - 98.6 fL Final   ? Mean Corpuscular Hemoglobin 07/27/2019 33.2  26.4 - 33.4 pg Final   ? MCH Concentration 07/27/2019 33.6  31.5 - 35.5 g/dL Final   ? Red Cell Distribution Width-SD 07/27/2019 49.5* 36.9 - 48.3 fL Final   ? Red Cell Distribution Width-CV 07/27/2019 13.6  11.1 - 15.5 % Final ? Platelet Count, Auto 07/27/2019 173  143 - 398 x10E3/uL Final   ? Mean Platelet Volume 07/27/2019 11.7  9.3 - 13.0 fL Final   ? Nucleated RBC%, automated 07/27/2019 0.0  No Ref. Range % Final    Percent Reference Range Not Reported per accrediting agency   ? Absolute Nucleated RBC Count 07/27/2019 0.00  0.00 - 0.00 x10E3/uL Final   ? Neutrophil Abs (Prelim) 07/27/2019 3.13  See Absolute Neut Ct. x10E3/uL Final    This is a preliminary result.  If automated differential see Absolute Neut  Count or if manual differential see Absolute Neut Ct, Manual for final result.   ? Neutrophil Percent, Auto 07/27/2019 57.4  No Ref. Range % Final    Percent reference range not reported per accrediting agency.     ? Lymphocyte Percent, Auto 07/27/2019 31.1  No Ref. Range % Final    Percent reference range not reported per accrediting agency.     ? Monocyte Percent, Auto 07/27/2019 7.7  No Ref. Range % Final    Percent reference range not reported per accrediting agency.     ? Eosinophil Percent, Auto 07/27/2019 2.9  No Ref. Range % Final    Percent reference range not reported per accrediting agency.     ? Basophil Percent, Auto 07/27/2019 0.7  No Ref. Range % Final    Percent reference range not reported per accrediting agency.     ? Immature Granulocytes% 07/27/2019 0.2  No Reference Range % Final    Percent reference range not reported per accrediting agency.     ? Absolute Neut Count 07/27/2019 3.13  1.80 - 6.90 x10E3/uL Final   ? Absolute Lymphocyte Count 07/27/2019 1.70  1.30 - 3.40 x10E3/uL Final   ? Absolute Mono Count 07/27/2019 0.42  0.20 - 0.80 x10E3/uL Final   ? Absolute Eos Count 07/27/2019 0.16  0.00 - 0.50 x10E3/uL Final   ? Absolute Baso Count 07/27/2019 0.04  0.00 - 0.10 x10E3/uL Final   ? Absolute Immature Gran Count 07/27/2019 0.01  0.00 - 0.04 x10E3/uL Final   Office Visit on 06/08/2019   Component Date Value Ref Range Status   ? BNP 06/08/2019 22  <100 pg/mL Final   ? Albumin/Creat Ratio 06/08/2019    Final Unable to calculate. Test result is?below detection?limit.   ? Albumin, Urine 06/08/2019 <12.0  No Reference Range mg/L Final   ? Creatinine,Random Urine 06/08/2019 118.6  No Reference Range mg/dL Final            Assessment:           Plan/ Recommendation:        Diagnoses and associated orders for this visit:    Acne - doing well, continue:  - benzoyl peroxide-erythromycin 5-3% gel; Apply to acne once daily.  - tretinoin microspheres (RETIN-A MICRO) 0.06% gel; Apply nightly to affected areas.  - Sulfacetamide Sodium-Sulfur (SUMADAN WASH) 9-4.5 % LIQD; Apply 1 drop topically two (2) times daily. WASH AFFECTED AREA(S) TWICE DAILY AS DIRECTED    Refilled - printed prescription for sulfacetamide wash. Others sent to [facility]. 11 refills on all.  Dermal nevi of face - reassurance      Assessment and plan were discussed with the patient.     Follow up: 12 months or sooner prn     Author:  Arbutus Ped 10/19/2019 8:40 AM

## 2019-10-20 NOTE — Progress Notes
RHEUMATOLOGY OUTPATIENT CONSULTATION  PATIENT: Dustin Watts MRN: [id number] DOB: Jun 15, 1956  REASONS FOR VISIT/CHIEF COMPLAINT:OA      HPI:63 y.o. male former pt of Dustin Watts with generalized OA, OK knees here to establish care.  Hx of epilepsy, pre-diabetic, HTN, HLD, vit D deficiency      Current visit patient reports the followings:-    07/2019  - staying home due to covid  - L knee is not bad; occasional small pain with walking ''do I have fluid in my knee''  - Ibuprofen 800 mg; tried not to take too many; 4-5 times a month or so could be more e.g before going out/walking    03/2019  - L knee doing OK; last injection pain after the procedure lasted 1.5 month; now fine; walking fine  - L ankle strain earlier this year not recall splint  - Ibuprofen 800 mg prn BID but not need them; taking oxycodone as needed on average once at night for the chronic LBP    09/2018  - overall good no falls; L knee some swelling and worsening pain wondering if take fluid out or if any fluid inside  - ibuprofen as needed 800 mg 2 times a day only prn for years no problem with food no indigestion  - otherwise doing well      11/2017   -last visit increasing knee pain XR R knee good L knee mild OA   - doing well; no seizure any longer  - joints are not bad; no pain with walking; no need to take ibuprofen regularly; takes infrequently- a day before and on day out e.g going to [place]; requesting refill; no problem no GERD/taking with food    08/2017  -last visit 02/2017 joints are doing fine on prn NSAID/post CVA  -knees are doing not too badly ''only after long walk'' ''described as blown up'', keen to have refill ibuprofen 800 mg PRN given by Dustin Watts in past ''I only take it if I have a long walk to do so taking infrequently''; no stiffness; at times puffy not today; slight worse lately with pain   -other joints are fine  -chronic R sided atrophy since borne Dx cerebral palsy otherwise in good health  -no seizure for some time now No known history of malar rash, photosensitivity rash, discoid lesions, oral/nasal ulcers,  pleuritic chest pain, seizures/psychosis, Raynaud's, skin tightness, dry eyes/mouth, hair loss or blood clots.  No history of muscle pain or proximal myopathy.  No history for fever, weight loss, loss of appetite, night sweat or lymphadenopathy.  No history of skin psoriasis, eye issues, STDs or IBD.    No recent travelling.  No sick contacts.    Prior medication/Joint injections/Immune suppressant therapy:  None    ROS:  A 14 point ROS was obtained and significant for what is mentioned in HPI and below.    ROS    (Check if Normal, or note + findings):   []    reviewed questionnaire on 10/20/2019    []   Not Obtainable :   [x]    Constit : [-] fever, [-] weight loss    [x]    Skin :[-] vasculitic palpable rash, [-] tender nodules, [-]scaling plaques      [x]    Eyes : [-] photophobia, [-] red eye [-] gritty eyes    [x]    CV :[-] LE swelling, orthopnea, [-] PND,    []    MS :     [x]    ENT: [-]  ear /nose swelling, [-] throat pain or post nasal d/c   [x]    GI :[-] constipation [-] diarrhea  [-] melena [-]heartburn,   [x]    Heme/Lymph :[-] easy bruising or bleeding      [x]   Resp : [-] DOE, [-] wheezing [-] pleurisy   [x]    GU:[-]dysruria, [-] gross hematuria   []    Neuro :[-] focal neurological [-] wrist or foot drop     [x]    Imm/All :[-] recurrent infections in past, [-] pollen or seasonal allergy   [x]   Endo :   [x]    Psych: [-] mood, [-] memory     PMHx:  Past Medical History:   Diagnosis Date   ? Acne    ? Arthritis    ? Cerebral palsy (HCC/RAF)    ? Eczema    ? Hypertension    ? Impaired glucose tolerance 03/23/2016    HGBA1C of 5.7 on 03/19/2016    ? Low vitamin D level 05/07/2016    05/07/2016 - taking antiepiletpic which may exacerbate it, started on ergo 50000, will recheck level in about 2 months    ? Overweight    ? Seizure (HCC/RAF) 1973 chronically on Dilantin, no seizure in 25 years, followed by Neurologist (Dustin Watts) at [facility]   ? Shortening of arm, congenital     right arm       PSHx:  Past Surgical History:   Procedure Laterality Date   ? rectal abscess  2010    surgery done at [facility]       Meds:    No outpatient medications have been marked as taking for the 10/25/19 encounter (Appointment) with Liana Gerold, MD, MS.       Allergies:  Allergies   Allergen Reactions   ? Topiramate      Eye swelling   ? Sulfa Antibiotics Angioedema     No anaphylaxis   ? Amlodipine Other (See Comments)     Lip swelling without anaphylaxis   ? Carvedilol Angioedema     Lip swelling without anaphylaxis   ? Losartan Rash     No hive, no anaphylaxis, unclear if truly has rash from this medicine   ? Penicillins Rash     ''bumps on body''       SocHx:  Social History     Tobacco Use   ? Smoking status: Never Smoker   ? Smokeless tobacco: Never Used   Substance Use Topics   ? Alcohol use: No     Works in Loss adjuster, chartered; single    FamHx:  Family History   Problem Relation Age of Onset   ? Heart disease Mother    ? Diabetes Mother    ? Kidney disease Mother    ? Stroke Brother    ? Colon cancer Neg Hx    ? Prostate cancer Neg Hx    ? Ovarian cancer Neg Hx    ? Breast cancer Neg Hx    ? Hyperlipidemia Neg Hx    ? Hypertension Neg Hx        Physical Exam:      BP 144/89  ~ Pulse 77  ~ Temp 36.6 ?C (97.8 ?F) (Forehead)  ~ Resp 17  ~ Ht 6' (1.829 m) Comment: pt. self reported height ~ Wt 216 lb (98 kg)  ~ SpO2 97% Comment: Room air ~ BMI 29.29 kg/m?   Pain Information     No pain information  on file          BP 143/85  ~ Pulse 78  ~ Temp 36.6 ?C (97.8 ?F) (Oral)  ~ Resp 18  ~ Ht 6' (1.829 m) Comment: Pt. self reported height ~ Wt 211 lb (95.7 kg)  ~ SpO2 97% Comment: Room air ~ BMI 28.62 kg/m?   Pain Information     No pain information on file BP 134/83  ~ Pulse 70  ~ Temp 36.4 ?C (97.5 ?F) (Oral)  ~ Resp 18  ~ Ht 6' (1.829 m)  ~ Wt 209 lb (94.8 kg)  ~ SpO2 97%  ~ BMI 28.35 kg/m?      BP 128/72  ~ Pulse 70  ~ Temp 36.4 ?C (97.5 ?F) (Oral)  ~ Resp 16  ~ Ht 6' (1.829 m)  ~ Wt 205 lb (93 kg)  ~ SpO2 97%  ~ BMI 27.80 kg/m?     Wt Readings from Last 3 Encounters:   07/27/19 214 lb (97.1 kg)   06/08/19 216 lb 6.4 oz (98.2 kg)   04/14/19 216 lb (98 kg)     BP Readings from Last 3 Encounters:   07/27/19 131/84   06/08/19 153/89   04/14/19 155/90       NAD  No ocular injection  No alopecia  OP clear  No LAD  CTAB  RRR  S/NT/ND  No C/C/E  No rashes  No telangectasia/discoloration/calcinosis/sclerodactyly  No tender joints  No swollen joints  L knee crepitus+ no effusion  No enthesitis  No dactylitis  No myofascial pain  Neuro grossly intact except R extremities atrophy  Peripheral pulses intact      RIGHT         LEFT   TMJ [] []   ~ [] []  TMJ   Shoulder [] []  ~ [] []  Shoulder                Left box: Tender   Elbow [] []  ~ [] []  Elbow               Right box: Swollen   Wrist [] []  ~ [] []  Wrist   CMC1 [] []  ~ [] []  CMC1       MCP5 MCP4 MCP3 MCP2 MCP1         ~ MCP1 MCP2 MCP3 MCP4 MCP5       [] []  [] []  [] []  [] []  [] []   [] []  [] []  [] []  [] []  [] []        PIP5 PIP4 PIP3 PIP2 IP1 ~ IP1 PIP2 PIP3 PIP4 PIP5       [] []  [] []  [] []  [] []  [] []   [] []  [] []  [] []  [] []  [] []        DIP5 DIP4 DIP3 DIP2  ~  DIP2 DIP3 DIP4 DIP5       [] []  [] []  [] []  [] []     [] []  [] []  [] []  [] []       Sacroiliac []  ~             []  Sacroiliac                Hip  [] []  ~        [] []  Hip                  Knee  [] []  ~        [] []  Knee        Ankle    [] []  ~        [] []  Ankle           MTP5 MTP4 MTP3 MTP2 MTP1 ~ MTP1 MTP2 MTP3 MTP4 MCP5       [] []  [] []  [] []  [] []  [] []   [] []  [] []  [] []  [] []  [] []   Toe5 Toe4 Toe3 Toe2 Toe1 ~ Toe1 Toe2 Toe3 Toe4 Toe5        [] []  [] []  [] []  [] []  [] []   [] []  [] []  [] []  [] []  [] []                      Labs:  Lab Results   Component Value Date    WBC 5.46 07/27/2019    HGB 13.8 07/27/2019 HCT 41.1 07/27/2019    PLT 173 07/27/2019    NEUTABS 3.13 07/27/2019    LYMPHABS 1.70 07/27/2019     Lab Results   Component Value Date    GLUCOSE 101 (H) 07/27/2019    GFREST >89 04/18/2014    CREAT 0.59 (L) 07/27/2019    CALCIUM 8.8 07/27/2019    TOTPRO 7.2 07/27/2019    ALBUMIN 4.6 07/27/2019    AST 18 07/27/2019    ALT 18 07/27/2019    ALKPHOS 100 07/27/2019     @RRCOAGS @  Lab Results   Component Value Date    TSH 1.5 01/21/2017       Rheum labs:  Lab Results   Component Value Date    SRWEST 13 (H) 05/27/2017    SRWEST 15 (H) 11/28/2014     No results found for: CARDLIPIGA, CARDLIPIGG, CARDLIPIGM, ANAAB, ANAABTITER, B2GLYPROIGA, B2GLYPROIGG, B2GLYPROIGM, DRVVT, RNPAB, SMAB, SSA  No results found for: C3, C4, DSDNAAB, NDNAABIFA  No results found for: Lyman Bishop, MPOAB  Lab Results   Component Value Date    CKTOT 68 04/23/2008    URICACID 3.6 11/28/2014     Lab Results   Component Value Date    VITD25OH 37 08/05/2018    VITD25OH 45 01/10/2018     UA:   Lab Results   Component Value Date    PROTCLUR Trace 04/23/2008    BLDUR Negative 04/23/2008    LEUKESTUR Negative 04/23/2008    RBCSUR 5 04/23/2008    WBCSUR 3 04/23/2008       Studies: [x] reviewed [] ordered [] independently reviewed.  Component      Latest Ref Rng & Units 09/16/2018   Segmented Neutrophil      No Reference Range % 1   Lymphocyte      No Reference Range % 1   Monocyte      No Reference Range % 98   Total Cells      cells 100   Fluid Appearance       Viscoid . . .   RBC Count,Joint Fl      No Reference Range /cmm <1,000   WBC Count,Joint Fl      No Reference Range /cmm 282   Crystals,Fluid #1      No Crystals Seen No Crystals Seen       Imaging:    Knees XR 08/2017   Mild osteoarthritis of the left knee.  No significant arthritis of the right knee.    Knees 2007 normal     ASSESSMENT AND PLAN  RIGEL FILSINGER is a 63 y.o. male Knees OA stable on prn prn ibuprofen 800 mg as needed; no GERD; pt takes rarely; recent slight worsening with effusion in L knee s/p CSI improved currently no significant effusion stable on PRN NSAID  Hx of epilepsy stable   pre-diabetic, HTN, HLD  Hx vit D deficiency WNL 02/2017  overweight    RECOMMENDATIONS  - ibuprofen 800 mg as PRN daily for knee pain/sied effects discussed rarely need  - labs NSAID toxicity labs today  - weight  reduction  - no need L knee aspiration CSI this visit will continue to monitor   - continue to loose weight  - explain in detail and all questions answered      Orders Placed This Encounter   ? CBC & Auto Differential   ? Comprehensive Metabolic Panel   ? ibuprofen 800 mg tablet         Orders Placed This Encounter   ? Inject/Drain Knee   ? CBC & Auto Differential   ? Comprehensive Metabolic Panel   ? Crystals,Fluid   ? Joint Fluid Cell Count & Differential   ? ibuprofen 800 mg tablet           RTC  3 months or sooner as needed.  CC note to PCP/referred physician        Orders Placed This Encounter   ? ibuprofen 800 mg tablet           IMMUNIZATION STATUS  Immunization History   Administered Date(s) Administered   ? Tdap 03/08/2016   ? influenza vaccine IM quadrivalent (Fluarix Quad) (PF) SYR (51 months of age and older) 08/04/2017, 09/08/2018   ? influenza vaccine IM quadrivalent (Fluzone Quad) (PF) SYR/SDV (8 months of age and older) 08/06/2016   ? influenza, unspecified formulation 08/06/2016, 08/04/2017     Thank you for allowing me to participate in this patient's care.  If I can be of further service, please contact my office at  [phone].  [x]  >50%  of this * minute visit was spent in face-to-face evaluation and counseling the patient.  The above plan of care, diagnosis, orders, and follow-up were discussed with the patient. Questions related to this recommended plan of care were answered  []  Immunosuppression drugs changed. [x]  Risks/benefits/toxicities of meds discussed. []  Reviewed and summarized old records. []  Requested outside medical records.  []  Discussed with other providers of complex medical conditions and management plans outlined above    Liana Gerold MD Rheumatology Division, [facility] [phone])

## 2019-10-25 ENCOUNTER — Ambulatory Visit: Payer: Medicaid HMO

## 2019-10-25 DIAGNOSIS — R52 Pain, unspecified: Secondary | ICD-10-CM

## 2019-10-25 DIAGNOSIS — Z79899 Other long term (current) drug therapy: Secondary | ICD-10-CM

## 2019-10-25 DIAGNOSIS — M170 Bilateral primary osteoarthritis of knee: Secondary | ICD-10-CM

## 2019-10-25 MED ORDER — IBUPROFEN 800 MG PO TABS
800 mg | ORAL_TABLET | Freq: Every day | ORAL | 1 refills | Status: AC | PRN
Start: 2019-10-25 — End: ?

## 2019-10-25 NOTE — Patient Instructions
Do labs today    Osteoarthritis: Information from the [employer] of Rheumatology    Osteoarthritis is a joint disease that most often affects middle-age to elderly people. It is commonly referred to as OA or as ''wear and tear'' of the joints, but we now know that OA is a disease of the entire joint, involving the cartilage, joint lining, ligaments, and bone. Although it is more common in older people, it is not really accurate to say that the joints are just ''wearing out.''  About 27 million Americans are living with OA, the most common form of joint disease. The lifetime risk of developing OA of the knee is about 46%, and the lifetime risk of developing OA of the hip is 25%, according to the [facility], a long-term study from the [place] and sponsored by [employer] for [employer] and Prevention (often called the [employer]) and the [employer].  OA is a top cause of disability in older people. The goal of treatment in OA is to reduce pain and improve function. There is no cure for the disease, but some treatments attempt to slow disease progression.  Fast facts  OA is the most common form of joint disease, and is a leading cause of disability in elderly people.   This arthritis tends to occur in the hand joints, spine, hips, knees, and great toes.   It is characterized by breakdown of the cartilage (the tissue that cushions the ends of the bones between joints), bony changes of the joints, deterioration of tendons and ligaments, and various degrees of inflammation of the synovium (joint lining).   Though some of the joint changes are irreversible, most patients will not need joint replacement surgery.   OA symptoms (what you feel) can vary greatly among patients.   A rheumatologist can detect arthritis and prescribe the proper treatment.    What is osteoarthritis? OA is a frequently slowly progressive joint disease typically seen in middle-aged to elderly people.  The disease occurs when the joint cartilage breaks down often because of mechanical stress or biochemical alterations, causing the bone underneath to fail. OA can occur together with other types of arthritis, such as gout or rheumatoid arthritis.  OA tends to affect commonly used joints such as the hands and spine, and the weight-bearing joints such as the hips and knees.   Symptoms include:   Joint pain and stiffness   Knobby swelling at the joint   Cracking or grinding noise with joint movement   Decreased function of the joint  Who gets osteoarthritis?  OA affects people of all races and both sexes. Most often, it occurs in  patients age 92 and above. However, it can occur sooner if you have  other risk factors (things that raise the risk of getting OA).   Risk factors include:   Older age   Having family members with OA   Obesity   Joint injury or repetitive use (overuse) of joints   Joint deformity such as unequal leg length, bowlegs or knocked knees    How is osteoarthritis diagnosed?  Most often doctors detect OA based on the typical symptoms (described earlier) and on results of the physical exam. In some cases, X-rays or other imaging tests may be useful to tell the extent of disease or to help rule out other joint problems.   How is osteoarthritis treated?  There is no proven treatment yet that can reverse joint damage  from OA. The goal of treatment is to reduce pain and improve function of the affected joints. Most often, this is possible with a mixture of physical measures and drug therapy and, sometimes, surgery. Physical measures ? Weight loss and exercise are useful in OA. Excess weight puts stress on your knee joints and hips and low back. For every 10 pounds of weight you lose over 10 years, you can reduce the chance of developing knee OA by up to 50%. Exercise can improve your muscle strength, decrease joint pain and stiffness, and lower the chance of disability due to OA.    Also helpful are support (''assistive'') devices, such as braces or a walking cane, that help you do daily activities. Heat or cold therapy can help relieve OA symptoms for a short time.     Certain alternative treatments such as spa (hot tub), massage, acupuncture and chiropractic manipulation can help relieve pain for a short time. They can be costly, though, and require repeated treatments. Also, the long-term benefits of these alternative (sometimes called complementary or integrative) medicine treatments are unproven but are under study.     Drug Therapy ? Forms of drug therapy include topical, oral (by mouth) and injections (shots). You apply topical drugs directly on the skin over the affected joints. These medicines include capsaicin cream, lidocaine and diclofenac gel. Oral pain relievers such as acetaminophen are common first treatments. So are nonsteroidal anti-inflammatory drugs (often called NSAIDs), which decrease swelling and pain.     In 2010, the government (FDA) approved the use of duloxetine (Cymbalta) for chronic (long-term) musculoskeletal pain including from OA. This oral drug is not new. It also is in use for other health concerns, such as mood disorders, nerve pain and fibromyalgia.     Patients with more serious pain may need stronger medications, such as prescription narcotics. Joint injections with corticosteroids (sometimes called cortisone shots) or with a form of lubricant called hyaluronic acid can give months of pain relief from OA. This lubricant is given in the knee, and these shots may help delay the need for a knee replacement by a few years in some patients.     Surgery ? Surgical treatment becomes an option for severe cases. This includes when the joint has serious damage, or when medical treatment fails to relieve pain and you have major loss of function. Surgery may involve arthroscopy, repair of the joint done through small incisions (cuts). If the joint damage cannot be repaired, you may need a joint replacement.    Supplements ? Many over-the-counter nutrition supplements have been used for treatment of OA. Most lack good research data to support their effectiveness and safety. Among the most widely used are glucosamine/chondroitin sulfate, calcium and vitamin D, and omega-3 fatty acids. To ensure safety and avoid drug interactions, consult your doctor or pharmacist before using any of these supplements. This is especially true when you are combining these supplements with prescribed drugs.  Living with Osteoarthritis  There is no cure for OA, but you can manage how it affects your lifestyle. Some tips include:  Properly position and support your neck and back while sitting or sleeping.   Adjust furniture, such as raising a chair or toilet seat.   Avoid repeated motions of the joint, especially frequent bending.   Lose weight if you are overweight or obese, which can reduce pain and slow progression of OA.   Exercise each day.   Use arthritis support devices that will help you do daily  activities.  You might want to work with a physical therapist or occupational therapist to learn the best exercises and to choose arthritis assistive devices.   Points to remember  OA is the most common form of arthritis and can occur together with other types of arthritis. The goal of treatment in OA is to reduce pain and improve function.   Exercise is an important part of OA treatment because it can decrease joint pain and improve function.   At present, there is no treatment that can reverse the damage of OA in the joints. Researchers are trying to find ways to slow or reverse this joint damage.   The rheumatologist's role in the treatment of osteoarthritis  Rheumatologists are doctors who are experts in diagnosing and treating arthritis and other diseases of the joints, muscles and bones. You may also need to see other health care providers, for instance, physical or occupational therapists and orthopedic doctors.  For more information  The [employer] of Rheumatology has compiled this list to give you a starting point for your own additional research. The ACR does not endorse or maintain these Web sites, and is not responsible for any information or claims provided on them. It is always best to talk with your rheumatologist for more information and before making any decisions about your care.  Arthritis Foundation  ww.arthritis.[facility] of Arthritis and Musculoskeletal and Skin Diseases   www.niams.http://www.myers.net/  Rheumatology Research Foundation   Learn how the Rheumatology Research Foundation advances research and training to improve the health of people with rheumatic diseases.  www.rheumatology.org/REF  Updated February 2012   Written by Manning Charity, MD, and reviewed by the [facility] of Rheumatology Communications and Marketing Committee.       MEDICATION: IBUPROFEN (Adult)  Ibuprofen (brand name: Motrin) is an anti-inflammatory drug. It is also available over the counter in a lower strength as Advil, Motrin IB, and other brand names. It is very useful for pain, fever, and inflammation.  DIRECTIONS FOR USE:  You may take this medicine with food or milk to reduce stomach upset.  WHAT TO WATCH FOR: POSSIBLE SIDE EFFECTS: Nausea, upper abdominal pain, dizziness (Contact your doctor if these symptoms persist or become severe). Bleeding from the stomach, which may appear as blood in vomit or stool (red or black color); rapid weight gain, leg swelling or easy bruising, change in the amount of urine, confusion/mood changes, very stiff neck, yellowing of the eyes/skin (Contact your doctor or return to this facility promptly).  ALLERGIC REACTION: Rash, itching, swelling, trouble breathing or swallowing (Contact your doctor or return to this facility promptly).  MEDICAL CONDITIONS: Before starting this medicine, be sure your doctor knows if you have any of the following conditions:  ? Stomach ulcer (active or in the past), history of vomiting blood or bloody stool  ? Chronic alcoholism, or if you consume more than 3 alcoholic drinks daily  ? Allergic reaction to aspirin or other anti-inflammatory medicines  ? Asthma, nasal polyps, or angioedema; pregnancy or breastfeeding  ? Liver or kidney disease; bleeding disorder, diabetes  DRUG INTERACTION: Before starting this medicine, be sure your doctor knows if you are taking any of the following drugs:  ? Blood thinners [Coumadin (warfarin), low molecular weight heparins, heparin], water pills [Lasix (furosemide), hydrochlorothiazide, Dyazide], blood pressure medicine, diabetes pills, corticosteroids [methylprednisolone, prednisone], aspirin or other anti-inflammatory drugs, Lanoxin (digoxin), methotrexate, cyclosporine, cidofovir, antiplatelet drugs such as Pletal (cilostazol) or Plavix (clopidogrel), desmopressin, drugs for osteoporosis [Fosamax (  alendronate)], lithium, pemetrexed, probenecid, bile acid binding resins, drotrecogin, SSRI antidepressants (fluoxetine, sertraline)  WARNINGS:  ? Do not take with prednisone, other anti-inflammatory drugs, or alcohol since this increases the risk of getting a bleeding ulcer. This drug may cause dizziness. Do not drive, ride a bicycle, or operate dangerous equipment while taking this medicine until you know how it will affect you.  [NOTE: This information topic may not include all directions, precautions, medical conditions, drug/food interactions, and warnings for this drug. Check with your doctor, nurse, or pharmacist for any questions that you may have.]  ? [address], [address] [place], [place] [address]. All rights reserved. This information is not intended as a substitute for professional medical care. Always follow your healthcare professional's instructions.

## 2019-11-14 ENCOUNTER — Ambulatory Visit: Payer: MEDICAID

## 2019-11-14 NOTE — Consults
General Surgery Consult    Patient: Dustin Watts  MRN: [id number]  DOB: 04/26/1956  Date of Service: 11/14/2019    Requesting Physician: Murtis Sink., MD, *, No admitting provider for patient encounter.     Reason for Consultation: Umbilical Hernia    History of Present Illness: Dustin Watts is a 64 y.o. male with a history of HTN, Cerebral palsy, perdiabetes, epilepsy presenting for surgical evaluation of umbilical hernia.  The patient initially became aware of his hernia when he presented to an OSH for lateral abdominal pain and bruising, traumatic in nature. During the exam and workup, he was noted to have an umbilical hernia. He believes it is enlarging in size, though denies any pain, discomfort, or episodes of bowel obstruction or incarceration. He was encouraged to seek out evaluation by a surgeon from his PCP, although is hesitant to pursue repair unless medically necessary due to weight bearing restrictions after surgery and the current COVID pandemic.    He denies any prior abdominal surgeries.     Past Medical History:   Past Medical History:   Diagnosis Date   ? Acne    ? Arthritis    ? Cerebral palsy (HCC/RAF)    ? Eczema    ? Hypertension    ? Impaired glucose tolerance 03/23/2016    HGBA1C of 5.7 on 03/19/2016    ? Low vitamin D level 05/07/2016    05/07/2016 - taking antiepiletpic which may exacerbate it, started on ergo 50000, will recheck level in about 2 months    ? Overweight    ? Seizure (HCC/RAF) 1973    chronically on Dilantin, no seizure in 25 years, followed by Neurologist (Dr. Wiliam Ke) at [facility]   ? Shortening of arm, congenital     right arm       Past Surgical History:   Past Surgical History:   Procedure Laterality Date   ? rectal abscess  2010    surgery done at [facility]       Scheduled Meds:   Current Outpatient Medications   Medication Sig   ? ASPIRIN LOW DOSE 81 MG EC tablet TAKE 1 TABLET BY MOUTH EVERY DAY ? benzoyl peroxide-erythromycin 5-3% gel Apply to acne once daily..   ? Calcium Carbonate-Vitamin D (OYSTER SHELL CALCIUM + VITAMIN D) 500-200 mg-unit TABS tablet TAKE 1 TABLET BY MOUTH 3 TIMES A DAY   ? ibuprofen 800 mg tablet Take 1 tablet (800 mg total) by mouth daily as needed.   ? RETIN-A MICRO PUMP 0.04 % gel Apply topically at bedtime.   ? sulfacetamide-sulfur 9-4.5 % LIQD liquid Apply to face twice daily.   ? Tretinoin Microsphere (RETIN-A MICRO PUMP) 0.06 % GEL Apply pea sized amount to face nightly.   ? ATORVASTATIN 40 mg tablet TAKE 1 TABLET BY MOUTH EVERY DAY (Patient not taking: Reported on 07/27/2019)   ? oxyCODONE 5 mg tablet Take 1 tablet (5 mg total) by mouth two (2) times daily as needed. Max Daily Amount: 10 mg (Patient not taking: Reported on 11/14/2019.)     No current facility-administered medications for this visit.        Allergies: Topiramate, Sulfa antibiotics, Amlodipine, Carvedilol, Losartan, and Penicillins    Family History:   Family History   Problem Relation Age of Onset   ? Heart disease Mother    ? Diabetes Mother    ? Kidney disease Mother    ? Stroke Brother    ?  Colon cancer Neg Hx    ? Prostate cancer Neg Hx    ? Ovarian cancer Neg Hx    ? Breast cancer Neg Hx    ? Hyperlipidemia Neg Hx    ? Hypertension Neg Hx        Social History:   Social History     Socioeconomic History   ? Marital status: Married     Spouse name: Not on file   ? Number of children: 0   ? Years of education: Not on file   ? Highest education level: Not on file   Occupational History   ? 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 Financial resource strain: Not on file   ? Food insecurity     Worry: Not on file     Inability: Not on file   ? Transportation needs     Medical: Not on file     Non-medical: Not on file   Tobacco Use   ? Smoking status: Never Smoker   ? Smokeless tobacco: Never Used   Substance and Sexual Activity   ? Alcohol use: No   ? Drug use: No ? Sexual activity: Not Currently     Partners: Female     Comment: never married (incorrectly documented in Epic)   Lifestyle   ? Physical activity     Days per week: Not on file     Minutes per session: Not on file   ? Stress: Not on file   Relationships   ? Social Wellsite geologist on phone: Not on file     Gets together: Not on file     Attends religious service: Not on file     Active member of club or organization: Not on file     Attends meetings of clubs or organizations: Not on file     Relationship status: Not on file   Other Topics Concern   ? Do you exercise at least a day, 3 or more days a week? No   ? Types of Exercise? (List in Comments) Yes     Comment: biking infrequently   ? Do you follow a special diet? No   ? Vegan? No   ? Vegetarian? No   ? Pescatarian? Not Asked   ? Lactose Free? Not Asked   ? Gluten Free? No   ? Omnivore? Yes   Social History Narrative   ? Not on file       Review of Systems: A 14 point review of systems was reviewed with the patient. In addition to the pertinent positives noted above. The remainder of the review of systems is negative.    Physical Exam:  Last Recorded Vital Signs:    11/14/19 1422   BP: 154/91   Pulse: (!) 100   Temp: 36.1 ?C (97 ?F)     Body mass index is 29.29 kg/m?Marland Kitchen   Vitals:    11/14/19 1422   Weight: 216 lb (98 kg)   Height: 6' (1.829 m)       Vitals:    11/14/19 1422   Weight: 216 lb (98 kg)   Height: 6' (1.829 m)       General: The pateint is a well-appearing, well-nourished, in no acute distress.The patient is awake, alert, oriented, and answers questions appropriately.   Head: Normocephalic, atraumatic.   Eyes: Extraocular movements are intact. There is no scleral  icterus.   Nose: Patient is wearing a mask.  Neck: Trachea Midline  Heart: Regular rate and rhythm   Chest:  Clear bilaterally.  There are no adventitious breath sounds.  Abdomen:  Flat, soft.  No scars.  No tenderness. Nonreducible 2cm hernia at umbilicus Genitalia and Rectal: deferred.  Extremities:  No clubbing, cyanosis, or edema.    Laboratory Review:  Lab Results   Component Value Date    WBC 5.46 07/27/2019    HGB 13.8 07/27/2019    HCT 41.1 07/27/2019    MCV 98.8 (H) 07/27/2019    PLT 173 07/27/2019       Imaging Review: No imaging has been resulted in the last 30 days    Diagnosis:   1. Umbilical hernia     Assessment:  Dustin Watts is a 64 y.o. male who presents for surgical evaluation for umbilical hernia. The patient is asymptomatic, and is unsure if he would like it repaired at this time. We discussed without any signs or symptoms of incarceration or bowel strangulation, there was no urgent indication for surgery and it was up to him to repair for symptom relief.     Plan:  - expectant management  - Follow up in 1 yr, may follow up sooner as symptoms arise    Patient seen and examined by and plan developed with the attending physician, Dr. Mordecai Maes      Author:   Ace Gins. Loy Little, MD   11/14/2019 3:01 PM  General Surgery     I spent 45 minutes face-to-face with the patient, care coordination, reviewing imaging, pertinent lab findings, and medical decision making. Over half in the discussion of the diagnosis and the discussed risks and benefits of surgery and alternative options.

## 2020-01-01 ENCOUNTER — Telehealth: Payer: BLUE CROSS/BLUE SHIELD

## 2020-01-01 NOTE — Telephone Encounter
When patient last saw you he said the wait to get  Labs drawn was too long, he is scheduled to see you 01/23/20 at 8.00am, is it okay fir him to get labs drawn 30 minutes before he sees you, or do you prefer he gets labs done locally at [facility] one week before hand

## 2020-01-01 NOTE — Telephone Encounter
Ok to be drawn 30 min before (I can follow them later on) Thanks J

## 2020-01-02 NOTE — Telephone Encounter
Message relayed to patient.

## 2020-01-22 NOTE — Progress Notes
RHEUMATOLOGY OUTPATIENT CONSULTATION  PATIENT: Dustin Watts MRN: [id number] DOB: Sep 27, 1956  REASONS FOR VISIT/CHIEF COMPLAINT:OA      HPI:63 y.o. male former pt of Dr Roselee Nova with generalized OA, OK knees here to establish care.  Hx of epilepsy, pre-diabetic, HTN, HLD, vit D deficiency      Current visit patient reports the followings:-    10/2019  - doing well ibuprofen may be 3 times a week left knee bothers walking not bad ''ibuprofen before doing a lot of walking'' no stomach issue aware not to take daily  - lost about 3 lbs doing squat daily 200s and bike      07/2019  - staying home due to covid  - L knee is not bad; occasional small pain with walking ''do I have fluid in my knee''  - Ibuprofen 800 mg; tried not to take too many; 4-5 times a month or so could be more e.g before going out/walking    03/2019  - L knee doing OK; last injection pain after the procedure lasted 1.5 month; now fine; walking fine  - L ankle strain earlier this year not recall splint  - Ibuprofen 800 mg prn BID but not need them; taking oxycodone as needed on average once at night for the chronic LBP    09/2018  - overall good no falls; L knee some swelling and worsening pain wondering if take fluid out or if any fluid inside  - ibuprofen as needed 800 mg 2 times a day only prn for years no problem with food no indigestion  - otherwise doing well      11/2017   -last visit increasing knee pain XR R knee good L knee mild OA   - doing well; no seizure any longer  - joints are not bad; no pain with walking; no need to take ibuprofen regularly; takes infrequently- a day before and on day out e.g going to [place]; requesting refill; no problem no GERD/taking with food    08/2017  -last visit 02/2017 joints are doing fine on prn NSAID/post CVA  -knees are doing not too badly ''only after long walk'' ''described as blown up'', keen to have refill ibuprofen 800 mg PRN given by Dr Roselee Nova in past ''I only take it if I have a long walk to do so taking infrequently''; no stiffness; at times puffy not today; slight worse lately with pain   -other joints are fine  -chronic R sided atrophy since borne Dx cerebral palsy otherwise in good health  -no seizure for some time now      No known history of malar rash, photosensitivity rash, discoid lesions, oral/nasal ulcers,  pleuritic chest pain, seizures/psychosis, Raynaud's, skin tightness, dry eyes/mouth, hair loss or blood clots.  No history of muscle pain or proximal myopathy.  No history for fever, weight loss, loss of appetite, night sweat or lymphadenopathy.  No history of skin psoriasis, eye issues, STDs or IBD.    No recent travelling.  No sick contacts.    Prior medication/Joint injections/Immune suppressant therapy:  None    ROS:  A 14 point ROS was obtained and significant for what is mentioned in HPI and below.    ROS    (Check if Normal, or note + findings):   []    reviewed questionnaire on 01/21/2020    []   Not Obtainable :   [x]    Constit : [-] fever, [-] weight loss    [x]    Skin :[-]  vasculitic palpable rash, [-] tender nodules, [-]scaling plaques      [x]    Eyes : [-] photophobia, [-] red eye [-] gritty eyes    [x]    CV :[-] LE swelling, orthopnea, [-] PND,    []    MS :     [x]    ENT: [-] ear /nose swelling, [-] throat pain or post nasal d/c   [x]    GI :[-] constipation [-] diarrhea  [-] melena [-]heartburn,   [x]    Heme/Lymph :[-] easy bruising or bleeding      [x]   Resp : [-] DOE, [-] wheezing [-] pleurisy   [x]    GU:[-]dysruria, [-] gross hematuria   []    Neuro :[-] focal neurological [-] wrist or foot drop     [x]    Imm/All :[-] recurrent infections in past, [-] pollen or seasonal allergy   [x]   Endo :   [x]    Psych: [-] mood, [-] memory     PMHx:  Past Medical History:   Diagnosis Date   ? Acne    ? Arthritis    ? Cerebral palsy (HCC/RAF)    ? Eczema    ? Hypertension    ? Impaired glucose tolerance 03/23/2016    HGBA1C of 5.7 on 03/19/2016    ? Low vitamin D level 05/07/2016    05/07/2016 - taking antiepiletpic which may exacerbate it, started on ergo 50000, will recheck level in about 2 months    ? Overweight    ? Seizure (HCC/RAF) 1973    chronically on Dilantin, no seizure in 25 years, followed by Neurologist (Dr. Wiliam Ke) at [facility]   ? Shortening of arm, congenital     right arm       PSHx:  Past Surgical History:   Procedure Laterality Date   ? rectal abscess  2010    surgery done at [facility] the [facility]       Meds:    No outpatient medications have been marked as taking for the 01/23/20 encounter (Appointment) with Liana Gerold, MD, MS.       Allergies:  Allergies   Allergen Reactions   ? Topiramate      Eye swelling   ? Sulfa Antibiotics Angioedema     No anaphylaxis   ? Amlodipine Other (See Comments)     Lip swelling without anaphylaxis   ? Carvedilol Angioedema     Lip swelling without anaphylaxis   ? Losartan Rash     No hive, no anaphylaxis, unclear if truly has rash from this medicine   ? Penicillins Rash     ''bumps on body''       SocHx:  Social History     Tobacco Use   ? Smoking status: Never Smoker   ? Smokeless tobacco: Never Used   Substance Use Topics   ? Alcohol use: No     Works in Loss adjuster, chartered; single    FamHx:  Family History   Problem Relation Age of Onset   ? Heart disease Mother    ? Diabetes Mother    ? Kidney disease Mother    ? Stroke Brother    ? Colon cancer Neg Hx    ? Prostate cancer Neg Hx    ? Ovarian cancer Neg Hx    ? Breast cancer Neg Hx    ? Hyperlipidemia Neg Hx    ? Hypertension Neg Hx        Physical Exam:  BP 132/88  ~  Pulse 78  ~ Temp 35.8 ?C (96.4 ?F) (Skin)  ~ Resp 18  ~ Ht 6' (1.829 m)  ~ Wt 213 lb (96.6 kg)  ~ SpO2 97%  ~ BMI 28.89 kg/m?       BP 144/89  ~ Pulse 77  ~ Temp 36.6 ?C (97.8 ?F) (Forehead)  ~ Resp 17  ~ Ht 6' (1.829 m) Comment: pt. self reported height ~ Wt 216 lb (98 kg)  ~ SpO2 97% Comment: Room air ~ BMI 29.29 kg/m?   Pain Information     No pain information on file          BP 143/85  ~ Pulse 78  ~ Temp 36.6 ?C (97.8 ?F) (Oral)  ~ Resp 18  ~ Ht 6' (1.829 m) Comment: Pt. self reported height ~ Wt 211 lb (95.7 kg)  ~ SpO2 97% Comment: Room air ~ BMI 28.62 kg/m?   Pain Information     No pain information on file            BP 134/83  ~ Pulse 70  ~ Temp 36.4 ?C (97.5 ?F) (Oral)  ~ Resp 18  ~ Ht 6' (1.829 m)  ~ Wt 209 lb (94.8 kg)  ~ SpO2 97%  ~ BMI 28.35 kg/m?      BP 128/72  ~ Pulse 70  ~ Temp 36.4 ?C (97.5 ?F) (Oral)  ~ Resp 16  ~ Ht 6' (1.829 m)  ~ Wt 205 lb (93 kg)  ~ SpO2 97%  ~ BMI 27.80 kg/m?     Wt Readings from Last 3 Encounters:   11/14/19 216 lb (98 kg)   10/25/19 213 lb (96.6 kg)   07/27/19 214 lb (97.1 kg)     BP Readings from Last 3 Encounters:   11/14/19 154/91   10/25/19 132/88   07/27/19 131/84       NAD  No ocular injection  No alopecia  OP clear  No LAD  CTAB  RRR  S/NT/ND  No C/C/E  No rashes  No telangectasia/discoloration/calcinosis/sclerodactyly  No tender joints  No swollen joints  L knee crepitus+ no effusion  No enthesitis  No dactylitis  No myofascial pain  Neuro grossly intact except R extremities atrophy  Peripheral pulses intact      RIGHT         LEFT   TMJ [] []   ~ [] []  TMJ   Shoulder [] []  ~ [] []  Shoulder                Left box: Tender   Elbow [] []  ~ [] []  Elbow               Right box: Swollen   Wrist [] []  ~ [] []  Wrist   CMC1 [] []  ~ [] []  CMC1       MCP5 MCP4 MCP3 MCP2 MCP1         ~ MCP1 MCP2 MCP3 MCP4 MCP5       [] []  [] []  [] []  [] []  [] []   [] []  [] []  [] []  [] []  [] []        PIP5 PIP4 PIP3 PIP2 IP1 ~ IP1 PIP2 PIP3 PIP4 PIP5       [] []  [] []  [] []  [] []  [] []   [] []  [] []  [] []  [] []  [] []        DIP5 DIP4 DIP3 DIP2  ~  DIP2 DIP3 DIP4 DIP5       [] []  [] []  [] []  [] []     [] []  [] []  [] []  [] []       Sacroiliac []  ~             []   Sacroiliac                Hip  [] []  ~        [] []  Hip                  Knee  [] []  ~        [] []  Knee        Ankle    [] []  ~        [] []  Ankle           MTP5 MTP4 MTP3 MTP2 MTP1 ~ MTP1 MTP2 MTP3 MTP4 MCP5       [] []  [] []  [] []  [] []  [] []   [] []  [] []  [] []  [] []  [] []        Toe5 Toe4 Toe3 Toe2 Toe1 ~ Toe1 Toe2 Toe3 Toe4 Toe5        [] []  [] []  [] []  [] []  [] []   [] []  [] []  [] []  [] []  [] []                      Labs:  Lab Results   Component Value Date    WBC 5.46 07/27/2019    HGB 13.8 07/27/2019    HCT 41.1 07/27/2019    PLT 173 07/27/2019    NEUTABS 3.13 07/27/2019    LYMPHABS 1.70 07/27/2019     Lab Results   Component Value Date    GLUCOSE 101 (H) 07/27/2019    GFREST >89 04/18/2014    CREAT 0.59 (L) 07/27/2019    CALCIUM 8.8 07/27/2019    TOTPRO 7.2 07/27/2019    ALBUMIN 4.6 07/27/2019    AST 18 07/27/2019    ALT 18 07/27/2019    ALKPHOS 100 07/27/2019     @RRCOAGS @  Lab Results   Component Value Date    TSH 1.5 01/21/2017       Rheum labs:  Lab Results   Component Value Date    SRWEST 13 (H) 05/27/2017    SRWEST 15 (H) 11/28/2014     No results found for: CARDLIPIGA, CARDLIPIGG, CARDLIPIGM, ANAAB, ANAABTITER, B2GLYPROIGA, B2GLYPROIGG, B2GLYPROIGM, DRVVT, RNPAB, SMAB, SSA  No results found for: C3, C4, DSDNAAB, NDNAABIFA  No results found for: Lyman Bishop, MPOAB  Lab Results   Component Value Date    CKTOT 68 04/23/2008    URICACID 3.6 11/28/2014     Lab Results   Component Value Date    VITD25OH 37 08/05/2018    VITD25OH 45 01/10/2018     UA:   Lab Results   Component Value Date    PROTCLUR Trace 04/23/2008    BLDUR Negative 04/23/2008    LEUKESTUR Negative 04/23/2008    RBCSUR 5 04/23/2008    WBCSUR 3 04/23/2008       Studies: [x] reviewed [] ordered [] independently reviewed.  Component      Latest Ref Rng & Units 09/16/2018   Segmented Neutrophil      No Reference Range % 1   Lymphocyte      No Reference Range % 1   Monocyte      No Reference Range % 98   Total Cells      cells 100   Fluid Appearance       Viscoid . . .   RBC Count,Joint Fl      No Reference Range /cmm <1,000   WBC Count,Joint Fl      No Reference Range /cmm 282   Crystals,Fluid #1      No Crystals Seen No Crystals Seen  Imaging:    Knees XR 08/2017   Mild osteoarthritis of the left knee.  No significant arthritis of the right knee.    Knees 2007 normal     ASSESSMENT AND PLAN  Dustin Watts is a 64 y.o. male     Knees OA stable on prn prn ibuprofen 800 mg as needed; no GERD; pt takes rarely; recent slight worsening with effusion in L knee s/p CSI improved currently no significant effusion stable on PRN NSAID  Hx of epilepsy stable   pre-diabetic, HTN, HLD  Hx vit D deficiency WNL 02/2017  overweight    RECOMMENDATIONS  - ibuprofen 800 mg as PRN daily for knee pain/sied effects discussed rarely need  - labs NSAID toxicity labs today  - weight reduction OA info  - no need L knee aspiration CSI this visit will continue to monitor no need for now  - continue to loose weight  - explain in detail and all questions answered    Orders Placed This Encounter   ? CBC & Auto Differential   ? Comprehensive Metabolic Panel   ? ibuprofen 800 mg tablet           Orders Placed This Encounter   ? CBC & Auto Differential   ? Comprehensive Metabolic Panel   ? ibuprofen 800 mg tablet         Orders Placed This Encounter   ? Inject/Drain Knee   ? CBC & Auto Differential   ? Comprehensive Metabolic Panel   ? Crystals,Fluid   ? Joint Fluid Cell Count & Differential   ? ibuprofen 800 mg tablet           RTC  3 months or sooner as needed.  CC note to PCP/referred physician        Orders Placed This Encounter   ? ibuprofen 800 mg tablet           IMMUNIZATION STATUS  Immunization History   Administered Date(s) Administered   ? Tdap 03/08/2016   ? influenza vaccine IM quadrivalent (Fluarix Quad) (PF) SYR (66 months of age and older) 08/04/2017, 09/08/2018   ? influenza vaccine IM quadrivalent (Fluzone Quad) (PF) SYR/SDV (31 months of age and older) 08/06/2016   ? influenza, unspecified formulation 08/06/2016, 08/04/2017     Thank you for allowing me to participate in this patient's care.  If I can be of further service, please contact my office at  [phone].  [x]  >50%  of this * minute visit was spent in face-to-face evaluation and counseling the patient.  The above plan of care, diagnosis, orders, and follow-up were discussed with the patient. Questions related to this recommended plan of care were answered  []  Immunosuppression drugs changed. [x]  Risks/benefits/toxicities of meds discussed.   []  Reviewed and summarized old records. []  Requested outside medical records.  []  Discussed with other providers of complex medical conditions and management plans outlined above    Liana Gerold MD Rheumatology Division, [place] [phone])

## 2020-01-23 ENCOUNTER — Ambulatory Visit: Payer: MEDICAID

## 2020-01-24 MED ORDER — ASPIRIN LOW DOSE 81 MG PO TBEC
ORAL_TABLET | 3 refills | Status: AC
Start: 2020-01-24 — End: ?

## 2020-02-01 ENCOUNTER — Ambulatory Visit: Payer: MEDICAID

## 2020-02-08 ENCOUNTER — Ambulatory Visit: Payer: MEDICARE

## 2020-02-08 DIAGNOSIS — Z23 Encounter for immunization: Secondary | ICD-10-CM

## 2020-02-20 ENCOUNTER — Ambulatory Visit: Payer: BLUE CROSS/BLUE SHIELD

## 2020-02-22 ENCOUNTER — Telehealth: Payer: Medicaid HMO

## 2020-02-22 ENCOUNTER — Ambulatory Visit: Payer: BLUE CROSS/BLUE SHIELD

## 2020-02-22 NOTE — Telephone Encounter
Spoke with patient, he stated he did not want to go into the walk in clinic. He said at the moment, his toe is red and he believed he has a little pocket of blood on top however, he wants to take the weekend to see if it gets better or worst and depending on that, he will call us Monday to make an appointment or not. MD's cell phone number was provided to pt.    Thanks,  Merla Riches.

## 2020-02-22 NOTE — Telephone Encounter
Forwarded by: Flor Houdeshell

## 2020-02-22 NOTE — Telephone Encounter
Call Back Request    MD: Dr. Vic Blackbird     Reason for call back: Pt called asking to speak with Dr. Vic Blackbird as pt states he injured his toe (right foot) yesterday and he woke up with it being red today.     Pt states that Dr. Vic Blackbird previously provided the pt with his cell phone number and pt states he misplaced MD phone number and would like it again if possible.     Any Symptoms:  []  Yes  [x]  No      ? If yes, what symptoms are you experiencing:    o Duration of symptoms (how long):    o Have you taken medication for symptoms (OTC or Rx):      Patient or caller has been notified of the 24-48 hour turnaround time.

## 2020-03-13 ENCOUNTER — Telehealth: Payer: MEDICAID

## 2020-03-13 NOTE — Telephone Encounter
Message to Practice/Provider    MD: Otelia Limes    Message: Patient wants to apologize that he had to cancel the appointment for 03/14/20. He does have an appointment scheduled in November and will see her then. He wishes Dr. Otelia Limes a Happy Mother's Day.     Also wants to inform her that his insruance will be sending her a new  request for retin-a prescription.    Return call is not being requested by the patient or caller.    Patient or caller has been notified of the 24-48 hour processing turnaround time if applicable.

## 2020-03-14 ENCOUNTER — Ambulatory Visit: Payer: BLUE CROSS/BLUE SHIELD

## 2020-03-26 NOTE — Progress Notes
RHEUMATOLOGY OUTPATIENT CONSULTATION  PATIENT: Dustin Watts MRN: [id number] DOB: Sep 26, 1956  REASONS FOR VISIT/CHIEF COMPLAINT:OA      HPI:64 y.o. male former pt of Dr Roselee Nova with generalized OA, OK knees here to establish care.  Hx of epilepsy, pre-diabetic, HTN, HLD, vit D deficiency      Current visit patient reports the followings:-    10/2019  - doing well ibuprofen may be 3 times a week left knee bothers walking not bad ''ibuprofen before doing a lot of walking'' no stomach issue aware not to take daily  - lost about 3 lbs doing squat daily 200s and bike      07/2019  - staying home due to covid  - L knee is not bad; occasional small pain with walking ''do I have fluid in my knee''  - Ibuprofen 800 mg; tried not to take too many; 4-5 times a month or so could be more e.g before going out/walking    03/2019  - L knee doing OK; last injection pain after the procedure lasted 1.5 month; now fine; walking fine  - L ankle strain earlier this year not recall splint  - Ibuprofen 800 mg prn BID but not need them; taking oxycodone as needed on average once at night for the chronic LBP    09/2018  - overall good no falls; L knee some swelling and worsening pain wondering if take fluid out or if any fluid inside  - ibuprofen as needed 800 mg 2 times a day only prn for years no problem with food no indigestion  - otherwise doing well      11/2017   -last visit increasing knee pain XR R knee good L knee mild OA   - doing well; no seizure any longer  - joints are not bad; no pain with walking; no need to take ibuprofen regularly; takes infrequently- a day before and on day out e[place]g going [place]; requesting refill; no problem no GERD/taking with food    08/2017  -last visit 02/2017 joints are doing fine on prn NSAID/post CVA  -knees are doing not too badly ''only after long walk'' ''described as blown up'', keen to have refill ibuprofen 800 mg PRN given by Dr Roselee Nova in past ''I only take it if I have a long walk to do so taking infrequently''; no stiffness; at times puffy not today; slight worse lately with pain   -other joints are fine  -chronic R sided atrophy since borne Dx cerebral palsy otherwise in good health  -no seizure for some time now      No known history of malar rash, photosensitivity rash, discoid lesions, oral/nasal ulcers,  pleuritic chest pain, seizures/psychosis, Raynaud's, skin tightness, dry eyes/mouth, hair loss or blood clots.  No history of muscle pain or proximal myopathy.  No history for fever, weight loss, loss of appetite, night sweat or lymphadenopathy.  No history of skin psoriasis, eye issues, STDs or IBD.    No recent travelling.  No sick contacts.    Prior medication/Joint injections/Immune suppressant therapy:  None    ROS:  A 14 point ROS was obtained and significant for what is mentioned in HPI and below.    ROS    (Check if Normal, or note + findings):   []    reviewed questionnaire on 03/25/2020    []   Not Obtainable :   [x]    Constit : [-] fever, [-] weight loss    [x]    Skin :[-]  vasculitic palpable rash, [-] tender nodules, [-]scaling plaques      [x]    Eyes : [-] photophobia, [-] red eye [-] gritty eyes    [x]    CV :[-] LE swelling, orthopnea, [-] PND,    []    MS :     [x]    ENT: [-] ear /nose swelling, [-] throat pain or post nasal d/c   [x]    GI :[-] constipation [-] diarrhea  [-] melena [-]heartburn,   [x]    Heme/Lymph :[-] easy bruising or bleeding      [x]   Resp : [-] DOE, [-] wheezing [-] pleurisy   [x]    GU:[-]dysruria, [-] gross hematuria   []    Neuro :[-] focal neurological [-] wrist or foot drop     [x]    Imm/All :[-] recurrent infections in past, [-] pollen or seasonal allergy   [x]   Endo :   [x]    Psych: [-] mood, [-] memory     PMHx:  Past Medical History:   Diagnosis Date   ??? Acne    ??? Arthritis    ??? Cerebral palsy (HCC/RAF)    ??? Eczema    ??? Hypertension    ??? Impaired glucose tolerance 03/23/2016    HGBA1C of 5.7 on 03/19/2016    ??? Low vitamin D level 05/07/2016    05/07/2016 - taking antiepiletpic which may exacerbate it, started on ergo 50000, will recheck level in about 2 months    ??? Overweight    ??? Seizure (HCC/RAF) 1973    chronically on Dilantin, no seizure in 25 years, followed by Neurologist (Dr. Wiliam Ke) [facility]   ??? Shortening of arm, congenital     right arm       PSHx:  Past Surgical History:   Procedure Laterality Date   ??? rectal abscess  2010    surgery done at [facility]       Meds:    No outpatient medications have been marked as taking for the 03/27/20 encounter (Appointment) with Liana Gerold, MD, MS.       Allergies:  Allergies   Allergen Reactions   ??? Topiramate      Eye swelling   ??? Sulfa Antibiotics Angioedema     No anaphylaxis   ??? Amlodipine Other (See Comments)     Lip swelling without anaphylaxis   ??? Carvedilol Angioedema     Lip swelling without anaphylaxis   ??? Losartan Rash     No hive, no anaphylaxis, unclear if truly has rash from this medicine   ??? Penicillins Rash     ''bumps on body''       SocHx:  Social History     Tobacco Use   ??? Smoking status: Never Smoker   ??? Smokeless tobacco: Never Used   Substance Use Topics   ??? Alcohol use: No     Works in Loss adjuster, chartered; single    FamHx:  Family History   Problem Relation Age of Onset   ??? Heart disease Mother    ??? Diabetes Mother    ??? Kidney disease Mother    ??? Stroke Brother    ??? Colon cancer Neg Hx    ??? Prostate cancer Neg Hx    ??? Ovarian cancer Neg Hx    ??? Breast cancer Neg Hx    ??? Hyperlipidemia Neg Hx    ??? Hypertension Neg Hx        Physical Exam:  BP 132/88  ~  Pulse 78  ~ Temp 35.8 ???C (96.4 ???F) (Skin)  ~ Resp 18  ~ Ht 6' (1.829 m)  ~ Wt 213 lb (96.6 kg)  ~ SpO2 97%  ~ BMI 28.89 kg/m???       BP 144/89  ~ Pulse 77  ~ Temp 36.6 ???C (97.8 ???F) (Forehead)  ~ Resp 17  ~ Ht 6' (1.829 m) Comment: pt. self reported height ~ Wt 216 lb (98 kg)  ~ SpO2 97% Comment: Room air ~ BMI 29.29 kg/m???   Pain Information     No pain information on file          BP 143/85  ~ Pulse 78  ~ Temp 36.6 ???C (97.8 ???F) (Oral)  ~ Resp 18  ~ Ht 6' (1.829 m) Comment: Pt. self reported height ~ Wt 211 lb (95.7 kg)  ~ SpO2 97% Comment: Room air ~ BMI 28.62 kg/m???   Pain Information     No pain information on file            BP 134/83  ~ Pulse 70  ~ Temp 36.4 ???C (97.5 ???F) (Oral)  ~ Resp 18  ~ Ht 6' (1.829 m)  ~ Wt 209 lb (94.8 kg)  ~ SpO2 97%  ~ BMI 28.35 kg/m???      BP 128/72  ~ Pulse 70  ~ Temp 36.4 ???C (97.5 ???F) (Oral)  ~ Resp 16  ~ Ht 6' (1.829 m)  ~ Wt 205 lb (93 kg)  ~ SpO2 97%  ~ BMI 27.80 kg/m???     Wt Readings from Last 3 Encounters:   11/14/19 216 lb (98 kg)   10/25/19 213 lb (96.6 kg)   07/27/19 214 lb (97.1 kg)     BP Readings from Last 3 Encounters:   11/14/19 154/91   10/25/19 132/88   07/27/19 131/84       NAD  No ocular injection  No alopecia  OP clear  No LAD  CTAB  RRR  S/NT/ND  No C/C/E  No rashes  No telangectasia/discoloration/calcinosis/sclerodactyly  No tender joints  No swollen joints  L knee crepitus+ no effusion  No enthesitis  No dactylitis  No myofascial pain  Neuro grossly intact except R extremities atrophy  Peripheral pulses intact      RIGHT         LEFT   TMJ [] []   ~ [] []  TMJ   Shoulder [] []  ~ [] []  Shoulder                Left box: Tender   Elbow [] []  ~ [] []  Elbow               Right box: Swollen   Wrist [] []  ~ [] []  Wrist   CMC1 [] []  ~ [] []  CMC1       MCP5 MCP4 MCP3 MCP2 MCP1         ~ MCP1 MCP2 MCP3 MCP4 MCP5       [] []  [] []  [] []  [] []  [] []   [] []  [] []  [] []  [] []  [] []        PIP5 PIP4 PIP3 PIP2 IP1 ~ IP1 PIP2 PIP3 PIP4 PIP5       [] []  [] []  [] []  [] []  [] []   [] []  [] []  [] []  [] []  [] []        DIP5 DIP4 DIP3 DIP2  ~  DIP2 DIP3 DIP4 DIP5       [] []  [] []  [] []  [] []     [] []  [] []  [] []  [] []       Sacroiliac []  ~             []   Sacroiliac                Hip  [] []  ~        [] []  Hip                  Knee  [] []  ~        [] []  Knee        Ankle    [] []  ~        [] []  Ankle           MTP5 MTP4 MTP3 MTP2 MTP1 ~ MTP1 MTP2 MTP3 MTP4 MCP5       [] []  [] []  [] []  [] []  [] []   [] []  [] []  [] []  [] []  [] []        Toe5 Toe4 Toe3 Toe2 Toe1 ~ Toe1 Toe2 Toe3 Toe4 Toe5        [] []  [] []  [] []  [] []  [] []   [] []  [] []  [] []  [] []  [] []                      Labs:  Lab Results   Component Value Date    WBC 5.46 07/27/2019    HGB 13.8 07/27/2019    HCT 41.1 07/27/2019    PLT 173 07/27/2019    NEUTABS 3.13 07/27/2019    LYMPHABS 1.70 07/27/2019     Lab Results   Component Value Date    GLUCOSE 101 (H) 07/27/2019    GFREST >89 04/18/2014    CREAT 0.59 (L) 07/27/2019    CALCIUM 8.8 07/27/2019    TOTPRO 7.2 07/27/2019    ALBUMIN 4.6 07/27/2019    AST 18 07/27/2019    ALT 18 07/27/2019    ALKPHOS 100 07/27/2019     @RRCOAGS @  Lab Results   Component Value Date    TSH 1.5 01/21/2017       Rheum labs:  Lab Results   Component Value Date    SRWEST 13 (H) 05/27/2017    SRWEST 15 (H) 11/28/2014     No results found for: CARDLIPIGA, CARDLIPIGG, CARDLIPIGM, ANAAB, ANAABTITER, B2GLYPROIGA, B2GLYPROIGG, B2GLYPROIGM, DRVVT, RNPAB, SMAB, SSA  No results found for: C3, C4, DSDNAAB, NDNAABIFA  No results found for: Lyman Bishop, MPOAB  Lab Results   Component Value Date    CKTOT 68 04/23/2008    URICACID 3.6 11/28/2014     Lab Results   Component Value Date    VITD25OH 37 08/05/2018    VITD25OH 45 01/10/2018     UA:   Lab Results   Component Value Date    PROTCLUR Trace 04/23/2008    BLDUR Negative 04/23/2008    LEUKESTUR Negative 04/23/2008    RBCSUR 5 04/23/2008    WBCSUR 3 04/23/2008       Studies: [x] reviewed [] ordered [] independently reviewed.  Component      Latest Ref Rng & Units 09/16/2018   Segmented Neutrophil      No Reference Range % 1   Lymphocyte      No Reference Range % 1   Monocyte      No Reference Range % 98   Total Cells      cells 100   Fluid Appearance       Viscoid . . .   RBC Count,Joint Fl      No Reference Range /cmm <1,000   WBC Count,Joint Fl      No Reference Range /cmm 282   Crystals,Fluid #1      No Crystals Seen No Crystals Seen  Imaging:    Knees XR 08/2017   Mild osteoarthritis of the left knee.  No significant arthritis of the right knee.    Knees 2007 normal     ASSESSMENT AND PLAN  KIMARION CHERY is a 64 y.o. male     Knees OA stable on prn prn ibuprofen 800 mg as needed; no GERD; pt takes rarely; recent slight worsening with effusion in L knee s/p CSI improved currently no significant effusion stable on PRN NSAID  Hx of epilepsy stable   pre-diabetic, HTN, HLD  Hx vit D deficiency WNL 02/2017  overweight    RECOMMENDATIONS  - ibuprofen 800 mg as PRN daily for knee pain/sied effects discussed rarely need  - labs NSAID toxicity labs today  - weight reduction OA info  - no need L knee aspiration CSI this visit will continue to monitor no need for now  - continue to loose weight  - explain in detail and all questions answered    Orders Placed This Encounter   ??? CBC & Auto Differential   ??? Comprehensive Metabolic Panel   ??? ibuprofen 800 mg tablet           Orders Placed This Encounter   ??? CBC & Auto Differential   ??? Comprehensive Metabolic Panel   ??? ibuprofen 800 mg tablet         Orders Placed This Encounter   ??? Inject/Drain Knee   ??? CBC & Auto Differential   ??? Comprehensive Metabolic Panel   ??? Crystals,Fluid   ??? Joint Fluid Cell Count & Differential   ??? ibuprofen 800 mg tablet           RTC  3 months or sooner as needed.  CC note to PCP/referred physician        Orders Placed This Encounter   ??? ibuprofen 800 mg tablet           IMMUNIZATION STATUS  Immunization History   Administered Date(s) Administered   ??? Tdap 03/08/2016   ??? influenza vaccine IM quadrivalent (Fluarix Quad) (PF) SYR (60 months of age and older) 08/04/2017, 09/08/2018   ??? influenza vaccine IM quadrivalent (Fluzone Quad) (PF) SYR/SDV (52 months of age and older) 08/06/2016   ??? influenza, unspecified formulation 08/06/2016, 08/04/2017     Thank you for allowing me to participate in this patient's care.  If I can be of further service, please contact my office at  [phone].  [x]  >50%  of this * minute visit was spent in face-to-face evaluation and counseling the patient.  The above plan of care, diagnosis, orders, and follow-up were discussed with the patient. Questions related to this recommended plan of care were answered  []  Immunosuppression drugs changed. [x]  Risks/benefits/toxicities of meds discussed.   []  Reviewed and summarized old records. []  Requested outside medical records.  []  Discussed with other providers of complex medical conditions and management plans outlined above    Liana Gerold MD Rheumatology Division, [place] [phone])

## 2020-03-27 ENCOUNTER — Ambulatory Visit: Payer: MEDICARE

## 2020-04-11 MED ORDER — IBUPROFEN 800 MG PO TABS
800 mg | ORAL_TABLET | Freq: Every day | ORAL | 1 refills | PRN
Start: 2020-04-11 — End: ?

## 2020-04-13 MED ORDER — IBUPROFEN 800 MG PO TABS
800 mg | ORAL_TABLET | Freq: Every day | ORAL | 0 refills | Status: AC | PRN
Start: 2020-04-13 — End: ?

## 2020-06-13 ENCOUNTER — Telehealth: Payer: MEDICAID

## 2020-06-14 NOTE — Telephone Encounter
He's a long time patient, go ahead and do the PA. He hasn't had any success with other retinoids and can't tolerate any of the other ones.

## 2020-06-21 NOTE — Telephone Encounter
PA was submitted through cover my meds for   Retin-A Micro pump 0.06% gel     KEY: [id number]  PA CASE ID: [id number]    Approved today   CaseId:[id number];Status:Approved;Review Type:Prior Auth;Coverage Start Date:05/22/2020;Coverage End Date:06/21/2021;  I will call patient to inform

## 2020-06-28 NOTE — Telephone Encounter
Rosann Auerbach has approved Retin-a micro pump 0.06% gel w/pump  This request has been approved from 05/22/2020 until 06/21/2021    Case ID : [id number]

## 2020-08-07 ENCOUNTER — Telehealth: Payer: BLUE CROSS/BLUE SHIELD

## 2020-08-07 NOTE — Telephone Encounter
Left pt a voicemail in regards to his 11/9 appt.  Dr. Otelia Limes will not be available. Per last conversation with pt he informed me he can only do morning appts.  I was able to accommodate pt for 12/2 at 9:15am.  I went ahead and scheduled pt so his appointment is not scheduled any further out.      PCC: if pt calls back you can transfer call to back line so I can assist pt  Thank you

## 2020-08-19 ENCOUNTER — Telehealth: Payer: Medicaid HMO

## 2020-08-19 ENCOUNTER — Telehealth: Payer: MEDICARE

## 2020-08-19 NOTE — Telephone Encounter
Spoke with pt on the phone.  Slipped twice and injured his feet over the summer.  Last week fell after tripping on the cement, landed on his hand, seen in ER, told to f/u with PCP.  Made appointment with the patient later this week

## 2020-08-19 NOTE — Telephone Encounter
Forwarded by: Roberts Gaudy    Hello Dr. Vic Blackbird,    The patient fell, went to there ER, and has a dislocated finger. He would like to be seen Thursday or Friday (mornings are preferred).    Please advise.    Thank you,    Dustin Watts

## 2020-08-19 NOTE — Telephone Encounter
Appointment Accommodation Request  Pt was seen at ER over the weekend outside of [facility] and advised to F/U with PCP    Appointment Type:Hospital F/u    Reason for sooner request:no appts with 2 weeks    Date/Time Requested (If any): Thursday / Fridays    Last seen by MD:     Any Symptoms:  []  Yes  [x]  No      ? If yes, what symptoms are you experiencing:   o Duration of symptoms (how long):     Patient or caller was offered an appointment but declined.    Patient or caller was advised to seek emergency services if conditions are urgent or emergent.    Patient has been notified of the 24-48 hour turnaround time.

## 2020-08-23 ENCOUNTER — Ambulatory Visit: Payer: MEDICAID

## 2020-08-23 DIAGNOSIS — Z23 Encounter for immunization: Secondary | ICD-10-CM

## 2020-08-23 DIAGNOSIS — R2241 Localized swelling, mass and lump, right lower limb: Secondary | ICD-10-CM

## 2020-08-23 NOTE — Progress Notes
PATIENT: Dustin Watts  MRN: [id number]  DOB: 1956/10/19  DATE OF SERVICE: 08/23/2020    CHIEF COMPLAINT:   Chief Complaint   Patient presents with   ??? Finger Injury     L ring finger        History of Present Illness  Dustin Watts is a 64 y.o. male presents for:     Finger pain  - 4th finger of L hand  - slipped and fell, landed on hand  - seen at ER  - dislocated, was reduced and put in finger splint  - pain is minimal  - had laceration, started on clindamycin for prophylaxis    Lump  - R foot, top  - since tripping and injuring foot months ago  - no pain but can irritate him  - has stopped resolving, is now persistent  - no rash    ROS  No weakness, no  numnbess, no bleeding    Past Medical History:   Diagnosis Date   ??? Acne    ??? Arthritis    ??? Cerebral palsy (HCC/RAF)    ??? Eczema    ??? Hypertension    ??? Impaired glucose tolerance 03/23/2016    HGBA1C of 5.7 on 03/19/2016    ??? Low vitamin D level 05/07/2016    05/07/2016 - taking antiepiletpic which may exacerbate it, started on ergo 50000, will recheck level in about 2 months    ??? Overweight    ??? Seizure (HCC/RAF) 1973    chronically on Dilantin, no seizure in 25 years, followed by Neurologist (Dr. Wiliam Ke) at [facility]   ??? Shortening of arm, congenital     right arm       Medications that the patient states to be currently taking   Medication Sig   ??? ASPIRIN LOW DOSE 81 MG EC tablet TAKE 1 TABLET BY MOUTH EVERY DAY   ??? ATORVASTATIN 40 mg tablet TAKE 1 TABLET BY MOUTH EVERY DAY   ??? benzoyl peroxide-erythromycin 5-3% gel Apply to acne once daily.Marland Kitchen   ??? Calcium Carbonate-Vitamin D (OYSTER SHELL CALCIUM + VITAMIN D) 500-200 mg-unit TABS tablet TAKE 1 TABLET BY MOUTH 3 TIMES A DAY   ??? clindamycin 300 mg capsule TAKE 2 CAPSULES ORALLY EVERY 8 HOURS   ??? DILANTIN 100 MG ER capsule TAKE 4 CAPSULES ORALLY AT BEDTIME   ??? IBUPROFEN 800 mg tablet TAKE 1 TABLET (800 MG TOTAL) BY MOUTH DAILY AS NEEDED.   ??? oxyCODONE 5 mg tablet Take 1 tablet (5 mg total) by mouth two (2) times daily as needed. Max Daily Amount: 10 mg   ??? RETIN-A MICRO PUMP 0.04 % gel Apply topically at bedtime.   ??? sulfacetamide-sulfur 9-4.5 % LIQD liquid Apply to face twice daily.   ??? Tretinoin Microsphere (RETIN-A MICRO PUMP) 0.06 % GEL Apply pea sized amount to face nightly.       Physical Exam  Last Recorded Vital Signs:    08/23/20 0808   BP: 123/77   Pulse: 84   Resp: 18   Temp: 36.5 ???C (97.7 ???F)   SpO2: 96%     Wt Readings from Last 3 Encounters:   08/23/20 222 lb (100.7 kg)   11/14/19 216 lb (98 kg)   10/25/19 213 lb (96.6 kg)     Body mass index is 30.11 kg/m???.  System Check if normal Positive or additional negative findings   Constit  [x]  General appearance     Eyes  []   Conj/Lids []  Pupils  []  Fundi     HENMT  []  External ears/nose []  TMs (bilateral)   []  Gross Hearing []  Nasal mucosa   []  Lips/teeth/gums []  Oropharynx    []  Mucus membranes []  Head     Neck  []  Inspection/palpation []  Thyroid     Resp  [x]  Effort []  No wheezing    []  Auscultation  []  No crackles     CV  []  Rhythm/rate []  Murmurs []  LEE    []  JVP non-elevated []  Radial pulses     Breast  []  Inspection []  Palpation     GI  []  No abd masses  []  No tenderness   []  No rebound/guarding  []  Liver/spleen      GU  []  Scrotum []  Penis []  Prostate      Lymph  Normal: []  Neck []  Axillae []  Groin     MSK site examined:    []  Inspect/palp []  ROM []  Strength/tone   4th finger of L hand with mild tenderness at the PIPs, otherwise no edema and no rash, no hemorrhage, no discharge     Skin  []  Inspection []  Palpation  Site:  5cm soft mobile lump on the upper R foot without erythema or tenderness   Neuro  []  CN2-12 intact grossly   [x]  Alert and oriented   [x]  Muscle strength - legs     []  Sensation  []  Gait/balance     Psych  []  Insight/judgement  []  Mood/affect        I have:   [x]  Reviewed/ordered []  1 []  2 [x]  ? 3 unique laboratory, radiology, and/or diagnostic tests noted below       Lab Studies:  Lab Results   Component Value Date    NA 140 07/27/2019    K 4.1 07/27/2019    CL 104 07/27/2019    CO2 24 07/27/2019    BUN 9 07/27/2019    CREAT 0.59 (L) 07/27/2019    GLUCOSE 101 (H) 07/27/2019    CALCIUM 8.8 07/27/2019    ALT 18 07/27/2019    AST 18 07/27/2019    ALKPHOS 100 07/27/2019    BILITOT 0.3 07/27/2019     Lab Results   Component Value Date    WBC 5.46 07/27/2019    HGB 13.8 07/27/2019    HCT 41.1 07/27/2019    MCV 98.8 (H) 07/27/2019    PLT 173 07/27/2019     Lab Results   Component Value Date    HGBA1C 5.8 (H) 04/14/2019       Assessment and Plan       ICD-10-CM    1. Sublux of proximal interphaln joint of unsp finger, sequela  S63.239S    2. Need for immunization against influenza  Z23 Influenza vaccine IM;   PF   3. Skin lump of leg, right  R22.41 [place] lower extremity lump or bump non-vascular right       Finger subluxation - appears to be healing well, continue with splint for 1-2 weeks until pain fully resolves, no indication to conitnue the clindamyin so he can stop, f/u if pain persists      Problem   Skin Lump of Leg, Right    08/23/2020 - 5cm soft tissue nodule/lump of unclear etiology, by palpation it appears to be a lipoma but it appeared after trauma to the leg so perhaps it is a seroma.  Will get [place] for further eval.  The above recommendation were discussed with the patient.  The patient has all questions answered satisfactorily and is in agreement with this recommended plan of care.    Return in about 4 weeks (around 09/20/2020) for annual exam.     Author:  Lendon Collar. Tiara Bartoli 08/24/2020 11:34 AM  Time of note filed does not necessarily reflect the time of encounter.  Portions of this note may have been created with voice recognition software. Occasional wrong-word or ''sound-alike'' substitutions may have occurred due to the inherent limitations of voice recognition software. Please read the chart carefully and recognize, using context, where these substitutions have occurred.  16109 - I spent 30-39 minutes (established patient). When time (rather than medical decision making) is used for determination of Level of Service for this encounter, the time includes any time spent reviewing the chart on the day of the visit, time spent face to face with the patient, time spent documenting the visit on the day of the visit, and may include counseling and coordination of care also performed on the day of the visit.

## 2020-08-24 DIAGNOSIS — R2241 Localized swelling, mass and lump, right lower limb: Secondary | ICD-10-CM

## 2020-08-24 DIAGNOSIS — T792XXA Traumatic secondary and recurrent hemorrhage and seroma, initial encounter: Secondary | ICD-10-CM

## 2020-08-24 DIAGNOSIS — S63249D Subluxation of distal interphalangeal joint of unspecified finger, subsequent encounter: Secondary | ICD-10-CM

## 2020-09-02 ENCOUNTER — Inpatient Hospital Stay: Payer: MEDICAID

## 2020-09-02 ENCOUNTER — Ambulatory Visit: Payer: BLUE CROSS/BLUE SHIELD

## 2020-09-02 ENCOUNTER — Inpatient Hospital Stay: Payer: BLUE CROSS/BLUE SHIELD

## 2020-09-02 DIAGNOSIS — S62629A Displaced fracture of medial phalanx of unspecified finger, initial encounter for closed fracture: Secondary | ICD-10-CM

## 2020-09-02 DIAGNOSIS — Z719 Counseling, unspecified: Secondary | ICD-10-CM

## 2020-09-02 DIAGNOSIS — S63279A Dislocation of unspecified interphalangeal joint of unspecified finger, initial encounter: Secondary | ICD-10-CM

## 2020-09-02 NOTE — Consults
[facility]  Division of Sports Medicine    SPORTS MEDICINE CONSULTATION        Date: 09/02/2020    Referred by:  Self-referral  Unknown  [place],  [place] [address]  Primary Care Physician:  Murtis Sink., MD, MPH    HISTORY:     Dear Dr. Conan Bowens,    I had the pleasure of seeing Dustin Watts today in consultation in sports medicine. Dustin Watts is a 64 y.o. male who is presenting with  L 4th digit injury x 18 days. Pt is L hand dominant.    On 08/15/20 pt was carrying his sister's bags out of getting out of the car and tripped on the curb. He fell on his outstretched L hand. He had L 4th digit deformity and pain immediately after the fall. He went to the ER and was found to have L 4th digit dislocation. Finger was reduced and placed in finger splint. He has been wearing the splint all the time. He had a L 4th digit laceration and was prescribed clindamycin in the ED for prophylaxis. He completed the course of clindamycin. His pain has been overall well controlled. He was advised to take ibuprofen PRN but has not taken it because he was nervous it would exacerbate his abd hernia. He denies h/o stomach ulcers. He is not currently working. For exercise he does squats, yoga, and bikes.    Past Medical History: I have reviewed and confirmed the past medical history in the chart.  Past Medical History:   Diagnosis Date   ??? Acne    ??? Arthritis    ??? Cerebral palsy (HCC/RAF)    ??? Eczema    ??? Hypertension    ??? Impaired glucose tolerance 03/23/2016    HGBA1C of 5.7 on 03/19/2016    ??? Low vitamin D level 05/07/2016    05/07/2016 - taking antiepiletpic which may exacerbate it, started on ergo 50000, will recheck level in about 2 months    ??? Overweight    ??? Seizure (HCC/RAF) 1973    chronically on Dilantin, no seizure in 25 years, followed by Neurologist (Dr. Wiliam Ke) at [facility]   ??? Shortening of arm, congenital     right arm     Past Surgical History: I have reviewed and confirmed the past surgical history in the chart.  Past Surgical History:   Procedure Laterality Date   ??? rectal abscess  2010    surgery done at [facility] the [facility]     Medications: Reviewed medication list in the chart  Outpatient Medications Prior to Visit   Medication Sig   ??? ASPIRIN LOW DOSE 81 MG EC tablet TAKE 1 TABLET BY MOUTH EVERY DAY   ??? ATORVASTATIN 40 mg tablet TAKE 1 TABLET BY MOUTH EVERY DAY   ??? benzoyl peroxide-erythromycin 5-3% gel Apply to acne once daily.Marland Kitchen   ??? Calcium Carbonate-Vitamin D (OYSTER SHELL CALCIUM + VITAMIN D) 500-200 mg-unit TABS tablet TAKE 1 TABLET BY MOUTH 3 TIMES A DAY   ??? clindamycin 300 mg capsule TAKE 2 CAPSULES ORALLY EVERY 8 HOURS   ??? DILANTIN 100 MG ER capsule TAKE 4 CAPSULES ORALLY AT BEDTIME   ??? IBUPROFEN 800 mg tablet TAKE 1 TABLET (800 MG TOTAL) BY MOUTH DAILY AS NEEDED.   ??? oxyCODONE 5 mg tablet Take 1 tablet (5 mg total) by mouth two (2) times daily as needed. Max Daily Amount: 10 mg   ??? RETIN-A  MICRO PUMP 0.04 % gel Apply topically at bedtime.   ??? sulfacetamide-sulfur 9-4.5 % LIQD liquid Apply to face twice daily.   ??? Tretinoin Microsphere (RETIN-A MICRO PUMP) 0.06 % GEL Apply pea sized amount to face nightly.     No facility-administered medications prior to visit.      Allergies: Reviewed allergy section in the chart  Allergies   Allergen Reactions   ??? Topiramate      Eye swelling   ??? Sulfa Antibiotics Angioedema     No anaphylaxis   ??? Amlodipine Other (See Comments)     Lip swelling without anaphylaxis   ??? Carvedilol Angioedema     Lip swelling without anaphylaxis   ??? Losartan Rash     No hive, no anaphylaxis, unclear if truly has rash from this medicine   ??? Penicillins Rash     ''bumps on body''     Review of Systems: A 14-point review of systems was performed.  With the exception of the HPI, all other review of systems was negative.     PHYSICAL EXAM:     Vitals: There were no vitals taken for this visit.  General: NAD, pleasant & cooperative  Head: EOMI, no facial lesions    A left hand and wrist exam was performed.    Exam Performed: out of splint/cast.    Inspection:   Skin: significant ecchymosis over palmar aspect of 4th digit, healed abrasion over proximal palmar aspect of 4th digit, no erythema or warmth  Swelling: significant swelling of 4th digit  Deformity: none  Muscle atrophy: none    Palpation:   Warmth: no warmth  Masses: none  Effusion: none  Tenderness: severe over the palmar and dorsal aspects of the 4th digit    ROM: extension and flexion of 4th digit significantly limited by pain  Strength: strength of 4th digit limited by pain    Neurological Exam: normal sensation   Vascular Exam: good capillary refill and radial pulse present    Psych: Alert, appropriate affect    IMAGING:     *** (***):  ***    ASSESSMENT:     Dustin Watts is a 64 y.o. male who presents with L 4th digit pain. History, examination, and imaging are concerning for closed dislocation of L 4th digit interphalangeal joint and closed avulsion fracture of L 4th digit middle phalanx. Buddy tapping of L 3rd and 4th digits performed today. Recommend OT for strengthening and ROM exercises. Will follow up in 1 month.     RECOMMENDATIONS & PLAN:     Given our clinical suspicion of the above diagnosis, our initial plan is as follows:    Diagnoses and all orders for this visit:    Closed dislocation of interphalangeal joint of left hand, initial encounter  -     XR finger pa+lat+obl left (3 views); Future  -     Referral to Rehabilitation, Occupational Therapy    Closed avulsion fracture of middle phalanx of finger, initial encounter  -     Referral to Rehabilitation, Occupational Therapy        Follow-Up: Return in about 1 month (around 10/03/2020).    Once again it was a pleasure to see Dustin Watts today in consultation. Thank you for allowing me to participate in their care.  Please feel free to contact me if you have additional questions or if I can be of any further assistance. ---------------------------------------------------------------     The patient was seen and  discussed with Sports Medicine Attending, Dr. Elonda Husky, who agrees with the above unless otherwise noted in the addendum.     Georgina Quint, MD  [facility] Family Medicine PGY-2      I reviewed the case and I concur with the findings and plan of care that we developed   as outlined in the progress note by the resident/fellow.    ***

## 2020-09-06 ENCOUNTER — Telehealth: Payer: Medicaid HMO

## 2020-09-06 NOTE — Telephone Encounter
Call Back Request      Reason for call back: patient was seen in the beginning of October and asked to return in 1 month   Patient seeking a morning appointment around 11am  Please assist in accomodataion    Any Symptoms:  []  Yes  [x]  No      ? If yes, what symptoms are you experiencing:    o Duration of symptoms (how long):    o Have you taken medication for symptoms (OTC or Rx):      Patient or caller has been notified of the 24-48 hour turnaround time.

## 2020-09-09 NOTE — Telephone Encounter
Spoke with patient and booked appt

## 2020-09-16 ENCOUNTER — Ambulatory Visit: Payer: MEDICAID

## 2020-09-16 DIAGNOSIS — S63279A Dislocation of unspecified interphalangeal joint of unspecified finger, initial encounter: Secondary | ICD-10-CM

## 2020-09-16 DIAGNOSIS — S62629A Displaced fracture of medial phalanx of unspecified finger, initial encounter for closed fracture: Secondary | ICD-10-CM

## 2020-09-16 NOTE — Progress Notes
OCCUPATIONAL THERAPY EVALUATION     PATIENT: Dustin Watts  GENDER: male  AGE: 64 y.o.  DOB: 1956/10/06  MRN: [id number]    REFERRING PHYSICIAN: Cherly Hensen., MD    DATE OF ONSET: 08/15/20    DIAGNOSIS:    ICD-10-CM    1. Closed dislocation of interphalangeal joint of finger of left hand, initial encounter  S63.279A    2. Avulsion fracture of middle phalanx of finger, closed, initial encounter  S62.629A          Subjective:   CHIEF COMPLAINT:  Chief Complaint   Patient presents with   ??? Pain   ??? Weakness   ??? Stiffness   ??? Functional Decline       PATIENT'S GOAL: To preserve joint alignment of digit.       HISTORY OF PRESENT ILLNESS / INJURY: Patient is a 64 year old left hand dominant male who fell at curb while helping to carry groceries on 08/15/20. Fall resulted in left 4th digit proximal interphalangeal joint dislocation and avulsion fracture of base of middle phalanx. Treated with buddy taping. However, patient reports taping increases discomfort level. Patient has not been performing active range of motion exercises of digit and is concerned the involved digit is ''crooked''. Patient with limited function of right upper extremity due to cerebral palsy and is used to using left upper extremity for most daily activities.     PRIOR INTERVENTIONS:  None    BARRIERS TO LEARNING:  Are there any cultural or religious beliefs limiting treatment?: No      Are there any barriers to learning?: No     Are there any special communication needs?: No             History:   PROBLEM SUMMARY LIST:   Patient Active Problem List   Diagnosis   ??? Acne   ??? Osteoarthritis of both knees   ??? Overweight   ??? White coat syndrome without hypertension   ??? Epilepsy (HCC/RAF)   ??? Prediabetes   ??? Low vitamin D level   ??? Elevated cholesterol with elevated triglycerides   ??? Drug allergy   ??? Umbilical hernia   ??? Gall bladder stones   ??? Plantar wart of left foot   ??? Abnormal brain scan   ??? Allergic conjunctivitis   ??? Varicose veins of both lower extremities   ??? Stress due to family tension   ??? Mild anemia   ??? Leg edema, left   ??? Skin lump of leg, right   ??? Closed dislocation of interphalangeal joint of finger of left hand, initial encounter   ??? Avulsion fracture of middle phalanx of finger, closed, initial encounter       SURGICAL HISTORY:  Past Surgical History:   Procedure Laterality Date   ??? rectal abscess  2010    surgery done at [facility] the [facility]        PERTINENT DIAGNOSTIC TESTS:       X-ray IMPRESSION (09/02/20):  ???  A small nondisplaced cortical fracture is noted about the base of the middle phalanx consistent with a volar plate fracture.  This is well-corticated and likely subacute to chronic.  There is mild soft tissue swelling. The alignment is within normal limits.    PRECAUTIONS:  None    RECENT INFECTIONS:  No    SUICIDE RISK:  No    PAIN:   Location: left 4th finger  Duration: Intermittent  Description: Aching and Shooting  Current  Pain: 0/10  Pain at Best: 0/10  Pain at Worst: 10/10  Aggravating Factors: holding objects, wearing buddy tape, cooking tasks, night time  Alleviating Factors: Heat and Rest        OCCUPATION: Self employed, and chef    LIVING SITUATION:  Lives with another/others who can provide assistance    FALLS ASSESSMENT:  Have you fallen in the last 12 months?: Yes  If so, how many times?: 1      Did the fall(s) result in an injury?: Yes     Follow-up Required?: Yes  Follow-up comments:: Currently in OT          PATIENT OWNED EQUIPMENT:  none    Objective:     Occupational Therapy Hand Evaluation    Prior Level of Function:  Full use of left upper extremity for all activities without limitation. Independent to modified independent with activities of daily living and instrumental activities of daily living.    Current Level of Function:  Difficulty performing self care, tying shoes, unable to cook/clean and grasp/hold objects     Hand dominance:  [x]  Left  []  Right []  Ambidextrous    Affected extremity:  [x]  Left  []  Right []  Bilateral    Range Of Motion:  Finger active range of motion (in degrees): - indicates lack of extension, + indicates hyperextension      Ring  Metacarpal phalangeal (MCP) joint extension/flexion Right Not assessed  Left 0-15   Proximal interphalangeal (PIP) joint extension/flexion Right Not assessed  Left -15-62 (take from dorsal aspect of joint)   Distal interphalangeal (DIP) joint extension/flexion Right Not assessed  Left 0-20       Strength Measurements:  Grip in pounds (lbs) - 2nd rung position:   Right: Not assessed (function of right upper extremity  Limited due to cerebral palsy)   Left: Deferred due to patient's current condition        Sensation:  Grossly intact per patient     Edema in Centimeters (cm):    Proximal phalanx Right Not assessed  Left 7.1   Proximal interphalangeal (PIP) joint Right Not assessed  Left 7.8   Middle phalanx Right Not assessed  Left 6.7       Palpation:  Tenderness at 4th proximal interphalangeal digit volar plate      Outcome Measures:  Quick Disabilities of the Arm Shoulder and Hand questionnaire % of disability: 95.45.  Maximal Disability Score 100%.  Minimal Disability Score 0%      Problem List:  Decreased range of motion, Decreased strength, Increased edema and Increased pain  Decreased knowledge regarding ergonomics / positioning, Decreased knowledge regarding joint protection, Decreased tolerance for keyboard / computer use, Difficulty managing handles / door knobs / keys / faucets, Limitations in ability to complete dressing related tasks, Limitations in ability to handle groceries, Limitations in ability to perform driving related tasks, Limitations in ability to perform fine motor activities associated with dressing, Limitations in ability to perform household tasks , Limitations in handling utensils, Limitations in meal preparation, Limitations in opening jars / bottles and Unable to reach all areas during bathing / hygiene tasks     Goals:  Short Term Goal:  Patient will learn and implement at least three techniques to decrease finger swelling for ease in grasping objects in 2 sessions.  Patient will increase left 4th metacarpophalangeal joint active flexion by at least 30 degrees for ease in performing dressing tasks in 6 weeks.   Patient will sleep throughout  night with no more than 1-2/10 pain in 6 weeks.     Long Term Goal:  Patient will demonstrate at least 20-25 pounds of grip strength for ease in opening jars/bottles in 12 weeks.  Patient will perform all self care with modified independence - independence in 12 weeks.   Patient will sleep sans finger pain in 12 weeks.     ASSESSMENT:  64 year old male presents with referral diagnosis of Closed dislocation of interphalangeal joint of finger of left hand, initial encounter  Avulsion fracture of middle phalanx of finger, closed, initial encounter since 08/15/20.    Signs and symptoms consistent with diagnosis. Moderate swelling at proximal interphalangeal joint and tenderness at volar plate. Limited range of motion in all joints of left 4th digit and patient guarded in flexion involved digits as well. Functional limitations of right upper extremity due to cerebral palsy as well as left digit injury restrict patient's ability to perform basic self care tasks. Patient assured that light, pain free movement and use of left hand with buddy straps is okay at this point.      Symptoms are in the subacute phase with moderate irritability.     The patient's primary impairment(s) of left finger include decreased range of motion, decreased grip and pinch strength, increased pain, increased inflammation, decreased dexterity and decreased coordination which is limiting the ability to perform self care, dress, sleep, perform meal preparation, complete work activities, complete fitness/wellness routine, perform household tasks and participate in hobbies. Refer to problem list.     The patient requires skilled Occupational Therapy that can be safely and effective performed only by a qualified therapist to address the above deficits.    Billey Wojciak J Terrez Ander, OT              REHAB POTENTIAL: Good    OBSTACLES TO REHABILITATION/CO-MORBIDITIES THAT MAY AFFECT GOALS AND TREATMENT PLAN: Functional limitations of right upper extremity due to cerebral palsy    Plan:   INTERVENTIONS:   Therapeutic Exercise, Manual Therapy, Neuromuscular Re-education, Therapeutic Activities, Ultrasound, Traction, Electrical Stimulation, Iontophoresis, Paraffin, Heat Therapy, Cold Therapy, Patient/Caregiver Education, Self Care Training and Light Therapy, [phone]     Patient to be seen 1-2 times per week for 12 week(s).    Patient agrees with goals and treatment plan as outlined above:Yes    TREATMENT TODAY:  Medicare:    Visit number: 1 (next report due visit 10)    Plan of Care Expires: 12/09/2020  Authorization expires:  Medical necessity     Pain:No    SUBJECTIVE: See initial eval for details.    OBJECTIVE: Evaluation completed.    -Retrograde massage of 4th digit performed and added to home program  -Digi sleeve xx large provided for left ring finger. Good circulation was verified via blanching finger tip.  Patient instructed to monitor skin for good circulation and to remove is there is any skin discoloration, pain, or numbness.   -velcro buddy straps issued to patient to wear with 3rd digit as buddy taping was increasing pain level. Patient instructed to keep interphalangeal joints free for unrestricted movement of 4th/3rd digits with buddy straps on     Home exercise program incorporating below exercises reviewed with patient, who demonstrated good understanding and review.     Access Code: [email or web address] URL: [email or web address]  Seated Finger DIP Flexion AROM with Blocking - 2-3 x daily - 7 x weekly - 2 sets - 10 reps   Finger PIP Flexion Extension with Blocking -  2-3 x daily - 7 x weekly - 2 sets - 10 reps   Seated Finger Composite Flexion Extension - 2-3 x daily - 7 x weekly - 2 sets - 10 reps   Seated Finger Composite Flexion Stretch - 2-3 x daily - 7 x weekly - 2 sets - 10 reps      ASSESSMENT: . Moderate swelling at proximal interphalangeal joint and tenderness at volar plate. Limited range of motion in all joints of left 4th digit and patient guarded in flexion involved digits as well. Functional limitations of right upper extremity due to cerebral palsy as well as left digit injury restrict patient's ability to perform basic self care tasks. Patient assured that light, pain free movement and use of left hand with buddy straps is okay at this point. Patient tolerated all therapeutic interventions well today and demonstrated/verbalized understanding of all instructions. Patient agreed to be compliant with home exercise program.       PLAN: follow up on home exercise program and buddy straps, progress active range of motion of left digits, swelling management    Patient was seen for a total of 20 minutes of treatment time.   60 minutes was in services represented by timed CPT codes.    Leveon Pelzer J Ritchie Klee, OT

## 2020-09-17 ENCOUNTER — Ambulatory Visit: Payer: Medicaid HMO

## 2020-09-20 ENCOUNTER — Ambulatory Visit: Payer: MEDICAID

## 2020-09-20 NOTE — Progress Notes
PATIENT: Dustin Watts  MRN: [id number]  DOB: Oct 25, 1956  DATE OF SERVICE: 09/20/2020    CHIEF COMPLAINT:   Chief Complaint   Patient presents with   ??? Follow-up     finger injury        History of Present Illness  Dustin Watts is a 64 y.o. male presents for:     Finger dislocation/fracture  - Left hand 4th PIP   - pain is improving  - seeing OT  - seen by sports medicine  - advised to buddy tape for 3 weeks, has been counseled on home exercise    Heel pain  - L heel  - from the fall  - wrapping with ace wrap  - no long periods of standing    ROS  No fever, no cough    Past Medical History:   Diagnosis Date   ??? Acne    ??? Arthritis    ??? Cerebral palsy (HCC/RAF)    ??? Eczema    ??? Hypertension    ??? Impaired glucose tolerance 03/23/2016    HGBA1C of 5.7 on 03/19/2016    ??? Low vitamin D level 05/07/2016    05/07/2016 - taking antiepiletpic which may exacerbate it, started on ergo 50000, will recheck level in about 2 months    ??? Overweight    ??? Seizure (HCC/RAF) 1973    chronically on Dilantin, no seizure in 25 years, followed by Neurologist (Dr. Wiliam Ke) at [facility]   ??? Shortening of arm, congenital     right arm       Medications that the patient states to be currently taking   Medication Sig   ??? ASPIRIN LOW DOSE 81 MG EC tablet TAKE 1 TABLET BY MOUTH EVERY DAY   ??? ATORVASTATIN 40 mg tablet TAKE 1 TABLET BY MOUTH EVERY DAY   ??? Calcium Carbonate-Vitamin D (OYSTER SHELL CALCIUM + VITAMIN D) 500-200 mg-unit TABS tablet TAKE 1 TABLET BY MOUTH 3 TIMES A DAY   ??? clindamycin 300 mg capsule TAKE 2 CAPSULES ORALLY EVERY 8 HOURS   ??? IBUPROFEN 800 mg tablet TAKE 1 TABLET (800 MG TOTAL) BY MOUTH DAILY AS NEEDED.   ??? oxyCODONE 5 mg tablet Take 1 tablet (5 mg total) by mouth two (2) times daily as needed. Max Daily Amount: 10 mg   ??? RETIN-A MICRO PUMP 0.04 % gel Apply topically at bedtime.   ??? Tretinoin Microsphere (RETIN-A MICRO PUMP) 0.06 % GEL Apply pea sized amount to face nightly.       Physical Exam  Last Recorded Vital Signs:    09/20/20 1019   BP: 145/82   Pulse: (!) 100   Resp: 18   Temp: 36.8 ???C (98.3 ???F)   SpO2: 97%     Wt Readings from Last 3 Encounters:   08/23/20 222 lb (100.7 kg)   11/14/19 216 lb (98 kg)   10/25/19 213 lb (96.6 kg)     Body mass index is 30.11 kg/m???.  System Check if normal Positive or additional negative findings   Constit  [x]  General appearance     Eyes  []  Conj/Lids []  Pupils  []  Fundi     HENMT  []  External ears/nose []  TMs (bilateral)   []  Gross Hearing []  Nasal mucosa   []  Lips/teeth/gums []  Oropharynx    []  Mucus membranes []  Head     Neck  []  Inspection/palpation []  Thyroid     Resp  [x]  Effort []  No wheezing    []   Auscultation  []  No crackles     CV  []  Rhythm/rate []  Murmurs []  LEE    []  JVP non-elevated []  Radial pulses     Breast  []  Inspection []  Palpation     GI  []  No abd masses  []  No tenderness   []  No rebound/guarding  []  Liver/spleen      GU  []  Scrotum []  Penis []  Prostate      Lymph  Normal: []  Neck []  Axillae []  Groin     MSK site examined:    []  Inspect/palp []  ROM []  Strength/tone     Left hand 4th PIP with mild tenderness and no edema, ROM limited by pain   Skin  []  Inspection []  Palpation  Site:     Neuro  []  CN2-12 intact grossly   []  Alert and oriented   []  Muscle strength - legs     []  Sensation  []  Gait/balance     Psych  []  Insight/judgement  []  Mood/affect          Assessment and Plan       ICD-10-CM    1. Closed dislocation of interphalangeal joint of finger of left hand, initial encounter  Z61.096E            Problem   Closed Dislocation of Interphalangeal Joint of Finger of Left Hand, Initial Encounter    09/20/2020 - seeing OT, wearing brace, to f/u with ortho, continue supportive care for now.           The above recommendation were discussed with the patient.  The patient has all questions answered satisfactorily and is in agreement with this recommended plan of care.    Return if symptoms worsen or fail to improve.     Author:  Lendon Collar. Nahdia Doucet 09/20/2020 10:27 AM  Time of note filed does not necessarily reflect the time of encounter.  Portions of this note may have been created with voice recognition software. Occasional wrong-word or ''sound-alike'' substitutions may have occurred due to the inherent limitations of voice recognition software. Please read the chart carefully and recognize, using context, where these substitutions have occurred.

## 2020-09-26 ENCOUNTER — Ambulatory Visit: Payer: MEDICAID

## 2020-09-26 NOTE — Treatment Summary
OCCUPATIONAL THERAPY TREATMENT NOTE    PATIENT: Dustin Watts  GENDER: male  AGE: 64 y.o.  DOB: 1956/07/23  MRN: [id number]    Diagnosis:     ICD-10-CM    1. Closed dislocation of interphalangeal joint of finger of left hand, initial encounter  S63.279A    2. Avulsion fracture of middle phalanx of finger, closed, initial encounter  S62.629A      Medicare:    Visit number: 2 (next report due visit 10)    Plan of Care Expires: 12/09/2020  Authorization expires:  Medical necessity     Precautions: none    Pain:Yes   Pain In: 6/10  Pain Out: 5-6/10  Location: left ring and middle fingers  Duration: Variable, occasional  Description: Sharp and Tingling, pulsating    SUBJECTIVE: patient reports doing his exercises, theres pain with flexion. Patient is asking for new straps and digi sleeve because they are dirty    OBJECTIVE: encouraged patient to wash and clean his items as needed as the clinic cannot distribute new ones simply because they are dirty. Digi sleeve however, was over stretched and not providing compression, new one provided to patient.  Updated active range of motion measurements, patient very concerned about having his finger be able to be straight. Gentle extension stretching, iterated that discomfort can be expected due to patient's stiffness but not pain when doing his exercises. Fabricated custom left static finger extension orthosis (L3933) x 1 in order to decrease deformity. Patient instructed to monitor skin for any areas of irritation, recommended wear schedule, proper donning and doffing of orthosis, and care of orthosis, Patient provided hand out for written instructions as well. Patient demonstrated good understanding of the wear and care of the orthosis and displayed good fit in orthosis post fabrication.    Precautions:  Instructed patient to wear orthosis the first time for 20 minutes.  Take it off and check for any of the following:  ??? Redness that persists more than 20 minutes  ??? Increased swelling  ??? Increased pain  ??? Areas of pressure  ??? Blisters  ??? Excessive stiffness, pain, or numbness  ??? If any of the above occurs, notify therapist as soon as possible for further instruction.  Wearing Schedule:  If no problems were present after wearing or using the orthosis, then continue to wear or use for:  Night time   Care of orthosis:  ??? Keep orthoses away from open flames, heat, water heaters, or prolonged sunlight (such as in a hot, closed car).  Excessive heat may cause your orthosis to change shape.  ??? Keep orthosis away from pets.  ??? If you are able to take orthosis off for a period of time, it can be cleaned:  - Clean with cool soapy water and scrub it with a small brush.  - Rub the inside with alcohol to reduce odor.    Patient asked for a second orthosis in case that one gets dirty. Again iterated that the clinic cannot dispense 2 for that purpose and reviewed how to clean his orthosis. Paperwork provided to patient for reference. Encouraged use of heat to make it easier to move. Reviewed blocking exercises.      - indicates lack of extension, + indicates hyperextension    Ring finger active range of motion   Metacarpal phalangeal (MCP) joint extension/flexion Right 30   Proximal interphalangeal (PIP) joint extension/flexion Right -20/60   Distal interphalangeal (DIP) joint extension/flexion Right 24  Access Code: [email or web address] URL: [email or web address]  ??? Seated Finger DIP Flexion AROM with Blocking - 2-3 x daily - 7 x weekly - 2 sets - 10 reps   ??? Finger PIP Flexion Extension with Blocking - 2-3 x daily - 7 x weekly - 2 sets - 10 reps   ??? Seated Finger Composite Flexion Extension - 2-3 x daily - 7 x weekly - 2 sets - 10 reps   ??? Seated Finger Composite Flexion Stretch - 2-3 x daily - 7 x weekly - 2 sets - 10 reps      Patient Education:   Education Content:   see above, discomfort but not pain expected with exercises, nighttime orthosis   Education Delivery Method:   verbal, written and demonstration   Education Response:   verbalizes understanding and demonstrates understanding    ASSESSMENT: patient very stiff with range of motion, may need to wean from buddy straps. Patient perseverating on having clean items but will need to clean to his liking rather than receiving new ones. Patient having pain with range of motion but will need to be diligent with home program to improve this.    PLAN: update range of motion, reinforce home exercise program, efforts to regain motion.    Patient was seen for a total of 35 minutes of treatment time.  25 minutes was in services represented by timed CPT codes.    Darrin Apodaca Joylene Igo, COTA/L

## 2020-09-30 ENCOUNTER — Ambulatory Visit: Payer: MEDICAID

## 2020-09-30 DIAGNOSIS — S63279A Dislocation of unspecified interphalangeal joint of unspecified finger, initial encounter: Secondary | ICD-10-CM

## 2020-09-30 DIAGNOSIS — S62629A Displaced fracture of medial phalanx of unspecified finger, initial encounter for closed fracture: Secondary | ICD-10-CM

## 2020-09-30 NOTE — Patient Instructions
Must work on finger range of motion daily!    ------------------------------------------------------------    Thank you for entrusting your care to Dr. Elonda Husky and the [facility] Division of Sports Medicine.  As we continue your medical care, we would like to share a few helpful resources with regard to your ongoing care coordination and follow-up appointments:     Imaging (X-Ray, MRI, Ultrasound): You can schedule ay needed imaging directly with the [facility] Radiology Department by calling [phone].    Physical Therapy: If you have questions regarding your physical therapy prescription or need your physical therapy extended, please send a message via the MyChart secure messaging system so that we can renew or adjust your prescription accordingly. Please include the name of your physical therapy location.      Medication Refills: If you need a medication refill, please send a message via the MyChart secure messaging system so that we can refill your prescription appropriately.    Medication Authorization: If you have questions about insurance authorization for viscosupplementation (Monovisc, Durolane, SynviscOne, Orthovisc, Euflexxa, Supartz, etc.) or long-acting triamcinolone (Zilretta), please send a message via the MyChart secure messaging system and my assistant will send you an update as soon as possible.      Medical Questions: If you have additional questions about your injury, diagnosis, or treatment plan, you can either send a secure message via MyChart or schedule a video visit follow-up to discuss your questions in detail.    Follow-Up Visits: We try to prioritize telemedicine virtual follow-up visits when possible to minimize your time away from home & work as well as the hassle of [place] traffic and parking on campus.  To schedule a follow-up visit, e-mail my assistant, Sinnetti Piper, at [email or web address] who will schedule you for a video visit follow-up.  Note that some issues may specifically require an in-person follow-up visit (e.g. procedures, ultrasounds, etc.).    Clinical Availability: Dr. Elonda Husky works in the Division of Sports Medicine in our [place] clinic on Monday and in our telehealth clinic on Friday.  In addition to his clinical work in the Division of Sports Medicine, he spends time working in the [employer] for [facility], the [employer] [facility], and within [employer] on Tuesday, Wednesday, and Thursday.       New Injuries or Issues: New musculoskeletal injuries are unpredictable and will inevitable arise.  To discuss a new injury or issue, please e-mail my assistant at [email or web address] who will schedule you for the appropriate follow-up visit.      Sincerely,    Cherly Hensen, MD, MBA  Assistant Clinical Professor, [place] Division of Sports Medicine  Department of Orthopaedic Surgery & [facility] Medicine  Team Physician, [facility]    ------------------------------------------------------------------------------------

## 2020-09-30 NOTE — Progress Notes
[facility] Department of Orthopaedic Surgery  Division of Sports Medicine    Sports Medicine Follow-Up Visit      Date: 09/30/2020  Primary Care Physician:  Murtis Sink., MD, MPH    Chief Complaint: Left hand 4th PIP dislocation + avulsion fracture, 6 weeks post-injury    Interval Events:  Dustin Watts is a 64 y.o. male presenting for follow-up of left hand 4th PIP dislocation + avulsion fracture.  Since his last appointment, he has buddy taped the fingers and began OT last week.  His pain at this time is mild and is described as soreness of the PIP joint, predominantly affecting the volar aspect of the 4th PIP.  The pain continues to be aggravated by finger flexion and improves with immobilization.  Overall, he feels the pain is gradually improving.    The patient has no additional complaints at this time and otherwise feels well.    Past Medical History:  Past Medical History:   Diagnosis Date   ??? Acne    ??? Arthritis    ??? Cerebral palsy (HCC/RAF)    ??? Eczema    ??? Hypertension    ??? Impaired glucose tolerance 03/23/2016    HGBA1C of 5.7 on 03/19/2016    ??? Low vitamin D level 05/07/2016    05/07/2016 - taking antiepiletpic which may exacerbate it, started on ergo 50000, will recheck level in about 2 months    ??? Overweight    ??? Seizure (HCC/RAF) 1973    chronically on Dilantin, no seizure in 25 years, followed by Neurologist (Dr. Wiliam Ke) at [facility]   ??? Shortening of arm, congenital     right arm       Past Surgical History:  Past Surgical History:   Procedure Laterality Date   ??? rectal abscess  2010    surgery done at [facility] the [facility]       Medications:  Outpatient Medications Prior to Visit   Medication Sig Dispense Refill   ??? ASPIRIN LOW DOSE 81 MG EC tablet TAKE 1 TABLET BY MOUTH EVERY DAY 90 tablet 3   ??? ATORVASTATIN 40 mg tablet TAKE 1 TABLET BY MOUTH EVERY DAY 90 tablet 3   ??? benzoyl peroxide-erythromycin 5-3% gel Apply to acne once daily.. 46.6 g 11   ??? Calcium Carbonate-Vitamin D (OYSTER SHELL CALCIUM + VITAMIN D) 500-200 mg-unit TABS tablet TAKE 1 TABLET BY MOUTH 3 TIMES A DAY     ??? clindamycin 300 mg capsule TAKE 2 CAPSULES ORALLY EVERY 8 HOURS     ??? DILANTIN 100 MG ER capsule TAKE 4 CAPSULES ORALLY AT BEDTIME     ??? IBUPROFEN 800 mg tablet TAKE 1 TABLET (800 MG TOTAL) BY MOUTH DAILY AS NEEDED. 30 tablet 0   ??? oxyCODONE 5 mg tablet Take 1 tablet (5 mg total) by mouth two (2) times daily as needed. Max Daily Amount: 10 mg 20 tablet 0   ??? RETIN-A MICRO PUMP 0.04 % gel Apply topically at bedtime. 50 g 11   ??? sulfacetamide-sulfur 9-4.5 % LIQD liquid Apply to face twice daily. 454 g 11   ??? Tretinoin Microsphere (RETIN-A MICRO PUMP) 0.06 % GEL Apply pea sized amount to face nightly. 50 g 11     No facility-administered medications prior to visit.       Allergies:   Allergies   Allergen Reactions   ??? Topiramate      Eye swelling   ??? Sulfa Antibiotics Angioedema  No anaphylaxis   ??? Amlodipine Other (See Comments)     Lip swelling without anaphylaxis   ??? Carvedilol Angioedema     Lip swelling without anaphylaxis   ??? Losartan Rash     No hive, no anaphylaxis, unclear if truly has rash from this medicine   ??? Penicillins Rash     ''bumps on body''       Review of Systems: A 14-point review of systems was performed.  With the exception of the HPI, all other review of systems was negative.     PHYSICAL EXAM:     Vitals: There were no vitals taken for this visit.  General: NAD, pleasant & cooperative  Head: EOMI, no facial lesions  Left Hand:  Inspection: Skin: intact, Swelling: moderate over the 4th PIP, Deformity: none  Palpation: Warmth: no warmth, Tenderness: moderate over the volar 4th PIP  ROM: limited by pain in PIP flexion (can flex to 90 deg)  Strength: normal  Neurologic: normal sensation and reflexes  Vascular: normal, good capillary refill, good color and radial pulse present  Neuro: Normal sensation, no motor deficits  Skin: No ulcers, erythema, or skin breakdown  Psych: Alert, appropriate affect    IMAGING:     XR Left Finger (09/30/2020):  A small calcification by the proximal phalanx head has nearly resorbed.  A small fracture at the base of the middle phalanx is unchanged in alignment.  Minimal callus is seen.  The finger is normally aligned.    IMPRESSION:     Dustin Watts is a 64 y.o. male who presents for follow-up of left hand 4th PIP dislocation + avulsion fracture, 6 weeks post-injury.  Overall, he feels the pain is gradually improving.    RECOMMENDATIONS & PLAN:     Closed dislocation of interphalangeal joint of left hand  Closed avulsion fracture of middle phalanx of finger  - D/C buddy taping  - Emphasized importance of daily PIP range of motion exercises to prevent stiffness  - Continue OT     Follow-Up: Return in about 6 weeks (around 11/11/2020). if symptoms persist    The above recommendations were communicated to the patient who expressed understanding.    Sofie Rower, MD, MBA  Division of Sports Medicine & Non-Operative Orthopedics  Department of Orthopaedic Surgery  [facility]

## 2020-10-01 ENCOUNTER — Inpatient Hospital Stay: Payer: MEDICARE

## 2020-10-08 ENCOUNTER — Ambulatory Visit: Payer: MEDICARE

## 2020-10-08 NOTE — Treatment Summary
OCCUPATIONAL THERAPY TREATMENT NOTE    PATIENT: Dustin Watts  GENDER: male  AGE: 64 y.o.  DOB: 1956-09-21  MRN: [id number]    Diagnosis:     ICD-10-CM    1. Closed dislocation of interphalangeal joint of finger of left hand, initial encounter  S63.279A    2. Avulsion fracture of middle phalanx of finger, closed, initial encounter  S62.629A      Medicare:    Visit number: 3 (next report due visit 10)    Plan of Care Expires: 12/09/2020  Authorization expires:  Medical necessity     Precautions: none    Pain:Yes   Pain In: 6/10  Pain Out: 5-6/10  Location: left ring and middle fingers  Duration: Variable, occasional  Description: Sharp and Tingling, pulsating    SUBJECTIVE: patient reports the MD told him to discontinue the buddy taping and per patient also the nighttime orthosis, this week his finger is not that swollen. Has been tolerating the orthosis well    OBJECTIVE:  - highly encouraged patient to wear orthosis nightly, advised to bring in next visit for possible adjustment  - patient wearing digi sleeve, less oedema noted per visual observation  - updated active range of motion measurements  - rotating large and small meditation bells counter clockwise in hand  - 9 hole peg test done but rather than placing on table, patient holding pegs in hand before replacing into holes  - volar plate massage  - Grade II and III mobilizations to proximal interphalangeal joint   - sustained proximal interphalangeal extension stretch, repeated mobilizations and stretching  - place and hold in extension  - reinforced blocking exercises  - re measured range of motion as noted below    9 hole peg test   Left 33 seconds      - indicates lack of extension, + indicates hyperextension    Ring finger active range of motion 11/30  Metacarpal phalangeal (MCP) joint extension/flexion Right 49   Proximal interphalangeal (PIP) joint extension/flexion Right -13/80 (-5 post treatment)   Distal interphalangeal (DIP) joint extension/flexion Right 40       Access Code: [email or web address] URL: [email or web address]  ??? Seated Finger DIP Flexion AROM with Blocking - 2-3 x daily - 7 x weekly - 2 sets - 10 reps   ??? Finger PIP Flexion Extension with Blocking - 2-3 x daily - 7 x weekly - 2 sets - 10 reps   ??? Seated Finger Composite Flexion Extension - 2-3 x daily - 7 x weekly - 2 sets - 10 reps   ??? Seated Finger Composite Flexion Stretch - 2-3 x daily - 7 x weekly - 2 sets - 10 reps      Patient Education:   Education Content:   see above, discomfort but not pain expected with exercises, nighttime orthosis   Education Delivery Method:   verbal, written and demonstration   Education Response:   verbalizes understanding and demonstrates understanding    ASSESSMENT: patient with gains in range of motion in all directions since last visit and compliant with orthotic wearing per patient report. Patient responded well to manual therapies today with 8 degrees improvement while in clinic.     PLAN: continue to monitor range of motion, strength measurement when appropriate. Possibly provide new digi sleeve, adjust nighttime orthosis    Patient was seen for a total of 30 minutes of treatment time.  30 minutes was in services represented by timed CPT codes.    Daylene Posey  Roxy Horseman, COTA/L

## 2020-10-10 ENCOUNTER — Ambulatory Visit: Payer: BLUE CROSS/BLUE SHIELD

## 2020-10-10 DIAGNOSIS — L700 Acne vulgaris: Secondary | ICD-10-CM

## 2020-10-10 MED ORDER — BENZOYL PEROXIDE-ERYTHROMYCIN 5-3 % EX GEL
11 refills | Status: AC
Start: 2020-10-10 — End: ?

## 2020-10-10 MED ORDER — SULFACETAMIDE SODIUM-SULFUR 9-4.5 % EX LIQD
11 refills | Status: AC
Start: 2020-10-10 — End: ?

## 2020-10-10 MED ORDER — RETIN-A MICRO PUMP 0.06 % EX GEL
ORAL | 11 refills | 30.00000 days | Status: AC
Start: 2020-10-10 — End: 2020-10-24

## 2020-10-10 NOTE — Progress Notes
PATIENT: Dustin Watts  MRN: [id number]  DOB: 04/05/1956  DATE OF SERVICE: 10/10/2020    REFERRING PRACTITIONER: No ref. provider found  PRIMARY CARE PROVIDER: Murtis Sink., MD, MPH  CHIEF COMPLAINT:   Chief Complaint   Patient presents with   ??? Acne       Subjective:      Dustin Watts is a 64 y.o. year old male who was seen in clinic for follow up acne and lichen simplex chronicus.    Last seen 10/19/2019    Needs refills - on his last ones. On retin-a micro pump 0.06% which was originally given because his pharmacy didn't have the 0.04%.    On sulfacetamide sulfur wash (tolerates fine despite h/o sulfa allergy) and benzamycin gel daily as needed.    Wonders if it could be creams. He puts various serums, collagen cream, roc cream, tightening cream, something from a TV dermatologist.     Past Medical History:   Diagnosis Date   ??? Acne    ??? Arthritis    ??? Cerebral palsy (HCC/RAF)    ??? Eczema    ??? Hypertension    ??? Impaired glucose tolerance 03/23/2016    HGBA1C of 5.7 on 03/19/2016    ??? Low vitamin D level 05/07/2016    05/07/2016 - taking antiepiletpic which may exacerbate it, started on ergo 50000, will recheck level in about 2 months    ??? Overweight    ??? Seizure (HCC/RAF) 1973    chronically on Dilantin, no seizure in 25 years, followed by Neurologist (Dr. W[facility]am Ke[facility]   ??? Shortening of arm, congenital     right arm     Past Surgical History:   Procedure Laterality Date   ??? rectal abscess  2010    surgery done at [facility] History   Problem Relation Age of Onset   ??? Heart disease Mother    ??? Diabetes Mother    ??? Kidney disease Mother    ??? Stroke Brother    ??? Colon cancer Neg Hx    ??? Prostate cancer Neg Hx    ??? Ovarian cancer Neg Hx    ??? Breast cancer Neg Hx    ??? Hyperlipidemia Neg Hx    ??? Hypertension Neg Hx        Outpatient Medications Prior to Visit   Medication Sig   ??? ASPIRIN LOW DOSE 81 MG EC tablet TAKE 1 TABLET BY MOUTH EVERY DAY   ??? ATORVASTATIN 40 mg tablet TAKE 1 TABLET BY MOUTH EVERY DAY   ??? benzoyl peroxide-erythromycin 5-3% gel Apply to acne once daily.Marland Kitchen   ??? Calcium Carbonate-Vitamin D (OYSTER SHELL CALCIUM + VITAMIN D) 500-200 mg-unit TABS tablet TAKE 1 TABLET BY MOUTH 3 TIMES A DAY   ??? clindamycin 300 mg capsule TAKE 2 CAPSULES ORALLY EVERY 8 HOURS   ??? DILANTIN 100 MG ER capsule TAKE 4 CAPSULES ORALLY AT BEDTIME   ??? IBUPROFEN 800 mg tablet TAKE 1 TABLET (800 MG TOTAL) BY MOUTH DAILY AS NEEDED.   ??? oxyCODONE 5 mg tablet Take 1 tablet (5 mg total) by mouth two (2) times daily as needed. Max Daily Amount: 10 mg   ??? RETIN-A MICRO PUMP 0.04 % gel Apply topically at bedtime.   ??? sulfacetamide-sulfur 9-4.5 % LIQD liquid Apply to face twice daily.   ??? Tretinoin Microsphere (RETIN-A MICRO PUMP) 0.06 % GEL Apply pea sized amount to face  nightly.     No facility-administered medications prior to visit.       Allergies   Allergen Reactions   ??? Topiramate      Eye swelling   ??? Sulfa Antibiotics Angioedema     No anaphylaxis   ??? Amlodipine Other (See Comments)     Lip swelling without anaphylaxis   ??? Carvedilol Angioedema     Lip swelling without anaphylaxis   ??? Losartan Rash     No hive, no anaphylaxis, unclear if truly has rash from this medicine   ??? Penicillins Rash     ''bumps on body''         Review of Systems:  Constitutional: negative  Skin:  otherwise negative      Objective:      BP 136/84  ~ Temp 36.3 ???C (97.3 ???F)      General:   alert, appears stated age and cooperative     Neurologic:   Grossly normal   Psychiatric:   oriented to time, place and person, mood and affect are within normal limits     Skin Exam:  Skin Type: 4  Head/face: examined  Eyes (lids/conjunctiva): examined  Lips/gums/teeth: examined  Neck: examined  All areas examined were within normal limits with the following exceptions: face appears clear today. Scattered skin colored papules on face - stable.          Assessment:           Plan/ Recommendation:        Diagnoses and associated orders for this visit:    Acne - chronic, stable - doing well, continue:  - benzoyl peroxide-erythromycin 5-3% gel; Apply to acne once daily.  - tretinoin microspheres (RETIN-A MICRO) 0.06% gel; Apply nightly to affected areas.  - Sulfacetamide Sodium-Sulfur (SUMADAN WASH) 9-4.5 % LIQD; Apply 1 drop topically two (2) times daily. WASH AFFECTED AREA(S) TWICE DAILY AS DIRECTED    Refilled - printed prescriptions given.    Dermal nevi of face - reassurance      Assessment and plan were discussed with the patient.     Follow up: 12 months or sooner prn     Author:  Arbutus Ped 10/10/2020 9:28 AM

## 2020-10-18 ENCOUNTER — Ambulatory Visit: Payer: MEDICAID

## 2020-10-18 NOTE — Treatment Summary
OCCUPATIONAL THERAPY TREATMENT NOTE    PATIENT: Dustin Watts  GENDER: male  AGE: 64 y.o.  DOB: 20-Apr-1956  MRN: [id number]    Diagnosis:   No diagnosis found.  Medicare:    Visit number: Visit count could not be calculated. Make sure you are using a visit which is associated with an episode. (next report due visit 10)    Plan of Care Expires: 12/09/2020  Authorization expires:  Medical necessity     Precautions: none    Pain:Yes   Pain In: 6/10  Pain Out: 5-6/10  Location: left ring and middle fingers  Duration: Variable, occasional  Description: Sharp and Tingling, pulsating    SUBJECTIVE: patient reports the MD told him to discontinue the buddy taping and per patient also the nighttime orthosis, this week his finger is not that swollen. Has been tolerating the orthosis well    OBJECTIVE:  - highly encouraged patient to wear orthosis nightly, advised to bring in next visit for possible adjustment  - patient wearing digi sleeve, less oedema noted per visual observation  - updated active range of motion measurements  - rotating large and small meditation bells counter clockwise in hand  - 9 hole peg test done but rather than placing on table, patient holding pegs in hand before replacing into holes  - volar plate massage  - Grade II and III mobilizations to proximal interphalangeal joint   - sustained proximal interphalangeal extension stretch, repeated mobilizations and stretching  - place and hold in extension  - reinforced blocking exercises  - re measured range of motion as noted below    9 hole peg test   Left 33 seconds      - indicates lack of extension, + indicates hyperextension    Ring finger active range of motion 11/30  Metacarpal phalangeal (MCP) joint extension/flexion Right 49   Proximal interphalangeal (PIP) joint extension/flexion Right -13/80 (-5 post treatment)   Distal interphalangeal (DIP) joint extension/flexion Right 40       Access Code: [email or web address] URL: [email or web address]  ??? Seated Finger DIP Flexion AROM with Blocking - 2-3 x daily - 7 x weekly - 2 sets - 10 reps   ??? Finger PIP Flexion Extension with Blocking - 2-3 x daily - 7 x weekly - 2 sets - 10 reps   ??? Seated Finger Composite Flexion Extension - 2-3 x daily - 7 x weekly - 2 sets - 10 reps   ??? Seated Finger Composite Flexion Stretch - 2-3 x daily - 7 x weekly - 2 sets - 10 reps      Patient Education:   Education Content:   see above, discomfort but not pain expected with exercises, nighttime orthosis   Education Delivery Method:   verbal, written and demonstration   Education Response:   verbalizes understanding and demonstrates understanding    ASSESSMENT: patient with gains in range of motion in all directions since last visit and compliant with orthotic wearing per patient report. Patient responded well to manual therapies today with 8 degrees improvement in proximal interphalangeal extension while in clinic.     PLAN: continue to monitor range of motion, strength measurement when appropriate. Possibly provide new digi sleeve, adjust nighttime orthosis    Patient was seen for a total of 30 minutes of treatment time.  30 minutes was in services represented by timed CPT codes.    Jonhatan Hearty J Danne Vasek, OT/L

## 2020-10-24 ENCOUNTER — Telehealth: Payer: MEDICAID

## 2020-10-24 MED ORDER — RETIN-A MICRO PUMP 0.06 % EX GEL
ORAL | 0 refills | 30.00000 days | Status: AC
Start: 2020-10-24 — End: ?

## 2020-10-24 NOTE — Telephone Encounter
Message to Practice/Provider      Message: patient called to let doctor know he rescheduled appt for the xray to the new year and would also like to tell him happy holidays and happy new year !    Return call is not being requested by the patient or caller.    Patient or caller has been notified of the 24-48 hour processing turnaround time if applicable.

## 2020-10-25 ENCOUNTER — Ambulatory Visit: Payer: MEDICARE

## 2020-10-25 ENCOUNTER — Ambulatory Visit: Payer: BLUE CROSS/BLUE SHIELD

## 2020-10-25 NOTE — Treatment Summary
OCCUPATIONAL THERAPY TREATMENT NOTE    PATIENT: Dustin Watts  GENDER: male  AGE: 64 y.o.  DOB: 1956/09/06  MRN: [id number]    Diagnosis:     ICD-10-CM    1. Closed dislocation of interphalangeal joint of finger of left hand, initial encounter  S63.279A    2. Avulsion fracture of middle phalanx of finger, closed, initial encounter  S62.629A      Medicare:    Visit number: 4 (next report due visit 10)    Plan of Care Expires: 01/17/2021  Authorization expires:  Medical necessity     Precautions: none    Pain:Yes   Pain In: 5/10  Pain Out: 5/10  Location: left ring  Duration: Variable, occasional  Description: Sharp and Tingling, pulsating    SUBJECTIVE: ''Last night I was feeling some tingling in the finger. I lost the digi sleeve but I've doing the exercises. I do everything slowly because I don't want to hurt the finger. I can't pick up anything heavy like a pot, cut/chop food.''    OBJECTIVE:    Therapy rendered today:    -objective measurements updated below   -paraffin for right hand 5 minutes  - Grade II and III mobilizations to 4th proximal interphalangeal joint for extension  -instrument assisted soft tissue mobilization of volar/dorsal 4th digit  -extension/flexion stretch of 4th digit proximal interphalangeal joint  -composite flexion stretch of 4th digit  -padding added to distal half of night orthosis to promote increased extension per patient request    - indicates lack of extension, + indicates hyperextension    Fitted patient with pre-fabricated left static progressive             x 1 L3925 LMB medium orthosis in order to enhance biomechanics of joint, optimize functional use and increase functional use of hand. Patient instructed to monitor skin for any areas of irritation, recommended wear schedule, proper donning and doffing of orthosis, and care of orthosis, Patient provided hand out for written instructions as well. Patient demonstrated good understanding of the wear and care of the orthosis and displayed good fit in orthosis post fabrication.    Precautions:  Instructed patient to wear orthosis the first time for 20 minutes.  Take it off and check for any of the following:  ??? Redness that persists more than 20 minutes  ??? Increased swelling  ??? Increased pain  ??? Areas of pressure  ??? Blisters  ??? Excessive stiffness, pain, or numbness  ??? If any of the above occurs, notify therapist as soon as possible for further instruction.  Wearing Schedule:  If no problems were present after wearing or using the orthosis, then continue to wear or use for:  Wear orthosis for 30 minutes 3 times per day   Care of orthosis:  ??? Keep orthoses away from open flames, heat, water heaters, or prolonged sunlight (such as in a hot, closed car).  Excessive heat may cause your orthosis to change shape.  ??? Keep orthosis away from pets.  ??? If you are able to take orthosis off for a period of time, it can be cleaned:  - Rub the inside with alcohol to reduce odor.    Paperwork with above information issued to patient.       Ring finger active range of motion 10/25/20  Metacarpal phalangeal (MCP) joint extension/flexion Right 75   Proximal interphalangeal (PIP) joint extension/flexion Right -16/73 (-7 degrees of extension post treatment)   Distal interphalangeal (DIP) joint extension/flexion Right  37       Access Code: [email or web address] URL: [email or web address]  ??? Seated Finger DIP Flexion AROM with Blocking - 2-3 x daily - 7 x weekly - 2 sets - 10 reps   ??? Finger PIP Flexion Extension with Blocking - 2-3 x daily - 7 x weekly - 2 sets - 10 reps   ??? Seated Finger Composite Flexion Extension - 2-3 x daily - 7 x weekly - 2 sets - 10 reps   ??? Seated Finger Composite Flexion Stretch - 2-3 x daily - 7 x weekly - 2 sets - 10 reps      Patient Education:   Education Content:   see above, discomfort but not pain expected with exercises, nighttime orthosis   Education Delivery Method:   verbal, written and demonstration   Education Response:   verbalizes understanding and demonstrates understanding    ASSESSMENT: Significant gains in 4th metacarpophalangeal flexion this week. Patient responds well to heat and stretching/mobilization. Gained 9 degrees of proximal interphalangeal joint extension post treatment. Further gains to be expected with LMB orthosis wear. Mild swelling persists at proximal interphalangeal joint- XXL digisleeve reissued per patient's request as he lost this. New plan of care submitted to include LMB orthosis today.    PLAN: follow up on fit and function of LMB orthosis, continue to monitor range of motion, strength measurement when appropriate.     Patient was seen for a total of 45 minutes of treatment time.  35 minutes was in services represented by timed CPT codes.    Jasa Dundon J Zyair Russi, OT/L    Plan of Care:   ???  Problem List:  Decreased range of motion, Decreased strength, Increased edema and Increased pain  Decreased knowledge regarding ergonomics / positioning, Decreased knowledge regarding joint protection, Decreased tolerance for keyboard / computer use, Difficulty managing handles / door knobs / keys / faucets, Limitations in ability to complete dressing related tasks, Limitations in ability to handle groceries, Limitations in ability to perform driving related tasks, Limitations in ability to perform fine motor activities associated with dressing, Limitations in ability to perform household tasks , Limitations in handling utensils, Limitations in meal preparation, Limitations in opening jars / bottles and Unable to reach all areas during bathing / hygiene tasks   ???  Goals:  Short Term Goal:  Patient will learn and implement at least three techniques to decrease finger swelling for ease in grasping objects in 2 sessions.  Patient will increase left 4th metacarpophalangeal joint active flexion by at least 30 degrees for ease in performing dressing tasks in 6 weeks.   Patient will sleep throughout night with no more than 1-2/10 pain in 6 weeks.   ???  Long Term Goal:  Patient will demonstrate at least 20-25 pounds of grip strength for ease in opening jars/bottles in 12 weeks.  Patient will perform all self care with modified independence - independence in 12 weeks.   Patient will sleep sans finger pain in 12 weeks.   ???  ASSESSMENT:  64 year old male presents with referral diagnosis of Closed dislocation of interphalangeal joint of finger of left hand, initial encounter  Avulsion fracture of middle phalanx of finger, closed, initial encounter since 08/15/20.  ???  Signs and symptoms consistent with diagnosis. Moderate swelling at proximal interphalangeal joint and tenderness at volar plate. Limited range of motion in all joints of left 4th digit and patient guarded in flexion involved digits as well. Functional limitations of right upper extremity due to  cerebral palsy as well as left digit injury restrict patient's ability to perform basic self care tasks. Patient assured that light, pain free movement and use of left hand with buddy straps is okay at this point.    ???  Symptoms are in the subacute phase with moderate???irritability.   ???  The patient's primary impairment(s) of left finger include decreased range of motion, decreased grip and pinch strength, increased pain, increased inflammation, decreased dexterity and decreased coordination which is limiting the ability to perform self care, dress, sleep, perform meal preparation, complete work activities, complete fitness/wellness routine, perform household tasks and participate in hobbies.???Refer to problem list.   ???  The patient requires skilled Occupational Therapy that can be safely and effective performed only by a qualified therapist to address the above deficits.  ???  Mahlet Jergens J Vu Liebman, OT  ???  ???  ???  ???  ???  ???  REHAB POTENTIAL: Good  ???  OBSTACLES TO REHABILITATION/CO-MORBIDITIES THAT MAY AFFECT GOALS AND TREATMENT PLAN: Functional limitations of right upper extremity due to cerebral palsy  ???  Plan:   INTERVENTIONS:   Therapeutic Exercise, Manual Therapy, Neuromuscular Re-education, Therapeutic Activities, Ultrasound, Traction, Electrical Stimulation, Iontophoresis, Paraffin, Heat Therapy, Cold Therapy, Patient/Caregiver Education, Self Care Training and Light Therapy, [id number], [id number]  ???  Patient to be seen 1-2 times per week for 12 week(s).  ???  Patient agrees with goals and treatment plan as outlined above:Yes

## 2020-11-11 ENCOUNTER — Ambulatory Visit: Payer: BLUE CROSS/BLUE SHIELD

## 2020-11-11 NOTE — Treatment Summary
OCCUPATIONAL THERAPY TREATMENT NOTE    PATIENT: Dustin Watts  GENDER: male  AGE: 65 y.o.  DOB: [birth year]  MRN: [id number]    Diagnosis:     ICD-10-CM    1. Closed dislocation of interphalangeal joint of finger of left hand, initial encounter  S63.279A    2. Avulsion fracture of middle phalanx of finger, closed, initial encounter  S62.629A      Medicare:    Visit number: 5 (next report due visit 10)    Plan of Care Expires: 01/17/2021  Authorization expires:  Medical necessity     Precautions: none    Pain:Yes   Pain In: 5/10  Pain Out: 5/10  Location: left ring  Duration: Variable, occasional  Description: Sharp and Tingling, pulsating    SUBJECTIVE: ''My finger is doing pretty well. I've been wearing the LMB orthosis twice a day for about 20 minutes, which is comfortable if I wear it for less than 45 minutes. In the morning I do my exercises. It's hard for me to hold a glass cup for a long time.''     OBJECTIVE:    Therapy rendered today:    -paraffin for right hand 5 minutes  - Grade II and III mobilizations to 4th proximal interphalangeal joint for extension  -instrument assisted soft tissue mobilization of volar/dorsal 4th digit  -intrinsic minus stretch   -extension/flexion stretch of 4th digit proximal interphalangeal joint  -composite flexion stretch of 4th digit  -active hook fist while grasping highlighter  -dice toss (grasping dice between 4th/5th digits, supinating forearm, flexing digits, then flicking dice away from palm while extending digits to engage intrinsic/extrinsic muscles of hand)        - indicates lack of extension, + indicates hyperextension      Ring finger active range of motion 10/25/20  Metacarpal phalangeal (MCP) joint extension/flexion Right 75   Proximal interphalangeal (PIP) joint extension/flexion Right -16/73 (-7 degrees of extension post treatment)   Distal interphalangeal (DIP) joint extension/flexion Right 37       Access Code: [email or web address] URL: [email or web address]  ??? Seated Finger DIP Flexion AROM with Blocking - 2-3 x daily - 7 x weekly - 2 sets - 10 reps   ??? Finger PIP Flexion Extension with Blocking - 2-3 x daily - 7 x weekly - 2 sets - 10 reps   ??? Seated Finger Composite Flexion Extension - 2-3 x daily - 7 x weekly - 2 sets - 10 reps   ??? Seated Finger Composite Flexion Stretch - 2-3 x daily - 7 x weekly - 2 sets - 10 reps  -active hook fist while grasping highlighter    Patient Education:   Education Content:   see above, discomfort but not pain expected with exercises, nighttime orthosis   Education Delivery Method:   verbal, written and demonstration   Education Response:   verbalizes understanding and demonstrates understanding    ASSESSMENT: Patient tolerates LMB orthosis well; was instructed to wear it for only up to 30 minutes at a time as he experiences some pain if wearing it for more than 45 minutes. Patient responds well to heat and stretching/mobilization. 11 degree extension lag at proximal interphalangeal joint post treatment. Stiffness in distal interphalangeal flexion so hook fist exercises added to home exercise program today.    PLAN: coban wrap with paraffin distal interphalangeal, continue to monitor range of motion, grip strengthening, strength measurement when appropriate.     Patient was seen for a total of 30 minutes of  treatment time.  25 minutes was in services represented by timed CPT codes.    Seidy Labreck J Michal Strzelecki, OT/L

## 2020-11-18 ENCOUNTER — Ambulatory Visit: Payer: BLUE CROSS/BLUE SHIELD

## 2020-11-18 ENCOUNTER — Ambulatory Visit: Payer: MEDICAID

## 2020-11-18 NOTE — Treatment Summary
OCCUPATIONAL THERAPY TREATMENT NOTE    PATIENT: MCIHAEL Watts  GENDER: male  AGE: 65 y.o.  DOB: 1955-12-09  MRN: [id number]    Diagnosis:     ICD-10-CM    1. Closed dislocation of interphalangeal joint of finger of left hand, initial encounter  S63.279A    2. Avulsion fracture of middle phalanx of finger, closed, initial encounter  S62.629A      Medicare:    Visit number: 6 (next report due visit 10)    Plan of Care Expires: 01/17/2021  Authorization expires:  Medical necessity     Precautions: none    Pain:Yes   Pain In: 4/10  Pain Out: 4/10  Location: left ring  Duration: Variable, occasional  Description: Sharp and Tingling, pulsating    SUBJECTIVE: ''Sometimes there's a burning sensation at the tip of my 4th digit. There was one night where I was having numbness in my hand when wearing the orthosis. I took it off and did exercises and it felt better. The numbness didn't return after that night.''     OBJECTIVE:    Therapy rendered today:    -updated objective measurements below   -paraffin for right hand 5 minutes with coban wrap for increased proximal interphalangeal and distal interphalangeal joints of left 4th digit   - Grade II and III mobilizations to 4th proximal interphalangeal joint for extension  -instrument assisted soft tissue mobilization of volar/dorsal 4th digit  -composite flexion stretch of left digits for full fist   -gripping and rolling and velcro dowel against velcro board   -extension/flexion stretch of 4th digit proximal interphalangeal joint  -composite flexion stretch of 4th digit      - indicates lack of extension, + indicates hyperextension      Ring finger active range of motion 11/18/20  Metacarpal phalangeal (MCP) joint extension/flexion Right 74   Proximal interphalangeal (PIP) joint extension/flexion Right -17/77    Distal interphalangeal (DIP) joint extension/flexion Right 40       Access Code: [email or web address] URL: [email or web address]  ??? Seated Finger DIP Flexion AROM with Blocking - 2-3 x daily - 7 x weekly - 2 sets - 10 reps   ??? Finger PIP Flexion Extension with Blocking - 2-3 x daily - 7 x weekly - 2 sets - 10 reps   ??? Seated Finger Composite Flexion Extension - 2-3 x daily - 7 x weekly - 2 sets - 10 reps   ??? Seated Finger Composite Flexion Stretch - 2-3 x daily - 7 x weekly - 2 sets - 10 reps  -active hook fist while grasping highlighter    Patient Education:   Education Content:   see above, discomfort but not pain expected with exercises, nighttime orthosis   Education Delivery Method:   verbal, written and demonstration   Education Response:   verbalizes understanding and demonstrates understanding    ASSESSMENT: Patient making gains in flexion of all joints of 4th digit, although extension lag of proximal interphalangeal joint persists. Patient responds well to heat and stretching/mobilization. Decreased stiffness in distal interphalangeal. Follow up to see if numbness of left hand persists at night. Patient advised not to wear night orthosis too tight to prevent possible compression of left hand/digit.    PLAN: fine motor coordination for ''separation'' of hand, coban wrap with paraffin distal interphalangeal, continue to monitor range of motion, pinch and grip strengthening    Patient was seen for a total of 30 minutes of treatment time.  25 minutes was in services  represented by timed CPT codes.    Albin Duckett J Njeri Vicente, OT/L

## 2020-11-20 ENCOUNTER — Ambulatory Visit: Payer: PRIVATE HEALTH INSURANCE

## 2020-11-21 ENCOUNTER — Inpatient Hospital Stay: Payer: MEDICARE

## 2020-11-21 DIAGNOSIS — R2241 Localized swelling, mass and lump, right lower limb: Secondary | ICD-10-CM

## 2020-11-22 ENCOUNTER — Ambulatory Visit: Payer: MEDICAID

## 2020-11-22 DIAGNOSIS — K429 Umbilical hernia without obstruction or gangrene: Secondary | ICD-10-CM

## 2020-11-22 DIAGNOSIS — G40909 Epilepsy, unspecified, not intractable, without status epilepticus: Secondary | ICD-10-CM

## 2020-11-22 DIAGNOSIS — Z0000 Encounter for general adult medical examination without abnormal findings: Secondary | ICD-10-CM

## 2020-11-22 DIAGNOSIS — L700 Acne vulgaris: Secondary | ICD-10-CM

## 2020-11-22 DIAGNOSIS — M170 Bilateral primary osteoarthritis of knee: Secondary | ICD-10-CM

## 2020-11-22 MED ORDER — SHINGRIX 50 MCG/0.5ML IM SUSR
.5 mL | Freq: Once | INTRAMUSCULAR | 1 refills | Status: AC
Start: 2020-11-22 — End: ?

## 2020-11-22 NOTE — Progress Notes
[facility] INTERNAL MEDICINE & PEDIATRICS  [facility]  [address]  [address]  [place] [address]  Phone: [phone]  FAX: [phone]  [phone]    Preventive Health Visit - Medicare Visit     Subjective:     CC:    Results (ultrasound) and Annual Exam    [ ]  G0402 - Initial preventive physical examination (IPPE) time examination performed within 12 months of the first effective date of Medicare Part B coverage (does not cover any clinical laboratory tests)  [ ]  G0403 - ECG, routine, screening for initial preventive physical examination with interpretation and report  [ ]  G0404 - ECG, routine, screening for initial preventive physical examination without interpretation and report  [ ]  G0438 - Medicare annual wellness (AWV) - initial annual wellness visit (outside of the 12 month window from start of coverage); includes a personalized prevention plan of service  [x ] [phone] - Medicare annual wellness (AWV) - follow up 12 months after the last Annual Wellness Exam    Patient Active Problem List   Diagnosis   ??? Acne   ??? Osteoarthritis of both knees   ??? Overweight   ??? White coat syndrome without hypertension   ??? Epilepsy (HCC/RAF)   ??? Prediabetes   ??? Low vitamin D level   ??? Elevated cholesterol with elevated triglycerides   ??? Drug allergy   ??? Umbilical hernia   ??? Gall bladder stones   ??? Plantar wart of left foot   ??? Abnormal brain scan   ??? Allergic conjunctivitis   ??? Varicose veins of both lower extremities   ??? Stress due to family tension   ??? Mild anemia   ??? Skin lump of leg, right   ??? Closed dislocation of interphalangeal joint of finger of left hand, initial encounter   ??? Avulsion fracture of middle phalanx of finger, closed, initial encounter       HPI:   Dustin Watts is a 65 y.o. male who presents for a preventive health exam.    Health Maintenance   Topic Date Due   ??? Shingles (Shingrix) Vaccine (1 of 2) Never done   ??? Colorectal Cancer Screening 05/27/2018   ??? Annual Preventive Wellness Visit  09/09/2019   ??? Tdap/Td Vaccine (2 - Td or Tdap) 03/08/2026   ??? Influenza Vaccine  Completed   ??? Hepatitis C Screening  Completed   ??? COVID-19 Vaccine  Completed   ??? Statin prescribed for ASCVD Prevention or Treatment  Completed   ??? HIV Screening  Addressed       Health Risk Assessment (HRA):    Self assessment of health status  Doing well    Activities of Daily Living:   [x]  Independent in all ADLs and instrumental ADLs   []  Needs help in the following:   []  Bathing: hygiene/grooming  []  Dressing  []  Walking: mobility    []  Toileting; continence  []  Eating  []  Shopping    []  Housekeeping  []  Managing Meds  []  Handling Finance    []  Cooking  []  Using Telephone  []  Other     Hearing impairment  [x]  No issues   []  Discussed the following:     Fall risk  [x]  No issues   []  Falls in the past year?   []  Injury from fall in the past year?  []  Feels unsteady when standing or walking?  []  Worried about falling?  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   []  Poor lighting by day or night in your home?   []  Slippery surfaces in your home?   []  Worried about falling when using your toilet, bath or shower facilities?    Goals of Care and End-of-Life Planning  []  On file   [x]  Discussed as follows: full code     Depression screen  PHQ2 [ ]  positive; [x ] negative    Current Providers and Suppliers  PCP:  Murtis Sink., MD, MPH  Neurology: Wiliam Ke    Healthy habits  [ ]  Discussed sun screen use  [ x] Discussed diet and physical activities  [ x] Discussed dentist visit twice a year    Physical exam screening  [ ]  DRE and prostate exam discussed including the risks/benefits of this testing  [ ]  Breast exam completed  [ ]  Pap +/- HPV testing discussed and performed    Screening labs  [x ] PSA discussed and ordered including the risks/benefits of this testing  [ ]  Chlamydia testing discussed  [ ]  HIV testing discussed  [ ]  Hep C testing discussed  [x ] Lipids ordered  [x ] FIT discussed   [x ] Diabetes screening with A1C discussed     Screening imaging/procedures  [ ]  Dexa scan discussed   [ ]  Mammography discussed   [x ] Colonoscopy discussed   [ ]  AAA screening discussed  [ ]  Lung cancer screening discussed    Vaccines  If applicable, the vaccinations listed in the A/P section below were discussed and administered.      In addition to preventive health, the patient has the separately identifiable issues as shown below.    1. Mass on foot  Timing (onset/duration) - months  Location - R upper foot  Severity - about the same size, no pain  Quality - soft  Context - [place] complete with findings shown below  Modifying factors - no treatment  Associated signs and symptoms - no rash      Review of Systems  On review of systems, all of the following were normal except for the bolded items which were abnormal:  Const - fevers, unusual fatigue  Eyes - vision change, eye irritation  ENT/mouth - rhinorrhea, sore throat  CV - CP, lower extremity edema  Resp - cough, SOB  GI - nausea, vomiting, abd pain, diarrhea, constipation  GU - lesions, discharge  Musc - body aches, joint aches  Skin - lesions, rash  Neuro - weakness, numbness  Psych - depression, anxiety  Endo - weight change, hair loss  Hem/lymph - bleeding, LAD  Allerg/immun- allergies, unusually frequent infections      Past Medical/Surgical/Family/Social History  The following past medical history was reviewed with the patient.  He has a past medical history of Acne, Arthritis, Cerebral palsy (HCC/RAF), Eczema, Hypertension, Impaired glucose tolerance (03/23/2016), Low vitamin D level (05/07/2016), Overweight, Seizure (HCC/RAF) (1973), and Shortening of arm, congenital.    The following past surgical history was reviewed with the patient.  Past Surgical History:   Procedure Laterality Date   ??? rectal abscess  2010    surgery done at [facility] [place]       The following family history was reviewed with the patient.  Family History  Problem Relation Age of Onset   ??? Heart disease Mother    ??? Diabetes Mother    ??? Kidney disease Mother    ??? Stroke Brother    ??? Colon cancer Neg Hx    ??? Prostate cancer Neg Hx    ??? Ovarian cancer Neg Hx    ??? Breast cancer Neg Hx    ??? Hyperlipidemia Neg Hx    ??? Hypertension Neg Hx        The following social history was reviewed with the patient.  Social History     Socioeconomic History   ??? Marital status: Married     Spouse name: Not on file   ??? Number of children: 0   ??? Years of education: Not on file   ??? Highest education level: Not on file   Occupational History   ??? 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 Smoking status: Never Smoker   ??? Smokeless tobacco: Never Used   Vaping Use   ??? Vaping Use: Never used   Substance and Sexual Activity   ??? Alcohol use: No   ??? Drug use: No   ??? Sexual activity: Not Currently     Partners: Female     Comment: never married (incorrectly documented in Epic)   Other Topics Concern   ??? Do you exercise at least a day, 3 or more days a week? No   ??? Types of Exercise? (List in Comments) Yes     Comment: biking infrequently, abdominal exercise   ??? Do you follow a special diet? No   ??? Vegan? No   ??? Vegetarian? No   ??? Pescatarian? No   ??? Lactose Free? No   ??? Gluten Free? No   ??? Omnivore? Yes   Social History Narrative   ??? Not on file     Social Determinants of Health     Financial Resource Strain: Not on file   Physical Activity: Not on file   Stress: Not on file       Medications/Supplements  The following medication list was reviewed with the patient.    Current Outpatient Medications:   ???  ASPIRIN LOW DOSE 81 MG EC tablet, TAKE 1 TABLET BY MOUTH EVERY DAY, Disp: 90 tablet, Rfl: 3  ???  ATORVASTATIN 40 mg tablet, TAKE 1 TABLET BY MOUTH EVERY DAY, Disp: 90 tablet, Rfl: 3  ???  benzoyl peroxide-erythromycin 5-3% gel, Apply to acne once daily.., Disp: 46.6 g, Rfl: 11  ???  Calcium Carbonate-Vitamin D (OYSTER SHELL CALCIUM + VITAMIN D) 500-200 mg-unit TABS tablet, TAKE 1 TABLET BY MOUTH 3 TIMES A DAY, Disp: , Rfl:   ???  clindamycin 300 mg capsule, TAKE 2 CAPSULES ORALLY EVERY 8 HOURS, Disp: , Rfl:   ???  DILANTIN 100 MG ER capsule, TAKE 4 CAPSULES ORALLY AT BEDTIME, Disp: , Rfl:   ???  IBUPROFEN 800 mg tablet, TAKE 1 TABLET (800 MG TOTAL) BY MOUTH DAILY AS NEEDED., Disp: 30 tablet, Rfl: 0  ???  oxyCODONE 5 mg tablet, Take 1 tablet (5 mg total) by mouth two (2) times daily as needed. Max Daily Amount: 10 mg, Disp: 20 tablet, Rfl: 0  ???  RETIN-A MICRO PUMP 0.04 % gel, Apply topically at bedtime., Disp: 50 g, Rfl: 11  ???  sulfacetamide-sulfur 9-4.5 % LIQD liquid, Apply to face twice daily., Disp: 454 g, Rfl: 11  ???  Tretinoin Microsphere (RETIN-A MICRO  PUMP) 0.06 % GEL, APPLY PEA SIZED AMOUNT TOPICALLY TO THE FACE NIGHTLY., Disp: 50 g, Rfl: 0  ???  zoster vac recomb adjuvanted (SHINGRIX) 50 mcg injection, Inject 0.5 mLs into the muscle once for 1 dose., Disp: 0.5 mL, Rfl: 1    Objective:   Physical Exam  BP 150/99  ~ Pulse 89  ~ Temp 36.5 ???C (97.7 ???F) (Tympanic)  ~ Resp 19  ~ Ht 6' (1.829 m)  ~ Wt (!) 226 lb 9.6 oz (102.8 kg)  ~ SpO2 95%  ~ BMI 30.73 kg/m???   Wt Readings from Last 3 Encounters:   11/22/20 (!) 226 lb 9.6 oz (102.8 kg)   08/23/20 222 lb (100.7 kg)   11/14/19 216 lb (98 kg)     Visual acuity screen - grossly normal  Cognitive function screen from direct observation - no change from prior  Constitutional - alert, no distress  Eyes - clear conjunctiva  Ears, Nose, Mouth and Throat - lips/mucosa/tongue normal, posterior oropharynx normal without exudate  Neck - supple with midline trachea, no thyromegaly, no lymphadenopathy  Respiratory - clear to auscultation bilaterally, respirations unlabored  Cardiovascular - regular rhythm, S1 and S2 normal, no murmur, no lower extremity edema  Gastrointestinal - soft, nontender, no hepatomegaly, no splenomegaly  Skin - no rash or lesions, texture and turgor normal  MSK - R sided decreased muscle mass of arm and leg.  5cm soft mobile lump on the upper R foot without erythema or tenderness  Neuro - normal strength throughout, normal speech, normal balance  Psychiatric - normal insight and judgement, normal mental status with adequate orientation    A trained chaperone was present for this exam/procedure: No  Reason why a trained chaperone was not present: Patient declined      Labs  I personally reviewed the following labs  Lab Results   Component Value Date    WBC 5.46 07/27/2019    HGB 13.8 07/27/2019    HCT 41.1 07/27/2019    MCV 98.8 (H) 07/27/2019    PLT 173 07/27/2019     Lab Results   Component Value Date    NA 140 07/27/2019    K 4.1 07/27/2019    CL 104 07/27/2019    CO2 24 07/27/2019    BUN 9 07/27/2019    CREAT 0.59 (L) 07/27/2019    GLUCOSE 101 (H) 07/27/2019    CALCIUM 8.8 07/27/2019    ALT 18 07/27/2019    AST 18 07/27/2019    ALKPHOS 100 07/27/2019    BILITOT 0.3 07/27/2019     Lab Results   Component Value Date    HGBA1C 5.8 (H) 04/14/2019     Lab Results   Component Value Date    CHOL 208 04/14/2019    CHOLDLCAL 111 (H) 04/14/2019    TRIGLY 257 (H) 04/14/2019     Lab Results   Component Value Date    VITD25OH 37 08/05/2018       Studies  [place] lower extremity lump or bump non-vascular right    Result Date: 11/21/2020  [place] LOWER EXTREMITY LUMP OR BUMP NON VASCULAR RIGHT CLINICAL HISTORY: edematous lump on the R foot. COMPARISON: None. TECHNIQUE: Real time grayscale, color Doppler, and cine images of the area of concern were obtained. FINDINGS:  Targeted sonography of the anterior right foot in the area of concern demonstrates a fluctuant heterogeneous collection with internal complexity/septations, measuring 5.2 x 2.6 x 5.4 cm. Vascularity in the thickened internal components with coursing  vessels. Mild subcutaneous edema.     IMPRESSION: Complex hypoechoic fluid collection measuring 5.4 cm corresponding to the area of concern in the anterior right foot, may relate to an evolving hematoma or other complex collection; superinfection not excluded. Correlate with visual inspection and for prior history of trauma. If clinically indicated, aspiration could be considered. Signed by: Ledon Snare   11/21/2020 10:16 AM        Assessment/Plan:       ICD-10-CM    1. Routine general medical examination at a health care facility  Z00.00 Comprehensive Metabolic Panel     CBC     Vitamin D,25-Hydroxy     Lipid Panel     Hgb A1c     Fecal Occult Blood Immunoassay     PSA,Screening     zoster vac recomb adjuvanted (SHINGRIX) 50 mcg injection   2. Nonintractable epilepsy without status epilepticus, unspecified epilepsy type (HCC/RAF)  G40.909 Vitamin D,25-Hydroxy   3. Mild anemia  D64.9 CBC   4. Low vitamin D level  R79.89 Vitamin D,25-Hydroxy   5. Primary osteoarthritis of both knees  M17.0    6. Overweight  E66.3    7. Prediabetes  R73.02 Hgb A1c   8. White coat syndrome without hypertension  R03.0    9. Elevated cholesterol with elevated triglycerides  E78.2    10. Leg edema, left  R60.0    11. Skin lump of leg, right  R22.41    12. Umbilical hernia without obstruction and without gangrene  K42.9    13. Acne vulgaris  L70.0          1. The preventive health counseling was completed as shown in the HPI and with the orders shown above.  Next preventive health exam will be in 1 year.      The following separately identifiable issues were evaluated during this visit:    Problem   Skin Lump of Leg, Right    08/23/2020 - 5cm soft tissue nodule/lump of unclear etiology, by palpation it appears to be a lipoma but it appeared after trauma to the leg so perhaps it is a seroma.  Will get [place] for further eval.  11/22/2020 - US showed 5cm fluid collection, drained today with 18 gauge needle without complication producing mildly serosanguinous fluid, f/u if recurs     Mild Anemia    04/14/2019 - review of CBC in the last decade shows occasional mildly low hgb, and MCV is usually normal, likely not a pathology and normal variation for the patient, okay to continue the MVI with iron, will monitor every year or so.  11/22/2020 - recheck today     Umbilical Hernia    05/27/2017 - reducible, unlikely to be the main cause of the abd pain, unless becomes symptoms does not require further workup at this time, will monitor.  10/07/2017 - counseled on indications for additional imaging or surgical management, advised f/u if any worsening.  07/08/2018- stable, no treatment at this time as rarely symptoms with only mild discomfort  06/08/2019 - occasionally with symptoms, discussed role of surgery and will re-refer    11/22/2020 - has seen surgeon, counseled to f/u for discussion of surgery after covid risk is decreased     Elevated Cholesterol With Elevated Triglycerides    01/21/2017 - elevated lipids again 2 months ago (ASCVD score about 10%) but patient declines new   06/03/2017 - ASCVD score >7.5, would benefit from statin but patient declines, discussed benefits/risks again including  the risk of heart disease and death, and patient prefers to continue with exercise and diet changes.    12/02/2017- ASCVD score still elevated, discussed risks again as shown above, patient declines again, will monitor every 6 months or so.  Counseled on diet/exercise again.  08/05/2018 - ASCVD 13%, again counseled on statin.  Will prescribe Lipitor 40 and pt to consider  09/29/2018 - chronic, stable, declines to continue Lipitor (headaches, bloating), will proceed with diet/exercise, and monitor   11/22/2020 - recheck today, counseled on exercise     Prediabetes    Chronic, relatively stable  04/14/2019- 5.8, counseled on exercise including continue biking  11/22/2020 - recheck today     Epilepsy (Hcc/Raf)    chronically on Dilantin, no seizure in 26 years, followed by Neurologist (Dr. Wiliam Ke) at [facility]  01/10/2018 - well controlled, no change in Dilantin, continue management with neuro, recheck vit D today  11/22/2020 - still asymptomatic, no change in Dilantin, check vit D       Acne    01/21/2017 - continuing with retin-A, has seen derm in the past, no acne today, continue with retin-A prn and f/u if worsens.  09/07/2017- mild, continue management with derm that includes BP-erythromycin, tretinoin, and sulfacetamide  11/22/2020 - seen by derm, is on benzoyl peroxide-erythromycin daily, tretinoin nightly, sulfacetamide twice daily     Leg Edema, Left (Resolved)    06/08/2019 - chornic, mild, workup has been reassuring including doppler, will check BNP and alb-cr ratio to complete the workup.  Otherwise continue with elevation and avoiding salt.  11/22/2020 - resolved         PROCEDURE NOTE: Drainage of fluid collection  Performed by: Lendon Collar. Elison Worrel  Indication: fluid collection, painless, seen on imaging recently  Anesthesia was obtained with 1 ml of 2% lidocaine. The area was prepped in the usual sterile fashion and the site was cleaned with an alcohol pad vigorously for 20 seconds. An 18 gauge needle was used to drain the collection on the upper R foot, return was about 8 ml of lightly serosangionous clear fluid. The patient tolerated the procedure well. bandaid was applied.  Wound care instructions were given.   Post-Procedure Diagnosis: same as indication  Complications: none  Estimated Blood Loss: less than 1 ml  The above plan of care, diagnosis, orders, and follow-up were discussed with the patient.  Questions related to this recommended plan of care were answered.    Follow up: No follow-ups on file.    Lendon Collar Vic Blackbird, MD, MPH  11/22/2020 at 10:47 AM  Time of note filed does not reflect the time of encounter.  Portions of this note may have been created with voice recognition software. Occasional wrong-word or ''sound-alike'' substitutions may have occurred due to the inherent limitations of voice recognition software. Please read the chart carefully and recognize, using context, where these substitutions have occurred.

## 2020-11-23 LAB — Vitamin D,25-Hydroxy: VITAMIN D,25-HYDROXY: 31 ng/mL (ref 20–50)

## 2020-11-23 LAB — PSA,Screening: PSA,SCREENING: 0.55 ng/mL (ref 0–4.5)

## 2020-11-23 LAB — CBC: MEAN CORPUSCULAR HEMOGLOBIN: 33.5 pg — ABNORMAL HIGH (ref 26.4–33.4)

## 2020-11-23 LAB — Comprehensive Metabolic Panel
ALKALINE PHOSPHATASE: 97 U/L (ref 37–113)
GFR ESTIMATE FOR NON-AFRICAN AMERICAN: >89 mL/min/{1.73_m2} (ref 13–62)

## 2020-11-23 LAB — Hgb A1c: HGB A1C - HPLC: 6 — ABNORMAL HIGH (ref <5.7)

## 2020-11-23 LAB — Lipid Panel: TRIGLYCERIDES: 231 mg/dL — ABNORMAL HIGH (ref <150)

## 2020-11-28 ENCOUNTER — Ambulatory Visit: Payer: BLUE CROSS/BLUE SHIELD

## 2020-11-28 NOTE — Treatment Summary
OCCUPATIONAL THERAPY TREATMENT NOTE    PATIENT: Dustin Watts  GENDER: male  AGE: 65 y.o.  DOB: 1956/06/26  MRN: [id number]    Diagnosis:     ICD-10-CM    1. Closed dislocation of interphalangeal joint of finger of left hand, initial encounter  S63.279A    2. Avulsion fracture of middle phalanx of finger, closed, initial encounter  S62.629A      Medicare:    Visit number: 7 (next report due visit 10)    Plan of Care Expires: 01/17/2021  Authorization expires:  Medical necessity     Precautions: none    Pain:Yes   Pain In: 4-5/10  Pain Out: 4/10  Location: left ring  Duration: Variable, occasional  Description: Sharp and Tingling, pulsating    SUBJECTIVE: patient reports continued burning sensation at the tip of his ring finger with the nighttime orthosis. Sometimes he drops items held in the left hand    OBJECTIVE:  - adjusted nighttime orthosis  - desensitization added to home program, advised patient to continue wearing nighttime orthosis and removing as needed  - XXL digit-sleeve provided  - pickup activities with dice, marbles, mini poker chips then a combination of the 3 items  - 9 hole pickup test left side only with patient holding the pegs in hand  - Exercise with hand resting on table with palm facing up, patient bending 2nd and 4th fingers, then 3rd and 5th while keeping the others extended (added to home program)      9 hole peg test  23 seconds left    - indicates lack of extension, + indicates hyperextension    Ring finger active range of motion 11/18/20  Metacarpal phalangeal (MCP) joint extension/flexion Right 79   Proximal interphalangeal (PIP) joint extension/flexion Right -17/86    Distal interphalangeal (DIP) joint extension/flexion Right 45     GRIP (lbs) 2nd rung position  Right:    Left: 39, 34 (pain)     Access Code: [email or web address] URL: [email or web address]  Seated Finger DIP Flexion AROM with Blocking - 2-3 x daily - 7 x weekly - 2 sets - 10 reps   Finger PIP Flexion Extension with Blocking - 2-3 x daily - 7 x weekly - 2 sets - 10 reps   Seated Finger Composite Flexion Extension - 2-3 x daily - 7 x weekly - 2 sets - 10 reps   Seated Finger Composite Flexion Stretch - 2-3 x daily - 7 x weekly - 2 sets - 10 reps  -active hook fist while grasping highlighter    Patient Education:   Education Content:   see above, discomfort but not pain expected with exercises, nighttime orthosis , desensitization, digi-sleeve, bending every other finger   Education Delivery Method:   verbal, written and demonstration   Education Response:   verbalizes understanding and demonstrates understanding    ASSESSMENT: patient with gains for flexion, no loss of extension since last visit. No difficulties with pickup activities but unable to isolate movement of every other finger for exercise noted above. Continue occupational therapy to prevent further extension lag and improve overall range of motion and functional skill performance.    PLAN: review exercise bending every other digit, coban wrap with paraffin distal interphalangeal, continue to monitor range of motion, pinch and grip strengthening    Patient was seen for a total of 45 minutes of treatment time.  45 minutes was in services represented by timed CPT codes.    Tricia Pledger Joylene Igo, COTA/L

## 2020-12-03 ENCOUNTER — Ambulatory Visit: Payer: MEDICAID

## 2020-12-03 NOTE — Progress Notes
PATIENT: Dustin Watts  MRN: [id number]  DOB: 11-13-1955  DATE OF SERVICE: 12/03/2020    SURGERY ATTENDING PROVIDER: Dr. Mordecai Maes    Reason for Consultation: Umbilical Hernia    HPI: Dustin Watts is a 65 y.o. male with a history of HTN, Cerebral palsy, perdiabetes, epilepsy presenting for annual follow up of umbilical hernia which is currently being observed. The patient initially became aware of his hernia when he presented to an OSH in 2021 for separate chief complaint, was told that he had an umbilical hernia and subsequently referred to [facility] general surgery. He was seen in surgery clinic a year ago and at the time he was hesitant to pursue surgical repair but wanted to be followed in case symptoms worsen. In the past year, he has had no significant episodes of pain, discomfort, or episodes of bowel obstruction or incarceration. He has been trying to lose weight under the encouragement of others and remains hesitant towards hernia repair.     Past Medical History    Past Medical History:   Diagnosis Date   ??? Acne    ??? Arthritis    ??? Cerebral palsy (HCC/RAF)    ??? Eczema    ??? Hypertension    ??? Impaired glucose tolerance 03/23/2016    HGBA1C of 5.7 on 03/19/2016    ??? Low vitamin D level 05/07/2016    05/07/2016 - taking antiepiletpic which may exacerbate it, started on ergo 50000, will recheck level in about 2 months    ??? Overweight    ??? Seizure (HCC/RAF) 1973    chronically on Dilantin, no seizure in 25 years, followed by Neurologist (Dr. Wiliam Ke) at [facility]   ??? Shortening of arm, congenital     right arm       Past Surgical History    Past Surgical History:   Procedure Laterality Date   ??? rectal abscess  2010    surgery done at [facility] the [facility]       Family History    Family History   Problem Relation Age of Onset   ??? Heart disease Mother    ??? Diabetes Mother    ??? Kidney disease Mother    ??? Stroke Brother    ??? Colon cancer Neg Hx    ??? Prostate cancer Neg Hx    ??? Ovarian cancer Neg Hx    ??? Breast cancer Neg Hx    ??? Hyperlipidemia Neg Hx    ??? Hypertension Neg Hx        Social History    Social History     Socioeconomic History   ??? Marital status: Married     Spouse name: Not on file   ??? Number of children: 0   ??? Years of education: Not on file   ??? Highest education level: Not on file   Occupational History   ??? 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 Smoking status: Never Smoker   ??? Smokeless tobacco: Never Used   Vaping Use   ??? Vaping Use: Never used   Substance and Sexual Activity   ??? Alcohol use: No   ??? Drug use: No   ??? Sexual activity: Not Currently     Partners: Female     Comment: never married (incorrectly documented in Epic)   Other Topics Concern   ??? Do you exercise at least a day, 3 or more days a  week? No   ??? Types of Exercise? (List in Comments) Yes     Comment: biking infrequently, abdominal exercise   ??? Do you follow a special diet? No   ??? Vegan? No   ??? Vegetarian? No   ??? Pescatarian? No   ??? Lactose Free? No   ??? Gluten Free? No   ??? Omnivore? Yes   Social History Narrative   ??? Not on file     Social Determinants of Health     Physical Activity: Not on file   Stress: Not on file   Financial Resource Strain: Not on file       Medications    Current Outpatient Medications   Medication Sig   ??? ASPIRIN LOW DOSE 81 MG EC tablet TAKE 1 TABLET BY MOUTH EVERY DAY   ??? ATORVASTATIN 40 mg tablet TAKE 1 TABLET BY MOUTH EVERY DAY   ??? benzoyl peroxide-erythromycin 5-3% gel Apply to acne once daily.Marland Kitchen   ??? Calcium Carb-Cholecalciferol (OYSTER SHELL CALCIUM W/D) 500-200 MG-UNIT TABS    ??? Calcium Carbonate-Vitamin D (OYSTER SHELL CALCIUM + VITAMIN D) 500-200 mg-unit TABS tablet TAKE 1 TABLET BY MOUTH 3 TIMES A DAY   ??? clindamycin 300 mg capsule TAKE 2 CAPSULES ORALLY EVERY 8 HOURS   ??? DILANTIN 100 MG ER capsule TAKE 4 CAPSULES ORALLY AT BEDTIME   ??? IBUPROFEN 800 mg tablet TAKE 1 TABLET (800 MG TOTAL) BY MOUTH DAILY AS NEEDED.   ??? oxyCODONE 5 mg tablet Take 1 tablet (5 mg total) by mouth two (2) times daily as needed. Max Daily Amount: 10 mg   ??? RETIN-A MICRO PUMP 0.04 % gel Apply topically at bedtime.   ??? sulfacetamide-sulfur 9-4.5 % LIQD liquid Apply to face twice daily.   ??? Tretinoin Microsphere (RETIN-A MICRO PUMP) 0.06 % GEL APPLY PEA SIZED AMOUNT TOPICALLY TO THE FACE NIGHTLY.     No current facility-administered medications for this visit.       Allergies    Topiramate, Sulfa antibiotics, Amlodipine, Carvedilol, Losartan, and Penicillins      Review of Systems:  A full 14 point review of systems was performed and is negative except as above in the HPI.    Physical Exam:  Vitals:BP 151/95  ~ Pulse (!) 103  ~ Temp 36.6 ???C (97.9 ???F) (Tympanic)  ~ Resp 16  ~ Ht 6' (1.829 m)  ~ Wt (!) 226 lb 6.4 oz (102.7 kg)  ~ SpO2 98%  ~ BMI 30.71 kg/m???  Body mass index is 30.71 kg/m???.  CONSTITUTIONAL: well developed, well nourished, no acute distress  RESPIRATORY: clear to auscultation, no distress  CV: RRR  ABD: soft, nontender, nondistended, palpable, non-reducible umbilical hernia without prior incisions  EXT: Warm, well-perfused  NEURO: A&O x 4  SKIN: Dry, no rash  PSYCH: Normal mood/affect    Lab Review:  No results found for this or any previous visit (from the past 24 hour(s)).    Imaging:  No imaging has been resulted in the last 24 hours    Assessment and Plan:     Dustin Watts is a 65 y.o. 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 BMI 30.71 today, he continues to try and lose weight. We discussed the rationale, risks, benefits and alternatives of surgery and patient has elected to continue non-operative management since his symptoms from the hernia are minimal at this point. We gave return precautions and patient will  schedule follow up with [place] in 6 months to 1 year to revisit discussion for surgery    The plan for this patient has been discussed with surgery team and attending physician Dr. Cecile Hearing Homero Fellers) Reynolds Bowl, MD  PGY-5 [facility] General Surgery Resident  12/03/2020 2:47 PM

## 2020-12-05 ENCOUNTER — Ambulatory Visit: Payer: MEDICAID

## 2020-12-05 NOTE — Treatment Summary
OCCUPATIONAL THERAPY TREATMENT NOTE    PATIENT: Dustin Watts  GENDER: male  AGE: 65 y.o.  DOB: 1956/07/29  MRN: [id number]    Diagnosis:     ICD-10-CM    1. Closed dislocation of interphalangeal joint of finger of left hand, initial encounter  S63.279A    2. Avulsion fracture of middle phalanx of finger, closed, initial encounter  S62.629A      Medicare:    Visit number: 8 (next report due visit 10)    Plan of Care Expires: 01/17/2021  Authorization expires:  Medical necessity     Precautions: none    Pain: No    SUBJECTIVE: patient reports the new digi sleeve is feeling loose, yesterday at the tip of his finger felt a pinching sensation but wasn't currently wearing any sleeve or orthosis. It lasted about 15 minutes, this occurred after he showered    OBJECTIVE:  - the oedema in his finger looks less than last visit, the patient agrees  - updated active range of motion measurements  - dipped left hand in paraffin wrapped in fist with coban for 5 minutes to increase tissue extensibility  - removed, pressing highlighter held in hook fist into yellow putty x8  - grade II and III mobilizations to distal interphalangeal joint of ring finger  - repeated hook fist putty presses then mobilizations  - digi sleeve still providing some compression, advised continuing to wear for now  - reinforced desensitization      9 hole peg test 11/28/2020  23 seconds left    - indicates lack of extension, + indicates hyperextension  Ring finger active range of motion 12/05/20  Metacarpal phalangeal (MCP) joint extension/flexion Right 81   Proximal interphalangeal (PIP) joint extension/flexion Right -17/89    Distal interphalangeal (DIP) joint extension/flexion Right 39     GRIP (lbs) 2nd rung position 1/10  Right:    Left: 39, 34 (pain)     Access Code: [email or web address] URL: [email or web address]  ??? Seated Finger DIP Flexion AROM with Blocking - 2-3 x daily - 7 x weekly - 2 sets - 10 reps   ??? Finger PIP Flexion Extension with Blocking - 2-3 x daily - 7 x weekly - 2 sets - 10 reps   ??? Seated Finger Composite Flexion Extension - 2-3 x daily - 7 x weekly - 2 sets - 10 reps   ??? Seated Finger Composite Flexion Stretch - 2-3 x daily - 7 x weekly - 2 sets - 10 reps  -active hook fist while grasping highlighter    Patient Education:   Education Content:   see above, discomfort but not pain expected with exercises, nighttime orthosis , desensitization, digi-sleeve, bending every other finger   Education Delivery Method:   verbal, written and demonstration   Education Response:   verbalizes understanding and demonstrates understanding    ASSESSMENT: No loss of range of motion in metacarpal phalangeal and proximal interphalangeal joints but patient presenting with a stiff distal interphalangeal joint. Did not take post treatment measurement but it appeared to have more flexion after mobilizations and exercises today. Patient with good potential to further improve range of motion under guidance of occupational therapy.    PLAN: review exercise bending every other digit, coban wrap with paraffin distal interphalangeal, continue to monitor range of motion, pinch and grip strengthening    Patient was seen for a total of 30 minutes of treatment time.  25 minutes was in services represented by timed CPT codes.  Jupiter Boys Marie Ilma Achee, COTA/L

## 2020-12-10 DIAGNOSIS — K429 Umbilical hernia without obstruction or gangrene: Secondary | ICD-10-CM

## 2020-12-12 ENCOUNTER — Ambulatory Visit: Payer: BLUE CROSS/BLUE SHIELD

## 2020-12-12 NOTE — Treatment Summary
OCCUPATIONAL THERAPY TREATMENT NOTE    PATIENT: Dustin Watts  GENDER: male  AGE: 65 y.o.  DOB: Mar 15, 1956  MRN: [id number]    Diagnosis:     ICD-10-CM    1. Closed dislocation of interphalangeal joint of finger of left hand, initial encounter  S63.279A    2. Avulsion fracture of middle phalanx of finger, closed, initial encounter  S62.629A      Medicare:    Visit number: 9 (next report due visit 10)    Plan of Care Expires: 01/17/2021  Authorization expires:  Medical necessity     Precautions: none    Pain: No    SUBJECTIVE: Patient left finger gutter orthosis next to a sunny window, which distorted its shape. Currently not able wear it due to significant deformation.     OBJECTIVE:    -objective measurements updated  -LMB orthosis adjusted (straightened to create more tension into extension)  -grip strengthening with beige theraputty (issued for home program)  -proximal interphalangeal extension stretch with yellow theraputty   -hot pack application to left hand while fabricating replacement orthosis for finger gutter/extension (due to severe deformity of previous orthosis from prolonged heat/sun exposure)     9 hole peg test 11/28/2020  23 seconds left    - indicates lack of extension, + indicates hyperextension  Ring finger active range of motion 12/12/20  Metacarpal phalangeal (MCP) joint extension/flexion Right 72   Proximal interphalangeal (PIP) joint extension/flexion Right -17/82   Distal interphalangeal (DIP) joint extension/flexion Right 40     GRIP (lbs) 2nd rung position 1/10  Right:    Left: 39, 34 (pain)     Access Code: [email or web address] URL: [email or web address]  ??? Seated Finger DIP Flexion AROM with Blocking - 2-3 x daily - 7 x weekly - 2 sets - 10 reps   ??? Finger PIP Flexion Extension with Blocking - 2-3 x daily - 7 x weekly - 2 sets - 10 reps   ??? Seated Finger Composite Flexion Extension - 2-3 x daily - 7 x weekly - 2 sets - 10 reps   ??? Seated Finger Composite Flexion Stretch - 2-3 x daily - 7 x weekly - 2 sets - 10 reps  -active hook fist while grasping highlighter  -grip strengthening with beige theraputty (1 minute x 3 sessions daily)    Patient Education:   Education Content:   see above, discomfort but not pain expected with exercises, nighttime orthosis , desensitization, digi-sleeve, bending every other finger   Education Delivery Method:   verbal, written and demonstration   Education Response:   verbalizes understanding and demonstrates understanding    ASSESSMENT: Patient verbalized satisfaction with comfort of new finger gutter orthosis and verbalized understanding of wear/care of orthosis in addition to monitoring skin integrity. Patient was reminded to keep it away from warm areas. Patient tolerated theraputty squeezes well as well as all other intervention today.     PLAN: follow up on fit and function of new finger orthosis, review exercise bending every other digit, coban wrap with paraffin distal interphalangeal, continue to monitor range of motion, pinch and grip strengthening    Patient was seen for a total of 40 minutes of treatment time.  15 minutes was in services represented by timed CPT codes.    Zaray Gatchel J Krishauna Schatzman, OT/L

## 2020-12-19 ENCOUNTER — Ambulatory Visit: Payer: PRIVATE HEALTH INSURANCE

## 2020-12-19 NOTE — Treatment Summary
OCCUPATIONAL THERAPY PROGRESS NOTE AND TREATMENT NOTE  Reporting period: 09/16/21- 12/19/20    PATIENT: Dustin Watts  GENDER: male  AGE: 65 y.o.  DOB: 06-Feb-1956  MRN: [id number]    Diagnosis:     ICD-10-CM    1. Closed dislocation of interphalangeal joint of finger of left hand, initial encounter  S63.279A    2. Avulsion fracture of middle phalanx of finger, closed, initial encounter  S62.629A      Medicare:    Visit number: 10 (next report due visit 20)    Plan of Care Expires: 03/13/2021  Authorization expires:  Medical necessity     Precautions: none    Pain:Yes   Pain In: 1-2/10  Pain Out: 1-2/10  Location: left 4th digit (volar and ulnar aspects)  Duration: Intermittent  Description: Aching      SUBJECTIVE: Patient reports orthosis fabricated last week is comfortable. 4th digit pain mainly at night, but mild pain persists during the day.    OBJECTIVE:    -objective measurements updated  -hot pack application to left hand while stretching 4th digit in flexion using coban. Patient educated on how to perform this coban wrap - to be done 3x/day for 15 minutes.  -instrument assisted soft tissue mobilization of 4th digit volar/dorsal aspects  -Ultrasound performed at volar/ulnar aspect of 4th digit proximal interphalangeal joint 0.6 w/cm2 x x 50% pulsed for a total of 6 minutes.    -composite flexion stretch of 3rd digit  -grade II Joint mobilization of 3rd proximal interphalangeal joint         9 hole peg test 11/28/2020  23 seconds left    - indicates lack of extension, + indicates hyperextension  Ring finger active range of motion 12/19/20  Metacarpal phalangeal (MCP) joint extension/flexion Right 75   Proximal interphalangeal (PIP) joint extension/flexion Right -13/77   Distal interphalangeal (DIP) joint extension/flexion Right 36     GRIP (lbs) 2nd rung position 12/19/20  Right:    Left: 39     Access Code: [email or web address] URL: [email or web address]  ??? Seated Finger DIP Flexion AROM with Blocking - 2-3 x daily - 7 x weekly - 2 sets - 10 reps   ??? Finger PIP Flexion Extension with Blocking - 2-3 x daily - 7 x weekly - 2 sets - 10 reps   ??? Seated Finger Composite Flexion Extension - 2-3 x daily - 7 x weekly - 2 sets - 10 reps   ??? Seated Finger Composite Flexion Stretch - 2-3 x daily - 7 x weekly - 2 sets - 10 reps  -active hook fist while grasping highlighter  -grip strengthening with beige theraputty (1 minute x 3 sessions daily)    Patient Education:   Education Content:   see above, discomfort but not pain expected with exercises, nighttime orthosis , desensitization, digi-sleeve, bending every other finger   Education Delivery Method:   verbal, written and demonstration   Education Response:   verbalizes understanding and demonstrates understanding      ASSESSMENT: See below assessment section     PLAN: follow up on coban wrap for finger flexion, review exercise bending every other digit, continue to facilitate proximal interphalangeal extension and distal interphalangeal/ proximal interphalangeal flexsion, pinch and grip strengthening    Patient was seen for a total of 30 minutes of treatment time.  30 minutes was in services represented by timed CPT codes.    Dustin Watts, OT/L    Prior Level of Function:  Difficulty performing  self care, tying shoes, unable to cook/clean and grasp/hold objects   ???  Current Level of Function:  Able to perform self care with left upper extremity, able to do light cleaning and cooking, difficulty sleeping some nights due to pain at 4th digit    Problem List:  Decreased range of motion, Decreased strength, Increased edema and Increased pain  Decreased tolerance for keyboard / computer use, Limitations in ability to handle groceries, Limitations in ability to perform driving related tasks, Limitations in ability to perform fine motor activities associated with dressing, Limitations in ability to perform household tasks , Limitations in handling utensils, Limitations in meal preparation, Limitations in opening jars / bottles   ???  Goals:  Short Term Goal:  Patient will learn and implement at least three techniques to decrease finger swelling for ease in grasping objects in 2 sessions. Met   Patient will increase left 4th metacarpophalangeal joint active flexion by at least 30 degrees for ease in performing dressing tasks in 6 weeks. Met  Patient will sleep throughout night with no more than 1-2/10 pain in 6 weeks. Not Met (currently at 5/10)  ???  Long Term Goal:  Patient will demonstrate at least 20-25 pounds of grip strength for ease in opening jars/bottles in 12 weeks. Met   Patient will perform all self care with modified independence - independence in 12 weeks. Met  Patient will sleep sans finger pain in 12 weeks. Not Met   ???  ASSESSMENT:  65 year old male presents with referral diagnosis of Closed dislocation of interphalangeal joint of finger of left hand, initial encounter  Avulsion fracture of middle phalanx of finger, closed, initial encounter since 08/15/20.  ???  Patient has met 4/6 short and long term goals and is making significant gains in activity tolerance. Patient able to perform self care without limitation, although he continues to have moderate pain in 4th digit at night time. Stiffness in proximal interphalangeal and distal interphalangeal joint in flexion, although overall, he has made good progress in range of motion here. Moderate swelling at proximal interphalangeal joint and tenderness at volar plate.  Functional limitations of right upper extremity due to cerebral palsy as well as left digit injury restrict patient's ability to perform some daily tasks including gripping/grasping objects. Strong gains made in left grip strength and patient tolerates putty squeezes well.   ???  Symptoms are in the chronic phase with mild to moderate???irritability.   ???  The patient's primary impairment(s) of left finger include decreased range of motion, decreased grip and pinch strength, increased pain, increased inflammation, decreased dexterity and decreased coordination which is limiting the ability to perform sleep, perform meal preparation, complete work activities, complete fitness/wellness routine, perform household tasks and participate in hobbies.???Refer to problem list.   ???  The patient requires skilled Occupational Therapy that can be safely and effective performed only by a qualified therapist to address the above deficits.  ???  Dustin Watts, OT  ???  ???  ???  ???  ???  ???  REHAB POTENTIAL: Good  ???  OBSTACLES TO REHABILITATION/CO-MORBIDITIES THAT MAY AFFECT GOALS AND TREATMENT PLAN: Functional limitations of right upper extremity due to cerebral palsy  ???  Plan:   INTERVENTIONS:   Therapeutic Exercise, Manual Therapy, Neuromuscular Re-education, Therapeutic Activities, Ultrasound, Traction, Electrical Stimulation, Iontophoresis, Paraffin, Heat Therapy, Cold Therapy, Patient/Caregiver Education, Self Care Training and Light Therapy, [id number], [id number], [id number]  ???  Patient to be seen 1-2 times per week for 12  week(s).  ???  Patient agrees with goals and treatment plan as outlined above:Yes

## 2020-12-26 ENCOUNTER — Ambulatory Visit: Payer: MEDICAID

## 2020-12-26 NOTE — Treatment Summary
OCCUPATIONAL THERAPY TREATMENT NOTE      PATIENT: Dustin Watts  GENDER: male  AGE: 65 y.o.  DOB: November 11, 1955  MRN: [id number]    Diagnosis:     ICD-10-CM    1. Closed dislocation of interphalangeal joint of finger of left hand, initial encounter  S63.279A    2. Avulsion fracture of middle phalanx of finger, closed, initial encounter  S62.629A      Medicare:    Visit number: 11 (next report due visit 20)    Plan of Care Expires: 03/13/2021  Authorization expires:  Medical necessity     Precautions: none    Pain: No    SUBJECTIVE: Patient has been performing coban wrap for 4th digit proximal interphalangeal and distal interphalangeal flexion once a day for 30 minutes.     OBJECTIVE:    -objective measurements updated  -paraffin for left hand with coban to stretch 4th digit distal interphalangeal and proximal interphalangeal joints.  -instrument assisted soft tissue mobilization of 4th digit volar/dorsal aspects  -Ultrasound performed at volar/ulnar aspect of 4th digit proximal interphalangeal joint 0.6 w/cm2 x x 50% pulsed for a total of 6 minutes.    -composite flexion stretch of 4rd digit   -grade II Joint mobilization of 4rd proximal interphalangeal joint   -volar/dorsal gliding of proximal interphalangeal joint with flexion/extension stretch respectively      9 hole peg test 11/28/2020  23 seconds left    - indicates lack of extension, + indicates hyperextension  Ring finger active range of motion 12/19/20  Metacarpal phalangeal (MCP) joint extension/flexion Right 78   Proximal interphalangeal (PIP) joint extension/flexion Right -8/81   Distal interphalangeal (DIP) joint extension/flexion Right 43     GRIP (lbs) 2nd rung position 12/19/20  Right:    Left: 39     Access Code: [email or web address] URL: [email or web address]  ??? Seated Finger DIP Flexion AROM with Blocking - 2-3 x daily - 7 x weekly - 2 sets - 10 reps   ??? Finger PIP Flexion Extension with Blocking - 2-3 x daily - 7 x weekly - 2 sets - 10 reps   ??? Seated Finger Composite Flexion Extension - 2-3 x daily - 7 x weekly - 2 sets - 10 reps   ??? Seated Finger Composite Flexion Stretch - 2-3 x daily - 7 x weekly - 2 sets - 10 reps  -active hook fist while grasping highlighter  -grip strengthening with beige theraputty (1 minute x 3 sessions daily)    Patient Education:   Education Content:   see above, discomfort but not pain expected with exercises, nighttime orthosis , desensitization, digi-sleeve, bending every other finger   Education Delivery Method:   verbal, written and demonstration   Education Response:   verbalizes understanding and demonstrates understanding      ASSESSMENT: Improvement in 3rd digit extension (proximal interphalangeal joint) and composite flexion since last week. Patient responds well to stretches and joint mobilizations. Pain level has decreased overall as well although night time aching persists. Patient instructed to use heat pack or perform warm water soak with left hand before going to bed for pain management.     PLAN: follow up on grip strengthening, continue to facilitate proximal interphalangeal extension and distal interphalangeal/ proximal interphalangeal flexsion, pinch and grip strengthening    Patient was seen for a total of 30 minutes of treatment time. 25 minutes was in services represented by timed CPT codes.    Randeep Biondolillo J Virna Livengood, OT/L

## 2021-01-02 ENCOUNTER — Ambulatory Visit: Payer: BLUE CROSS/BLUE SHIELD

## 2021-01-03 ENCOUNTER — Ambulatory Visit: Payer: MEDICAID

## 2021-01-03 DIAGNOSIS — K429 Umbilical hernia without obstruction or gangrene: Secondary | ICD-10-CM

## 2021-01-03 NOTE — Progress Notes
[facility] of [facility]opaedic [facility]  Division of Sports Medicine    Sports Medicine Follow-Up Visit      Date: 01/06/2021  Primary Care Physician:  Murtis Sink., MD, MPH    Chief Complaint: Left hand 4th PIP dislocation + avulsion fracture, 6 weeks post-injury    Interval Events:  Dustin Watts is a 65 y.o. male presenting for follow-up of left hand 4th PIP dislocation + avulsion fracture.  Since his last appointment, he reports some pain and weakness in the 4th finger. He still experiences pain upon palpation at the site of the fracture as well as the dislocation. He reports that he is still in physical therapy and notes improvement in pain and strength.      Past Medical History:  Past Medical History:   Diagnosis Date   ??? Acne    ??? Arthritis    ??? Cerebral palsy (HCC/RAF)    ??? Eczema    ??? Hypertension    ??? Impaired glucose tolerance 03/23/2016    HGBA1C of 5.7 on 03/19/2016    ??? Low vitamin D level 05/07/2016    05/07/2016 - taking antiepiletpic which may exacerbate it, started on ergo 50000, will recheck level in about 2 months    ??? Overweight    ??? Seizure (HCC/RAF) 1973    chronically on Dilantin, no seizure in 25 years, followed by Neurologist (Dr. Wiliam Ke) at [facility]   ??? Shortening of arm, congenital     right arm       Past Surgical History:  Past Surgical History:   Procedure Laterality Date   ??? rectal abscess  2010    surgery done at [facility]       Medications:  Outpatient Medications Prior to Visit   Medication Sig Dispense Refill   ??? ASPIRIN LOW DOSE 81 MG EC tablet TAKE 1 TABLET BY MOUTH EVERY DAY 90 tablet 3   ??? ATORVASTATIN 40 mg tablet TAKE 1 TABLET BY MOUTH EVERY DAY 90 tablet 3   ??? benzoyl peroxide-erythromycin 5-3% gel Apply to acne once daily.. 46.6 g 11   ??? Calcium Carb-Cholecalciferol (OYSTER SHELL CALCIUM W/D) 500-200 MG-UNIT TABS      ??? Calcium Carbonate-Vitamin D (OYSTER SHELL CALCIUM + VITAMIN D) 500-200 mg-unit TABS tablet TAKE 1 TABLET BY MOUTH 3 TIMES A DAY ??? clindamycin 300 mg capsule TAKE 2 CAPSULES ORALLY EVERY 8 HOURS     ??? DILANTIN 100 MG ER capsule TAKE 4 CAPSULES ORALLY AT BEDTIME     ??? IBUPROFEN 800 mg tablet TAKE 1 TABLET (800 MG TOTAL) BY MOUTH DAILY AS NEEDED. 30 tablet 0   ??? oxyCODONE 5 mg tablet Take 1 tablet (5 mg total) by mouth two (2) times daily as needed. Max Daily Amount: 10 mg 20 tablet 0   ??? RETIN-A MICRO PUMP 0.04 % gel Apply topically at bedtime. 50 g 11   ??? sulfacetamide-sulfur 9-4.5 % LIQD liquid Apply to face twice daily. 454 g 11   ??? Tretinoin Microsphere (RETIN-A MICRO PUMP) 0.06 % GEL APPLY PEA SIZED AMOUNT TOPICALLY TO THE FACE NIGHTLY. 50 g 0     No facility-administered medications prior to visit.       Allergies:   Allergies   Allergen Reactions   ??? Topiramate      Eye swelling   ??? Sulfa Antibiotics Angioedema     No anaphylaxis   ??? Amlodipine Other (See Comments)     Lip swelling without  anaphylaxis   ??? Carvedilol Angioedema     Lip swelling without anaphylaxis   ??? Losartan Rash     No hive, no anaphylaxis, unclear if truly has rash from this medicine   ??? Penicillins Rash     ''bumps on body''       Review of Systems: A 14-point review of systems was performed.  With the exception of the HPI, all other review of systems was negative.     PHYSICAL EXAM:     Vitals: There were no vitals taken for this visit.  General: NAD, pleasant & cooperative  Head: EOMI, no facial lesions  Left Hand:  Inspection: Skin: intact, Swelling: moderate over the 4th PIP, Deformity: none  Palpation: Warmth: no warmth, Tenderness: moderate over the volar 4th PIP  ROM: 5 degree flexion, 2 CM from palm extension, tender over volar plate.  Strength: normal  Neurologic: normal sensation and reflexes  Vascular: normal, good capillary refill, good color and radial pulse present  Neuro: Normal sensation, no motor deficits  Skin: No ulcers, erythema, or skin breakdown  Psych: Alert, appropriate affect    IMAGING:     XR Left Finger (01/06/2021):  Interval callus formation around the base of the middle phalanx.    XR Left Finger (09/03/2020):  A small calcification by the proximal phalanx head has nearly resorbed.  A small fracture at the base of the middle phalanx is unchanged in alignment.  Minimal callus is seen.  The finger is normally aligned.    IMPRESSION:     Dustin Watts is a 65 y.o. male who presents for follow-up of left hand 4th PIP dislocation + avulsion fracture, 6 weeks post-injury.  Overall, he feels the pain is gradually improving.    RECOMMENDATIONS & PLAN:     Diagnoses and all orders for this visit:    Closed dislocation of interphalangeal joint of left hand  Closed avulsion fracture of middle phalanx of finger with routine healing  - Continue buddy taping for one month.  - Emphasized importance of daily PIP range of motion exercises to prevent stiffness.  - Continue OT.    -Repeat X-Ray at return visit to determine proper healing of fracture.    Follow-Up: 4-6 weeks     The above recommendations were communicated to the patient who expressed understanding.    SCRIBE ATTESTATION:     I, Hewitt Shorts, was the scribe for patient Dustin Watts on 01/06/2021 at 7:33 PM for Sofie Rower, MD.    PHYSICIAN SIGNATURE:     Sofie Rower, MD, MBA  Division of Sports Medicine & Non-Operative Orthopedics  Department of Orthopaedic Surgery  [facility]

## 2021-01-03 NOTE — Progress Notes
PATIENT: Dustin Watts  MRN: [id number]  DOB: 05-10-1956  DATE OF SERVICE: 01/03/2021    CHIEF COMPLAINT:   Chief Complaint   Patient presents with   ??? Follow-up     Foot        History of Present Illness  Dustin Watts is a 65 y.o. male presents for:     Foot lump  - R foot  - upper   - soft swelling  - drained last month  - recurred after 1 day  - mildly painful  - no rash  - no bleeding    Finger injury  - from fall last year, summer  - 4th digit of L hand  - seeing OT  - has upcoming eval with surgeon  - using finger brace  - still mild pain      Past Medical History:   Diagnosis Date   ??? Acne    ??? Arthritis    ??? Cerebral palsy (HCC/RAF)    ??? Eczema    ??? Hypertension    ??? Impaired glucose tolerance 03/23/2016    HGBA1C of 5.7 on 03/19/2016    ??? Low vitamin D level 05/07/2016    05/07/2016 - taking antiepiletpic which may exacerbate it, started on ergo 50000, will recheck level in about 2 months    ??? Overweight    ??? Seizure (HCC/RAF) 1973    chronically on Dilantin, no seizure in 25 years, followed by Neurologist (Dr. Wiliam Ke) at [facility]   ??? Shortening of arm, congenital     right arm       Medications that the patient states to be currently taking   Medication Sig   ??? ASPIRIN LOW DOSE 81 MG EC tablet TAKE 1 TABLET BY MOUTH EVERY DAY   ??? benzoyl peroxide-erythromycin 5-3% gel Apply to acne once daily.Marland Kitchen   ??? Calcium Carb-Cholecalciferol (OYSTER SHELL CALCIUM W/D) 500-200 MG-UNIT TABS    ??? clindamycin 300 mg capsule TAKE 2 CAPSULES ORALLY EVERY 8 HOURS   ??? DILANTIN 100 MG ER capsule TAKE 4 CAPSULES ORALLY AT BEDTIME   ??? IBUPROFEN 800 mg tablet TAKE 1 TABLET (800 MG TOTAL) BY MOUTH DAILY AS NEEDED.   ??? oxyCODONE 5 mg tablet Take 1 tablet (5 mg total) by mouth two (2) times daily as needed. Max Daily Amount: 10 mg   ??? RETIN-A MICRO PUMP 0.04 % gel Apply topically at bedtime.   ??? Tretinoin Microsphere (RETIN-A MICRO PUMP) 0.06 % GEL APPLY PEA SIZED AMOUNT TOPICALLY TO THE FACE NIGHTLY.       Physical Exam  Last Recorded Vital Signs:    01/03/21 0947   BP: 152/95   Pulse: 96   Resp: 18   Temp: (!) 35.9 ???C (96.6 ???F)   SpO2: 97%     Wt Readings from Last 3 Encounters:   01/03/21 (!) 225 lb 3.2 oz (102.2 kg)   12/03/20 (!) 226 lb 6.4 oz (102.7 kg)   11/22/20 (!) 226 lb 9.6 oz (102.8 kg)     Body mass index is 30.54 kg/m???.  System Check if normal Positive or additional negative findings   Constit  [x]  General appearance     Eyes  []  Conj/Lids []  Pupils  []  Fundi     HENMT  []  External ears/nose []  TMs (bilateral)   []  Gross Hearing []  Nasal mucosa   []  Lips/teeth/gums []  Oropharynx    []  Mucus membranes []  Head     Neck  []   Inspection/palpation []  Thyroid     Resp  [x]  Effort []  No wheezing    []  Auscultation  []  No crackles     CV  []  Rhythm/rate []  Murmurs []  LEE    []  JVP non-elevated []  Radial pulses     Breast  []  Inspection []  Palpation     GI  []  No abd masses  []  No tenderness   []  No rebound/guarding  []  Liver/spleen  non reducible and nontender ubilical hernia   GU  []  Scrotum []  Penis []  Prostate      Lymph  Normal: []  Neck []  Axillae []  Groin     MSK site examined:    []  Inspect/palp []  ROM []  Strength/tone   5cm soft mobile lump on the upper R foot without erythema or tenderness     Skin  []  Inspection []  Palpation  Site:     Neuro  []  CN2-12 intact grossly   []  Alert and oriented   []  Muscle strength - legs     []  Sensation  []  Gait/balance     Psych  []  Insight/judgement  []  Mood/affect        I have:   [x]  Reviewed/ordered []  1 []  2 [x]  ? 3 unique laboratory, radiology, and/or diagnostic tests noted below       Lab Studies:  Lab Results   Component Value Date    NA 139 11/22/2020    K 4.5 11/22/2020    CL 102 11/22/2020    CO2 22 11/22/2020    BUN 9 11/22/2020    CREAT 0.46 (L) 11/22/2020    GLUCOSE 105 (H) 11/22/2020    CALCIUM 9.2 11/22/2020    ALT 18 11/22/2020    AST 22 11/22/2020    ALKPHOS 97 11/22/2020    BILITOT 0.2 11/22/2020     Lab Results   Component Value Date    WBC 5.45 11/22/2020 HGB 14.4 11/22/2020    HCT 43.6 11/22/2020    MCV 101.4 (H) 11/22/2020    PLT 211 11/22/2020     Lab Results   Component Value Date    HGBA1C 6.0 (H) 11/22/2020     Lab Results   Component Value Date    TSH 1.5 01/21/2017     Lab Results   Component Value Date    CHOL 279 11/22/2020    CHOLHDL 59 11/22/2020    CHOLDLCAL 174 (H) 11/22/2020    TRIGLY 231 (H) 11/22/2020     2018 ACC/AHA guidelines recommends moderate or high-intensity statin because 10-year ASCVD risk>=7.5%. Patient is receiving guideline-directed statin therapy; monitor adherence.  10-Year ASCVD risk is 15.8% Continue moderate or high-intensity statin therapy. Monitor adherence as of 10:32 AM on 01/03/2021.  10-Year ASCVD risk with optimal risk factors is 6.1%.  Values used to calculate ASCVD score:  Age: 65 y.o.   Gender: Male Race: Other Race  HDL cholesterol: 59 mg/dL. HDL cholesterol measured on 11/22/2020.  Total cholesterol: 279 mg/dL. Total cholesterol measured on 11/22/2020.  LDL cholesterol: 174 mg/dL. LDL cholesterol measured on 11/22/2020.  Systolic BP: 152 mm Hg. BP was measured on 01/03/2021.  The patient is not being treated with a medication that influences SBP.  The patient is currently not a smoker.  The patient does not have a diagnosis of diabetes.      Assessment and Plan       ICD-10-CM    1. Skin lump of leg, right  R22.41    2. Closed dislocation of interphalangeal  joint of finger of left hand, initial encounter  S63.279A    3. Elevated cholesterol with elevated triglycerides  E78.2    4. Prediabetes  R73.02    5. Umbilical hernia without obstruction and without gangrene  K42.9    6. White coat syndrome without hypertension  R03.0    7. Mild anemia  D64.9    8. Overweight  E66.3            Problem   White Coat Syndrome Without Hypertension    HTN diagnosis in the past but likely was only white coat HTN as home readings consistently reassuring and home cuff has correlated with office measurement on 12/02/2017, lip swelling with carvedilol and amlodipine, rash with losartan, sulfa allergy with HCTZ.  09/08/2018 - again home records reassuring with elevation in the office.  Good correlation between measurements on home BP cuff today and our automated cuff.  Therefore very unlikely to have HTN, but rather [facility]  01/03/2021 - elevated BP continues on office measurements     Closed Dislocation of Interphalangeal Joint of Finger of Left Hand, Initial Encounter    09/20/2020 - seeing OT, wearing brace, to f/u with ortho, continue supportive care for now.  01/03/2021 - improvement but still with some pain, continue with OT every other week, using brace     Skin Lump of Leg, Right    08/23/2020 - 5cm soft tissue nodule/lump   11/22/2020 - US showed 5cm fluid collection, drained today with 18 gauge needle without complication producing mildly serosanguinous fluid  01/03/2021 - recurred, drained 10ml with 23 gauge needle without complication, serosanguinous fluid, f/u if recurs again, will consider surgical eval if persistently recurring.     Umbilical Hernia    05/27/2017 - reducible  10/07/2017 - counseled on indications for additional imaging or surgical management  07/08/2018- stable, no treatment at this time as rarely symptoms with only mild discomfort  01/03/2021 - eval wit surgery last month: ''patient has elected to continue non-operative management since his symptoms from the hernia are minimal at this point. We gave return precautions and patient will schedule follow up with [place] in 6 months to 1 year to revisit discussion for surgery''     Elevated Cholesterol With Elevated Triglycerides    01/21/2017 - ASCVD score about 10%, patient declines medicine, counseled on lifestyle interventions  06/03/2017 - counseled on statin, patient declines  12/02/2017- counseled on statin, patient declines  08/05/2018 - ASCVD 13%, started on Lipitor 40  09/29/2018 - stopped continue Lipitor (headaches, bloating)   01/03/2021 - ASCVD 15.8%, counseled on restarting Lipitor perhaps at lower dose, specifically discussed risk of ASCVD examples, pt declines, counseled on diet/exercise     Prediabetes    Chronic, relatively stable  04/14/2019- 5.8, counseled on exercise including continue biking  01/03/2021 - 6.0, counsled on healthy foods     Overweight    04/02/2016 - neg screen for OSA  01/03/2021 - stable, borderline obese, counseled on exercise     Mild Anemia (Resolved)    04/14/2019 - review of CBC in the last decade shows occasional mildly low hgb, and MCV is usually normal, likely not a pathology and normal variation for the patient, okay to continue the MVI with iron, will monitor every year or so.  01/03/2021 - reassuring labs last month           The above recommendation were discussed with the patient.  The patient has all questions answered satisfactorily and is in agreement with  this recommended plan of care.    Return if symptoms worsen or fail to improve.     Author:  Lendon Collar. Britanny Marksberry 01/03/2021 10:32 AM  Time of note filed does not necessarily reflect the time of encounter.  Portions of this note may have been created with voice recognition software. Occasional wrong-word or ''sound-alike'' substitutions may have occurred due to the inherent limitations of voice recognition software. Please read the chart carefully and recognize, using context, where these substitutions have occurred.

## 2021-01-05 DIAGNOSIS — S63279D Dislocation of unspecified interphalangeal joint of unspecified finger, subsequent encounter: Secondary | ICD-10-CM

## 2021-01-05 DIAGNOSIS — S62629D Displaced fracture of medial phalanx of unspecified finger, subsequent encounter for fracture with routine healing: Secondary | ICD-10-CM

## 2021-01-06 ENCOUNTER — Ambulatory Visit: Payer: BLUE CROSS/BLUE SHIELD

## 2021-01-06 NOTE — Patient Instructions
Thank you for entrusting your care to Dr. Elonda Husky and the [facility] Division of Sports Medicine.  As we continue your medical care, we would like to share a few helpful resources with regard to your ongoing care coordination and follow-up appointments:     Imaging (X-Ray, MRI, Ultrasound): You can schedule ay needed imaging directly with the [facility] Radiology Department by calling [phone].    Physical Therapy: If you have questions regarding your physical therapy prescription or need your physical therapy extended, please send a message via the MyChart secure messaging system so that we can renew or adjust your prescription accordingly. Please include the name of your physical therapy location.      Medication Refills: If you need a medication refill, please send a message via the MyChart secure messaging system so that we can refill your prescription appropriately.    Medication Authorization: If you have questions about insurance authorization for viscosupplementation (Monovisc, Durolane, SynviscOne, Orthovisc, Euflexxa, Supartz, etc.) or long-acting triamcinolone (Zilretta), please send a message via the MyChart secure messaging system and my assistant will send you an update as soon as possible.      Medical Questions: If you have additional questions about your injury, diagnosis, or treatment plan, you can either send a secure message via MyChart or schedule a video visit follow-up to discuss your questions in detail.    Follow-Up Visits: We try to prioritize telemedicine virtual follow-up visits when possible to minimize your time away from home & work as well as the hassle of [place] traffic and parking on campus.  To schedule a follow-up visit, e-mail my assistant, Sinnetti Piper, at [email or web address] who will schedule you for a video visit follow-up.  Note that some issues may specifically require an in-person follow-up visit (e.g. procedures, ultrasounds, etc.).    Clinical Availability: Dr. Elonda Husky works in the Division of Sports Medicine in our [place] clinic on Monday and in our telehealth clinic on Friday.  In addition to his clinical work in the Division of Sports Medicine, he spends time working in the [employer] for [facility], the [employer] [facility], and within [employer] on Tuesday, Wednesday, and Thursday.       New Injuries or Issues: New musculoskeletal injuries are unpredictable and will inevitable arise.  To discuss a new injury or issue, please e-mail my assistant at [email or web address] who will schedule you for the appropriate follow-up visit.      Sincerely,    Cherly Hensen, MD, MBA  Assistant Clinical Professor, [place] Division of Sports Medicine  Department of Orthopaedic Surgery & [facility] Medicine  Team Physician, [facility]    ------------------------------------------------------------------------------------

## 2021-01-09 ENCOUNTER — Ambulatory Visit: Payer: BLUE CROSS/BLUE SHIELD

## 2021-01-09 NOTE — Treatment Summary
OCCUPATIONAL THERAPY TREATMENT NOTE    PATIENT: Dustin Watts  GENDER: male  AGE: 65 y.o.  DOB: May 03, 1956  MRN: [id number]    Diagnosis:     ICD-10-CM    1. Closed dislocation of interphalangeal joint of finger of left hand, initial encounter  S63.279A      Medicare:    Visit number: 12 (next report due visit 20)    Plan of Care Expires: 03/13/2021  Authorization expires:  Medical necessity     Precautions: none    Pain: No    SUBJECTIVE: Patient saw the MD who recommended buddy taping, patient trialed this and it caused the middle finger to get sore, and per the MD there is still a little fracture    OBJECTIVE:    Xray report 2/28  IMPRESSION:  Redemonstration of a small avulsion fracture fragment at the volar base of the middle phalanx of the ring finger without significant change in alignment.  Mild soft tissue fullness about the PIP joint.  No significant arthritis.    - updated active range of motion measurements  - proximal interphalangeal joint volar plate massage followed by sustained extension stretching  - composite wrist and finger extension stretching  - place and hold in maximum finger extension  - size XXL digi sleeve provided as current one was falling off  - encouraged patient to bring in LMB and nighttime orthosis for possible adjustment      9 hole peg test 11/28/2020  23 seconds left    - indicates lack of extension, + indicates hyperextension  Ring finger active range of motion 01/09/21  Metacarpal phalangeal (MCP) joint extension/flexion Right 84   Proximal interphalangeal (PIP) joint extension/flexion Right -15/89 (-10 post treatment)   Distal interphalangeal (DIP) joint extension/flexion Right 37     GRIP (lbs) 2nd rung position 12/19/20  Right:    Left: 39     Access Code: [email or web address] URL: [email or web address]  ??? Seated Finger DIP Flexion AROM with Blocking - 2-3 x daily - 7 x weekly - 2 sets - 10 reps   ??? Finger PIP Flexion Extension with Blocking - 2-3 x daily - 7 x weekly - 2 sets - 10 reps   ??? Seated Finger Composite Flexion Extension - 2-3 x daily - 7 x weekly - 2 sets - 10 reps   ??? Seated Finger Composite Flexion Stretch - 2-3 x daily - 7 x weekly - 2 sets - 10 reps  -active hook fist while grasping highlighter  -grip strengthening with beige theraputty (1 minute x 3 sessions daily)    Patient Education:   Education Content:   see above, discomfort but not pain expected with exercises, nighttime orthosis , desensitization, digi-sleeve, bending every other finger, extension stretching   Education Delivery Method:   Verbal and demonstration   Education Response:   verbalizes understanding and demonstrates understanding      ASSESSMENT: Patient stiffer with proximal interphalangeal joint extension but responded well to manual techniques today regaining motion almost at last ''cold'' measurement from last session. Patient with gains for finger flexion, patient would benefit from continued use of orthoses and skilled occupational therapy for home program upgrades to improve functional skill performance.    PLAN: follow up on grip strengthening, continue to facilitate proximal interphalangeal extension and distal interphalangeal/ proximal interphalangeal flexsion, pinch and grip strengthening, continue with efforts to regain motion, adjust orthoses as needed    Patient was seen for a total of 40 minutes of treatment time.  40 minutes was in services represented by timed CPT codes.    Aevah Stansbery Joylene Igo, COTA/L

## 2021-01-14 ENCOUNTER — Ambulatory Visit: Payer: MEDICARE

## 2021-01-14 NOTE — Treatment Summary
OCCUPATIONAL THERAPY TREATMENT NOTE    PATIENT: Dustin Watts  GENDER: male  AGE: 65 y.o.  DOB: 11/11/1955  MRN: [id number]    Diagnosis:     ICD-10-CM    1. Closed dislocation of interphalangeal joint of finger of left hand, initial encounter  S63.279A      Medicare:    Visit number: 13 (next report due visit 20)    Plan of Care Expires: 03/13/2021  Authorization expires:  Medical necessity     Precautions: none    Pain: No    SUBJECTIVE: Patient reports waking up 3 nights ago with a burning sensation at tip of ring finger, he was wearing the orthosis    OBJECTIVE:  - no blisters or areas of redness or other irritation at tip of ring finger  - adjusted LMB split for more extension  - 20% pulsing ultrasound for 5 minutes at 3.3 MHz, 0.8 W/cm2 over volar ring finger proximal interphalangeal joint to heat tissues and promote healing  - soft tissue mobilization volar plate  - sustained extension stretching  - reformed nighttime orthosis      9 hole peg test 11/28/2020  23 seconds left    - indicates lack of extension, + indicates hyperextension  Ring finger active range of motion 01/14/21  Metacarpal phalangeal (MCP) joint extension/flexion Right 79   Proximal interphalangeal (PIP) joint extension/flexion Right -14/87   Distal interphalangeal (DIP) joint extension/flexion Right 40     GRIP (lbs) 2nd rung position 12/19/20  Right:    Left: 39     Access Code: [email or web address] URL: [email or web address]  ??? Seated Finger DIP Flexion AROM with Blocking - 2-3 x daily - 7 x weekly - 2 sets - 10 reps   ??? Finger PIP Flexion Extension with Blocking - 2-3 x daily - 7 x weekly - 2 sets - 10 reps   ??? Seated Finger Composite Flexion Extension - 2-3 x daily - 7 x weekly - 2 sets - 10 reps   ??? Seated Finger Composite Flexion Stretch - 2-3 x daily - 7 x weekly - 2 sets - 10 reps  -active hook fist while grasping highlighter  -grip strengthening with beige theraputty (1 minute x 3 sessions daily)    Patient Education:   Education Content:   see above, discomfort but not pain expected with exercises, nighttime orthosis , desensitization, digi-sleeve, bending every other finger, extension stretching   Education Delivery Method:   Verbal and demonstration   Education Response:   verbalizes understanding and demonstrates understanding      ASSESSMENT: No loss of range of motion, patient may benefit from positioning device for exercises focusing on interphalangeal extension.    PLAN: continue to facilitate proximal interphalangeal extension and distal interphalangeal/ proximal interphalangeal flexion, continue with efforts to regain motion, adjust orthoses as needed. Consider positioning device for interphalangeal extension    Patient was seen for a total of 35 minutes of treatment time. 35 minutes was in services represented by timed CPT codes.    Deanta Mincey Joylene Igo, COTA/L

## 2021-01-24 ENCOUNTER — Telehealth: Payer: BLUE CROSS/BLUE SHIELD

## 2021-01-24 NOTE — Telephone Encounter
Attempted to leave a message for patient but voicemail is full Dr. Elonda Husky will be out of the office on 02/17/21. Appointment has been cancelled and patient will need to call to reschedule. I will also call home number to attempt to leave a message.

## 2021-01-30 ENCOUNTER — Ambulatory Visit: Payer: MEDICAID

## 2021-01-30 NOTE — Treatment Summary
OCCUPATIONAL THERAPY TREATMENT NOTE    PATIENT: Dustin Watts  GENDER: male  AGE: 65 y.o.  DOB: Jul 01, 1956  MRN: [id number]    Diagnosis:     ICD-10-CM    1. Closed dislocation of interphalangeal joint of finger of left hand, initial encounter  S63.279A      Medicare:    Visit number: 14 (next report due visit 20)    Plan of Care Expires: 03/13/2021  Authorization expires:  Medical necessity     Precautions: none    Pain:Yes   Pain In: 2/10 (5/10 with closed fist)  Pain Out: 2/10  Location: left 4th digit   Duration: Intermittent  Description: sore      SUBJECTIVE:     OBJECTIVE:  -objective measurements updated below   - paraffin x 5 minutes to left hand   - 20% pulsing ultrasound for 5 minutes at 3.3 MHz, 0.8 W/cm2 over left 4th volar ring finger proximal interphalangeal joint to heat tissues and promote healing  - instrument assisted soft tissue mobilization and Chritopher soft tissue mobilization volar plate  - distraction and sustained extension stretching  -volar glide and flexion stretch of 4th proximal interphalangeal joint   -pincer grasp, in hand manipulation and translation using small pegs and grooved pegboard      9 hole peg test 11/28/2020  23 seconds left    - indicates lack of extension, + indicates hyperextension  Ring finger active range of motion 01/30/21  Metacarpal phalangeal (MCP) joint extension/flexion Right 81   Proximal interphalangeal (PIP) joint extension/flexion Right -12/84   Distal interphalangeal (DIP) joint extension/flexion Right 47     GRIP (lbs) 2nd rung position 12/19/20  Right:    Left: 39     Access Code: [email or web address] URL: [email or web address]  ??? Seated Finger DIP Flexion AROM with Blocking - 2-3 x daily - 7 x weekly - 2 sets - 10 reps   ??? Finger PIP Flexion Extension with Blocking - 2-3 x daily - 7 x weekly - 2 sets - 10 reps   ??? Seated Finger Composite Flexion Extension - 2-3 x daily - 7 x weekly - 2 sets - 10 reps   ??? Seated Finger Composite Flexion Stretch - 2-3 x daily - 7 x weekly - 2 sets - 10 reps  -active hook fist while grasping highlighter  -grip strengthening with beige theraputty (1 minute x 3 sessions daily)    Patient Education:   Education Content:   see above, discomfort but not pain expected with exercises, nighttime orthosis , desensitization, digi-sleeve, bending every other finger, extension stretching   Education Delivery Method:   Verbal and demonstration   Education Response:   verbalizes understanding and demonstrates understanding      ASSESSMENT: Improved left 3rd digit flexion and proximal interphalangeal extension this week. Some pain in 4th digit with closed fist, but overall improvement in activity tolerance.     PLAN: continue to facilitate proximal interphalangeal extension and distal interphalangeal/ proximal interphalangeal flexion, continue with efforts to regain motion, adjust orthoses as needed. Consider positioning device for interphalangeal extension, isometric grip strengthening     Patient was seen for a total of 35 minutes of treatment time. 35 minutes was in services represented by timed CPT codes.    Rivka Baune J Omaree Fuqua, OT/L

## 2021-02-04 ENCOUNTER — Ambulatory Visit: Payer: Medicaid HMO

## 2021-02-04 DIAGNOSIS — M170 Bilateral primary osteoarthritis of knee: Secondary | ICD-10-CM

## 2021-02-04 DIAGNOSIS — R52 Pain, unspecified: Secondary | ICD-10-CM

## 2021-02-04 MED ORDER — IBUPROFEN 600 MG PO TABS
600 mg | ORAL_TABLET | Freq: Every day | ORAL | 1 refills | Status: AC | PRN
Start: 2021-02-04 — End: ?

## 2021-02-04 NOTE — Patient Instructions
I recommend seeing Dr Jamelle Haring, the podiatrist at [facility], for evaluation of your left foot pain.

## 2021-02-04 NOTE — Progress Notes
PATIENT: Dustin Watts  MRN: [id number]  DOB: 1956-03-05  DATE OF SERVICE: 02/04/2021    CHIEF COMPLAINT:   Chief Complaint   Patient presents with   ??? Follow-up     skin lump of leg, right        History of Present Illness  Dustin Watts is a 65 y.o. male presents for:     R foot lesion  - chronic, months  - has been drained twice  - mildly painful  - returned to full swelling the same day after draining  - did not wrap  - no bleeding  - no other lesions except for thickening of the pantar aera on the distal L foot, thinks it is a recurrent wart  - Has chronic knee pain and L hand finger pain, wants refill of ibuprofen, takes this infrequently    ROS  No fever, no rash, no new weakness.      Past Medical History:   Diagnosis Date   ??? Acne    ??? Arthritis    ??? Cerebral palsy (HCC/RAF)    ??? Eczema    ??? Hypertension    ??? Impaired glucose tolerance 03/23/2016    HGBA1C of 5.7 on 03/19/2016    ??? Low vitamin D level 05/07/2016    05/07/2016 - taking antiepiletpic which may exacerbate it, started on ergo 50000, will recheck level in about 2 months    ??? Overweight    ??? Seizure (HCC/RAF) 1973    chronically on Dilantin, no seizure in 25 years, followed by Neurologist (Dr. Wiliam Ke) at [facility]   ??? Shortening of arm, congenital     right arm       Medications that the patient states to be currently taking   Medication Sig   ??? ASPIRIN LOW DOSE 81 MG EC tablet TAKE 1 TABLET BY MOUTH EVERY DAY   ??? benzoyl peroxide-erythromycin 5-3% gel Apply to acne once daily.Marland Kitchen   ??? Calcium Carb-Cholecalciferol (OYSTER SHELL CALCIUM W/D) 500-200 MG-UNIT TABS    ??? clindamycin 300 mg capsule TAKE 2 CAPSULES ORALLY EVERY 8 HOURS   ??? DILANTIN 100 MG ER capsule TAKE 4 CAPSULES ORALLY AT BEDTIME   ??? ibuprofen 600 mg tablet Take 1 tablet (600 mg total) by mouth daily as needed.   ??? RETIN-A MICRO PUMP 0.04 % gel Apply topically at bedtime.   ??? Tretinoin Microsphere (RETIN-A MICRO PUMP) 0.06 % GEL APPLY PEA SIZED AMOUNT TOPICALLY TO THE FACE NIGHTLY.   ??? [DISCONTINUED] IBUPROFEN 800 mg tablet TAKE 1 TABLET (800 MG TOTAL) BY MOUTH DAILY AS NEEDED.   ??? [DISCONTINUED] oxyCODONE 5 mg tablet Take 1 tablet (5 mg total) by mouth two (2) times daily as needed. Max Daily Amount: 10 mg       Physical Exam  Last Recorded Vital Signs:    02/04/21 0957   BP: 144/88   Pulse: 89   Resp: 20   Temp: 36.4 ???C (97.5 ???F)   SpO2: 97%     Wt Readings from Last 3 Encounters:   02/04/21 (!) 224 lb 12.8 oz (102 kg)   01/06/21 (!) 225 lb (102.1 kg)   01/03/21 (!) 225 lb 3.2 oz (102.2 kg)     Body mass index is 30.49 kg/m???.  System Check if normal Positive or additional negative findings   Constit  [x]  General appearance     Eyes  []  Conj/Lids []  Pupils  []  Fundi     HENMT  []   External ears/nose []  TMs (bilateral)   []  Gross Hearing []  Nasal mucosa   []  Lips/teeth/gums []  Oropharynx    []  Mucus membranes []  Head     Neck  []  Inspection/palpation []  Thyroid     Resp  [x]  Effort []  No wheezing    []  Auscultation  []  No crackles     CV  []  Rhythm/rate []  Murmurs []  LEE    []  JVP non-elevated []  Radial pulses     Breast  []  Inspection []  Palpation     GI  []  No abd masses  []  No tenderness   []  No rebound/guarding  []  Liver/spleen      GU  []  Scrotum []  Penis []  Prostate      Lymph  Normal: []  Neck []  Axillae []  Groin     MSK site examined:    []  Inspect/palp []  ROM []  Strength/tone        Skin  []  Inspection []  Palpation  Site: Very mild thickening of an area about 1cm on the distal central left plantar surface without specific lesion, no masses noted deep on palpation, no tenderness, no rash.  Has 4cm soft nontender edematous lesion on the middle dorsal surface of the R foot without unusual warmth or rash   Neuro  []  CN2-12 intact grossly   []  Alert and oriented   []  Muscle strength - legs     []  Sensation  []  Gait/balance     Psych  [x]  Insight/judgement  []  Mood/affect        I have:   [x]  Reviewed/ordered []  1 []  2 [x]  ? 3 unique laboratory, radiology, and/or diagnostic tests noted below       Lab Studies:  Lab Results   Component Value Date    NA 139 11/22/2020    K 4.5 11/22/2020    CL 102 11/22/2020    CO2 22 11/22/2020    BUN 9 11/22/2020    CREAT 0.46 (L) 11/22/2020    GLUCOSE 105 (H) 11/22/2020    CALCIUM 9.2 11/22/2020    ALT 18 11/22/2020    AST 22 11/22/2020    ALKPHOS 97 11/22/2020    BILITOT 0.2 11/22/2020     Lab Results   Component Value Date    WBC 5.45 11/22/2020    HGB 14.4 11/22/2020    HCT 43.6 11/22/2020    MCV 101.4 (H) 11/22/2020    PLT 211 11/22/2020     Lab Results   Component Value Date    HGBA1C 6.0 (H) 11/22/2020     Lab Results   Component Value Date    TSH 1.5 01/21/2017       Assessment and Plan       ICD-10-CM    1. Skin lump of leg, right  R22.41 Referral to Surgery   2. Plantar wart of left foot  B07.0 Referral to Podiatry     XR foot ap+lat+obl left (3 views)   3. Primary osteoarthritis of both knees  M17.0 ibuprofen 600 mg tablet   4. Body aches  R52 ibuprofen 600 mg tablet           Problem   Skin Lump of Leg, Right    08/23/2020 - 5cm soft tissue nodule/lump   11/22/2020 - US showed 5cm fluid collection, drained today with 18 gauge needle without complication producing mildly serosanguinous fluid  01/03/2021 - recurred, drained 10ml with 23 gauge needle without complication, serosanguinous fluid, f/u if recurs again, will consider surgical  eval if persistently recurring.  02/04/2021 - recurred, drained again without complication same as before, referral to surgery placed, advised wrapping consistently for 2 weeks and monitoring for recurrence.     Plantar Wart of Left Foot    Two on plantar aspect at base of 2nd and 3rd toes.  Multiple cryotherapy since Oct 2018  07/08/2018- mild, cryo again today  08/05/2018 - normal appearing plantar surface on exam today, counseled patient that cryotherapy is not needed today  09/08/2018 - resolved  01/06/2019 - recurred, cryotherapy again today, f/u prn 1 month  02/04/2021 - very mild, appears to be more of a callus than a plantar wart, may also has a lesion deep to the plantar surface that is not apparent on exam, will check xray and refer to podiatry for eval.  No significant lesion requiring cryotherapy or excision today           PROCEDURE NOTE: Drainage  Performed by: Lendon Collar. Reita Shindler  Indication: soft tissue edematous lesion of the upper R foot  Anesthesia was obtained with 1 ml of 2% lidocaine. The area was prepped in the usual sterile fashion. An 18 gauge needles was used to drain about 15 ml of serosanguinous fluid.  A dressing was placed over the site and wrapped with coban. The patient tolerated the procedure well.   Post-Procedure Diagnosis: same as indication  Complications: none  Estimated Blood Loss: less than 1 ml    The above recommendation were discussed with the patient.  The patient has all questions answered satisfactorily and is in agreement with this recommended plan of care.    Return in about 3 months (around 05/07/2021).     Author:  Lendon Collar. Reola Buckles 02/04/2021 10:41 AM  Time of note filed does not necessarily reflect the time of encounter.  Portions of this note may have been created with voice recognition software. Occasional wrong-word or ''sound-alike'' substitutions may have occurred due to the inherent limitations of voice recognition software. Please read the chart carefully and recognize, using context, where these substitutions have occurred.  [id number] - I spent 40-54 minutes (established patient). When time (rather than medical decision making) is used for determination of Level of Service for this encounter, the time includes any time spent reviewing the chart on the day of the visit, time spent face to face with the patient, time spent documenting the visit on the day of the visit, and may include counseling and coordination of care also performed on the day of the visit.

## 2021-02-04 NOTE — Addendum Note
Addended by: Murtis Sink on: 02/04/2021 10:42 AM     Modules accepted: Orders, Level of Service

## 2021-02-12 ENCOUNTER — Inpatient Hospital Stay: Payer: MEDICARE

## 2021-02-12 ENCOUNTER — Ambulatory Visit: Payer: MEDICAID

## 2021-02-12 DIAGNOSIS — M79671 Pain in right foot: Secondary | ICD-10-CM

## 2021-02-12 DIAGNOSIS — M7989 Other specified soft tissue disorders: Secondary | ICD-10-CM

## 2021-02-12 DIAGNOSIS — M79672 Pain in left foot: Secondary | ICD-10-CM

## 2021-02-12 DIAGNOSIS — G40909 Epilepsy, unspecified, not intractable, without status epilepticus: Secondary | ICD-10-CM

## 2021-02-12 NOTE — Progress Notes
PATIENT:Dustin Watts  JXB:[id number]  DOB:Mar 15, 1956  DATE OF SERVICE: 02/12/2021     Referring physician: Murtis Sink., MD, MPH    CHIEF COMPLAINT   Mass on the the right foot    HISTORY OF PRESENT ILLNESS   The patient is a very pleasant 65 y.o. male who complains of a mass to the dorsal  right foot which is not painful.  It bothers the patient in closed shoes and has been   present for few years.   No history of trauma to the foot.  Prior treatment for this mass include: prior needle aspiration right foot x2  Patient does admit to trauma a few years ago when he first noticed the mass    PAST MEDICAL HISTORY     Past Medical History:   Diagnosis Date   ??? Acne    ??? Arthritis    ??? Cerebral palsy (HCC/RAF)    ??? Eczema    ??? Hypertension    ??? Impaired glucose tolerance 03/23/2016    HGBA1C of 5.7 on 03/19/2016    ??? Low vitamin D level 05/07/2016    05/07/2016 - taking antiepiletpic which may exacerbate it, started on ergo 50000, will recheck level in about 2 months    ??? Overweight    ??? Seizure (HCC/RAF) 1973    chronically on Dilantin, no seizure in 25 years, followed by Neurologist (Dr. Wiliam Ke) at [facility]   ??? Shortening of arm, congenital     right arm       MEDICATIONS     Outpatient Medications Prior to Visit   Medication Sig   ??? ASPIRIN LOW DOSE 81 MG EC tablet TAKE 1 TABLET BY MOUTH EVERY DAY   ??? ATORVASTATIN 40 mg tablet TAKE 1 TABLET BY MOUTH EVERY DAY (Patient not taking: Reported on 01/03/2021)   ??? benzoyl peroxide-erythromycin 5-3% gel Apply to acne once daily.Marland Kitchen   ??? Calcium Carb-Cholecalciferol (OYSTER SHELL CALCIUM W/D) 500-200 MG-UNIT TABS    ??? Calcium Carbonate-Vitamin D (OYSTER SHELL CALCIUM + VITAMIN D) 500-200 mg-unit TABS tablet TAKE 1 TABLET BY MOUTH 3 TIMES A DAY (Patient not taking: Reported on 01/03/2021)   ??? clindamycin 300 mg capsule TAKE 2 CAPSULES ORALLY EVERY 8 HOURS   ??? DILANTIN 100 MG ER capsule TAKE 4 CAPSULES ORALLY AT BEDTIME   ??? ibuprofen 600 mg tablet Take 1 tablet (600 mg total) by mouth daily as needed.   ??? RETIN-A MICRO PUMP 0.04 % gel Apply topically at bedtime.   ??? sulfacetamide-sulfur 9-4.5 % LIQD liquid Apply to face twice daily. (Patient not taking: Reported on 01/03/2021.)   ??? Tretinoin Microsphere (RETIN-A MICRO PUMP) 0.06 % GEL APPLY PEA SIZED AMOUNT TOPICALLY TO THE FACE NIGHTLY.     No facility-administered medications prior to visit.       ALLERGIES     Allergies   Allergen Reactions   ??? Topiramate      Eye swelling   ??? Sulfa Antibiotics Angioedema     No anaphylaxis   ??? Amlodipine Other (See Comments)     Lip swelling without anaphylaxis   ??? Carvedilol Angioedema     Lip swelling without anaphylaxis   ??? Losartan Rash     No hive, no anaphylaxis, unclear if truly has rash from this medicine   ??? Penicillins Rash     ''bumps on body''       SURGICAL HISTORY     Past Surgical History:   Procedure Laterality Date   ???  rectal abscess  2010    surgery done at [facility]         SOCIAL HISTORY     Social History     Socioeconomic History   ??? Marital status: Married     Spouse name: Not on file   ??? Number of children: 0   ??? Years of education: Not on file   ??? Highest education level: Not on file   Occupational History   ??? 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 Smoking status: Never Smoker   ??? Smokeless tobacco: Never Used   Vaping Use   ??? Vaping Use: Never used   Substance and Sexual Activity   ??? Alcohol use: No   ??? Drug use: No   ??? Sexual activity: Not Currently     Partners: Female     Comment: never married (incorrectly documented in Epic)   Other Topics Concern   ??? Do you exercise at least a day, 3 or more days a week? No   ??? Types of Exercise? (List in Comments) Yes     Comment: biking infrequently, abdominal exercise   ??? Do you follow a special diet? No   ??? Vegan? No   ??? Vegetarian? No   ??? Pescatarian? No   ??? Lactose Free? No   ??? Gluten Free? No   ??? Omnivore? Yes   Social History Narrative   ??? Not on file     Social Determinants of Health     Financial Resource Strain: Not on file   Physical Activity: Not on file   Stress: Not on file       FAMILY HISTORY     Family History   Problem Relation Age of Onset   ??? Heart disease Mother    ??? Diabetes Mother    ??? Kidney disease Mother    ??? Stroke Brother    ??? Colon cancer Neg Hx    ??? Prostate cancer Neg Hx    ??? Ovarian cancer Neg Hx    ??? Breast cancer Neg Hx    ??? Hyperlipidemia Neg Hx    ??? Hypertension Neg Hx          ROS     Review of Systems    []  Upon full 12 point review of systems seen below, all findings were negative    Constitutional   []  Fever [] Chills [] Sweats [] Weight Change   Head, Eyes, Ears, Nose and Throat   [] Wear Contact Lenses [] Dentures [] Wearing Eyeglasses   [] Double Vision [] Cataract [] Dizziness   [] Difficulty Swallowing [] Neck Pain [] Sore Throat   [] Nosebleeds [] Problems with eyesight [] Ringing in the Ears   Cardiovascular   [] Chest Pain/Discomfort [] Cardiovascular Symptom [] Heart Murmur   [] Swelling lower extremity [] Leg pain with exercise [] Palpitations   Hematology/Lymphatic   [] Bleeding Problem [] Swollen Glands [] Lymphoma   [] Anemia [] Skin Lump - Location    Respiratory   [] Difficulty Breathing [] Wheezing [] Previous Pulmonary Disease   [] Exposure to TB [] Cough [] Pulmonary Symptoms   Gastrointestinal   [] Nausea [] Vomiting []  Diarrhea   [] Decrease in Appetite [] Abdominal Pain [] Constipation   Endocrine   [] Often Thirsty [] Frequent Urination [] Thyroid Disease   [] Urinary Symptoms [] Prostate Problems [] Prior Kidney Disease   Musculoskeletal   []  Musculoskeletal symptoms [] Feeling weak [] Joint Pain, Arthralgia   [] Weakness of limbs [] Prior Fracture    Nervous System   [] Ataxia [] Speech Difficulties [] Headache   [] Neuropathy [] Confusion/Disorientation [] Fainting   []   Convulsions    Skin   [] Rash [] Ulcer [] Lesions [] Sun Sensitivity   [] Color change [] Slow Healing [] Infections [] Cracking   [] Eczema (Pruritus) [] Growth [] Hair Loss   Allergic Immunologic History [] Dermatitis [] Rheumatoid Arthritis [] Lupus [] Collagen Vascular   Psychiatric   [] Nervousness [] Tension [] Depression        PHYSICAL EXAM     GEN: No acute distress.  Demonstrates appropriate mood and affect.    The patient looks stated age and appears well nourished.    VASC:  Dorsalis Pedis:  Palpable 2/4 bilateral feet  Posterior Tibial:  Palpable 2/4 bilateral feet  There is brisk capillary refill time to all digits less than 3 seconds.   There are no varicosities or telangiectasias to the ankles  Edema is absent bilaterally  Ecchymosis is not observed    DERM:   Skin tone and turgor are within normal limits.  There is no visible erythema.      NEURO:  Light touch and protective threshold sensation with monofilament wire testing is grossly intact to both feet. No motor deficits noted with resisted range of motion to the hindfoot or toes.    MSK:  No gross musculoskeletal deformities noted  There is a sessile soft tissue mass that is not tender to palpation over the right dorsal foot  There is no change in pigment to the overlying skin.  No surrounding erythema, edema or ecchymosis.    No pain with range of motion to any of the joints about the foot or ankle.  Noted hard small mass mobile no fluid noted sub 2nd metatarsal no tendernesstopalpation no lesions noted on the skin  Right foot inversion noted    ASSESSMENT/PLAN     Dustin Watts is a 65 y.o. male who presents with:    1. Soft tissue mass  [place] foot right    [place] foot arthrocentesis/aspiration/inj right    [place] foot left    XR foot ap+lat+obl bilat (3 views ea)   2. Pain in both feet  [place] foot right    [place] foot arthrocentesis/aspiration/inj right    [place] foot left    XR foot ap+lat+obl bilat (3 views ea)   3. Nonintractable epilepsy without status epilepticus, unspecified epilepsy type (HCC/RAF)      discussion of findings  B/l foot xrays and [place] ordered with possible needle aspiration with radiology  Likely right foot seroma vs hematoma 2/2 trauma  Discussed possible surgical removal of lesions  Will get imaging prior to any further intervention  Followup  After imaging    Sandre Kitty, DPM  02/12/2021

## 2021-02-13 ENCOUNTER — Ambulatory Visit: Payer: BLUE CROSS/BLUE SHIELD

## 2021-02-13 NOTE — Treatment Summary
OCCUPATIONAL THERAPY TREATMENT NOTE    PATIENT: Dustin Watts  GENDER: male  AGE: 65 y.o.  DOB: 10-Jun-1956  MRN: [id number]    Diagnosis:     ICD-10-CM    1. Closed dislocation of interphalangeal joint of finger of left hand, initial encounter  S63.279A      Medicare:    Visit number: 15 (next report due visit 20)    Plan of Care Expires: 03/13/2021  Authorization expires:  Medical necessity     Precautions: none    Pain:Yes   Pain In: 2/10   Pain Out: 2/10  Location: left 4th digit   Duration: Intermittent  Description: sore      SUBJECTIVE: ''I have mild aches at night time that comes in waves.''     OBJECTIVE:  -objective measurements updated below   -update nine hole peg test  - paraffin x 5 minutes to left hand with closed fist   -grip strengthening with 3 pound digiflex   - instrument assisted soft tissue mobilization and Sasha soft tissue mobilization volar plate  - distraction and sustained extension stretching  -volar glide and flexion stretch of 4th proximal interphalangeal joint (composite and individual joint)  -grip strengthening with 3 pound digiflex  -review of coban wrap to facilitate proximal interphalangeal and distal interphalangeal joint flexion       9 hole peg test 02/13/2021  20 seconds (left)    - indicates lack of extension, + indicates hyperextension  Ring finger active range of motion 02/13/21  Metacarpal phalangeal (MCP) joint extension/flexion Right 83 (90 post treatment)   Proximal interphalangeal (PIP) joint extension/flexion Right -11/87 (90 post treatment)   Distal interphalangeal (DIP) joint extension/flexion Right 50 (60 post treatment)     GRIP (lbs) 2nd rung position 12/19/20  Right: Not assessed    Left: 39     Access Code: [email or web address] URL: [email or web address]  ??? Seated Finger DIP Flexion AROM with Blocking - 2-3 x daily - 7 x weekly - 2 sets - 10 reps   ??? Finger PIP Flexion Extension with Blocking - 2-3 x daily - 7 x weekly - 2 sets - 10 reps   ??? Seated Finger Composite Flexion Extension - 2-3 x daily - 7 x weekly - 2 sets - 10 reps   ??? Seated Finger Composite Flexion Stretch - 2-3 x daily - 7 x weekly - 2 sets - 10 reps  -active hook fist while grasping highlighter  -grip strengthening with beige theraputty (1 minute x 3 sessions daily)    Patient Education:   Education Content:   see above, discomfort but not pain expected with exercises, nighttime orthosis , desensitization, digi-sleeve, bending every other finger, extension stretching   Education Delivery Method:   Verbal and demonstration   Education Response:   verbalizes understanding and demonstrates understanding      ASSESSMENT: Continued improvement in left 3rd digit flexion and proximal interphalangeal extension this week. Mild aching of digit, especially at night time. Patient responds very well to heat and stretches/joint mobilization. Achieved 90 degrees of proximal interphalangeal and distal interphalangeal flexion at the end of treatment session. Tolerated grip strengthening pain free.     PLAN: grip strengthening as tolerated, continue to facilitate proximal interphalangeal extension and distal interphalangeal/ proximal interphalangeal flexion, continue with efforts to regain motion    Patient was seen for a total of 35 minutes of treatment time. 30 minutes was in services represented by timed CPT codes.    Makenzy Krist J Kerby Hockley, OT/L

## 2021-02-17 ENCOUNTER — Ambulatory Visit: Payer: MEDICAID

## 2021-02-27 ENCOUNTER — Ambulatory Visit: Payer: BLUE CROSS/BLUE SHIELD

## 2021-02-27 NOTE — Treatment Summary
OCCUPATIONAL THERAPY TREATMENT NOTE    PATIENT: Dustin Watts  GENDER: male  AGE: 65 y.o.  DOB: [birth year]  MRN: [id number]    Diagnosis:     ICD-10-CM    1. Closed dislocation of interphalangeal joint of finger of left hand, initial encounter  S63.279A      Medicare:    Visit number: 16 (next report due visit 20)    Plan of Care Expires: 03/13/2021  Authorization expires:  Medical necessity     Precautions: none    Pain:Yes   Pain In: 2/10   Pain Out: 2/10  Location: left 4th digit   Duration: Intermittent  Description: sore      SUBJECTIVE: ''I''ve been using the putty for grip strengthening. I can tell I'm getting stronger.''     OBJECTIVE:     -objective measurements updated below   - paraffin x 5 minutes to left hand   -dorsal glide of left 4th digit proximal interphalangeal joint with extension stretch  - instrument assisted soft tissue mobilization and Elieser soft tissue mobilization volar plate  - distraction and sustained extension stretching of left 4th digit  -volar glide and flexion stretch of 4th proximal interphalangeal joint (composite and individual joint)  -strengthening of lumbricals pressing into yellow putty with paddle   -grip strengthening with yellow putty     9 hole peg test 02/13/2021  20 seconds (left)    - indicates lack of extension, + indicates hyperextension  Ring finger active range of motion 02/27/21  Metacarpal phalangeal (MCP) joint extension/flexion Right 76 (85 post treatment)   Proximal interphalangeal (PIP) joint extension/flexion Right -15/76 (92 post treatment)   Distal interphalangeal (DIP) joint extension/flexion Right 50 (60 post treatment)     GRIP (lbs) 2nd rung position 02/27/21  Right: Not assessed    Left: 44   85, 92, 60  Access Code: [email or web address] URL: [email or web address]  ??? Seated Finger DIP Flexion AROM with Blocking - 2-3 x daily - 7 x weekly - 2 sets - 10 reps   ??? Finger PIP Flexion Extension with Blocking - 2-3 x daily - 7 x weekly - 2 sets - 10 reps   ??? Seated Finger Composite Flexion Extension - 2-3 x daily - 7 x weekly - 2 sets - 10 reps   ??? Seated Finger Composite Flexion Stretch - 2-3 x daily - 7 x weekly - 2 sets - 10 reps  -active hook fist while grasping highlighter  -grip strengthening with beige theraputty (1 minute x 3 sessions daily)    Patient Education:   Education Content:   see above, discomfort but not pain expected with exercises, nighttime orthosis , desensitization, digi-sleeve, bending every other finger, extension stretching   Education Delivery Method:   Verbal and demonstration   Education Response:   verbalizes understanding and demonstrates understanding      ASSESSMENT: Some stiffness of left 4th digit but grip strength has increased. Mild aching of digit, especially at night time. Patient responds very well to heat and stretches/joint mobilization. Tolerated grip and lumbrical strengthening pain free. Patient is compliant with LMB orthosis wear.     PLAN: fine motor tasks, continue lumbrical strengthening, grip strengthening as tolerated, continue to facilitate proximal interphalangeal extension and distal interphalangeal/ proximal interphalangeal flexion, continue with efforts to regain motion    Patient was seen for a total of 35 minutes of treatment time. 30 minutes was in services represented by timed CPT codes.    Meilah Delrosario J Gisela Lea, OT/L

## 2021-03-05 ENCOUNTER — Ambulatory Visit: Payer: BLUE CROSS/BLUE SHIELD

## 2021-03-10 ENCOUNTER — Ambulatory Visit: Payer: BLUE CROSS/BLUE SHIELD

## 2021-03-10 DIAGNOSIS — S62629D Displaced fracture of medial phalanx of unspecified finger, subsequent encounter for fracture with routine healing: Secondary | ICD-10-CM

## 2021-03-10 DIAGNOSIS — S63279D Dislocation of unspecified interphalangeal joint of unspecified finger, subsequent encounter: Secondary | ICD-10-CM

## 2021-03-10 NOTE — Progress Notes
[facility] Department of Orthopaedic Surgery  Division of Sports Medicine    Sports Medicine Follow-Up Visit      Date: 03/10/2021  Primary Care Physician:  Murtis Sink., MD, MPH    Chief Complaint: Left hand 4th PIP dislocation + avulsion fracture, 6 months post-injury    Interval Events:  Dustin Watts is a 65 y.o. male presenting for follow-up of left hand 4th PIP dislocation + avulsion fracture. Since his last appointment, he has improved with some stiffness in the joint.  His pain at this time is mild and is described as stiffness with some pain with stretching, predominantly affecting the PIP joint of little finger The pain continues to be aggravated by stretching and improves with rest.  Overall, he feels the pain is gradually improving.    The patient has no additional complaints at this time and otherwise feels well.    Past Medical History:  Past Medical History:   Diagnosis Date   ??? Acne    ??? Arthritis    ??? Cerebral palsy (HCC/RAF)    ??? Eczema    ??? Hypertension    ??? Impaired glucose tolerance 03/23/2016    HGBA1C of 5.7 on 03/19/2016    ??? Low vitamin D level 05/07/2016    05/07/2016 - taking antiepiletpic which may exacerbate it, started on ergo 50000, will recheck level in about 2 months    ??? Overweight    ??? Seizure (HCC/RAF) 1973    chronically on Dilantin, no seizure in 25 years, followed by Neurologist (Dr. Wiliam Ke) at [facility]   ??? Shortening of arm, congenital     right arm       Past Surgical History:  Past Surgical History:   Procedure Laterality Date   ??? rectal abscess  2010    surgery done at [facility]       Medications:  Outpatient Medications Prior to Visit   Medication Sig Dispense Refill   ??? ASPIRIN LOW DOSE 81 MG EC tablet TAKE 1 TABLET BY MOUTH EVERY DAY 90 tablet 3   ??? benzoyl peroxide-erythromycin 5-3% gel Apply to acne once daily.. 46.6 g 11   ??? Calcium Carb-Cholecalciferol (OYSTER SHELL CALCIUM W/D) 500-200 MG-UNIT TABS      ??? clindamycin 300 mg capsule TAKE 2 CAPSULES ORALLY EVERY 8 HOURS     ??? DILANTIN 100 MG ER capsule TAKE 4 CAPSULES ORALLY AT BEDTIME     ??? ibuprofen 600 mg tablet Take 1 tablet (600 mg total) by mouth daily as needed. 30 tablet 1   ??? RETIN-A MICRO PUMP 0.04 % gel Apply topically at bedtime. 50 g 11   ??? Tretinoin Microsphere (RETIN-A MICRO PUMP) 0.06 % GEL APPLY PEA SIZED AMOUNT TOPICALLY TO THE FACE NIGHTLY. 50 g 0   ??? ATORVASTATIN 40 mg tablet TAKE 1 TABLET BY MOUTH EVERY DAY (Patient not taking: Reported on 01/03/2021) 90 tablet 3   ??? Calcium Carbonate-Vitamin D (OYSTER SHELL CALCIUM + VITAMIN D) 500-200 mg-unit TABS tablet TAKE 1 TABLET BY MOUTH 3 TIMES A DAY (Patient not taking: Reported on 01/03/2021)     ??? sulfacetamide-sulfur 9-4.5 % LIQD liquid Apply to face twice daily. (Patient not taking: Reported on 01/03/2021.) 454 g 11     No facility-administered medications prior to visit.       Allergies:   Allergies   Allergen Reactions   ??? Topiramate      Eye swelling   ??? Sulfa Antibiotics Angioedema  No anaphylaxis   ??? Amlodipine Other (See Comments)     Lip swelling without anaphylaxis   ??? Carvedilol Angioedema     Lip swelling without anaphylaxis   ??? Losartan Rash     No hive, no anaphylaxis, unclear if truly has rash from this medicine   ??? Penicillins Rash     ''bumps on body''       Review of Systems: A 14-point review of systems was performed.  With the exception of the HPI, all other review of systems was negative.     PHYSICAL EXAM:     Vitals: There were no vitals taken for this visit.  General: NAD, pleasant & cooperative  Head: EOMI, no facial lesions  Left Hand:    Inspection   Skin  intact    Swelling  moderate over the volar 4th PIP    Deformity  none     Palpation   Warmth  no warmth    Tenderness  moderate over the volar 4th PIP     ROM   5??? flexion, 2 CM from palm extension, tender over the volar plate      Strength   normal     Neurologic   normal sensation and reflexes     Vascular   normal, good capillary refill, good color and radial pulse present     Neuro: Normal sensation, no motor deficits  Skin: No ulcers, erythema, or skin breakdown  Psych: Alert, appropriate affect    IMAGING:     XR Left Finger (03/10/2021):  Ongoing healing of the middle phalanx proximal avulsion fracture.    XR Left Finger (01/06/2021):  Interval callus formation around the base of the middle phalanx.  ???  XR Left Finger (09/03/2020):  A small calcification by the proximal phalanx head has nearly resorbed.  A small fracture at the base of the middle phalanx is unchanged in alignment.  Minimal callus is seen.  The finger is normally aligned.    IMPRESSION:     Dustin Watts is a 65 y.o. male who presents for follow-up of left hand 4th PIP dislocation + avulsion fracture, 6 months post-injury. Overall, he feels his pain has resolved and his range of motion in the 4th PIP joint is gradually improving.    RECOMMENDATIONS & PLAN:     Closed dislocation of interphalangeal joint of left hand, sequela  Closed avulsion fracture of middle phalanx of finger with routine healing, subsequent encounter  -Continue hand therapy; provided extended script today  -Reviewed home exercise program  -Graded return to activity as tolerated     Follow-Up: Return if symptoms worsen or fail to improve.    The above recommendations were communicated to the patient who expressed understanding.    SCRIBE ATTESTATION:     I, Hewitt Shorts, was the scribe for patient Dustin Watts on 03/10/2021 at 10:28 AM for Sofie Rower, MD.    PHYSICIAN SIGNATURE:     Sofie Rower, MD, MBA  Division of Sports Medicine & Non-Operative Orthopedics  Department of Orthopaedic Surgery  [facility]

## 2021-03-11 ENCOUNTER — Ambulatory Visit: Payer: MEDICAID

## 2021-03-21 ENCOUNTER — Ambulatory Visit: Payer: MEDICARE

## 2021-03-21 ENCOUNTER — Ambulatory Visit: Payer: BLUE CROSS/BLUE SHIELD

## 2021-03-21 ENCOUNTER — Inpatient Hospital Stay: Payer: BLUE CROSS/BLUE SHIELD

## 2021-03-21 DIAGNOSIS — M7989 Other specified soft tissue disorders: Secondary | ICD-10-CM

## 2021-03-21 DIAGNOSIS — M79672 Pain in left foot: Secondary | ICD-10-CM

## 2021-03-21 DIAGNOSIS — M79671 Pain in right foot: Secondary | ICD-10-CM

## 2021-03-26 ENCOUNTER — Ambulatory Visit: Payer: BLUE CROSS/BLUE SHIELD

## 2021-03-26 DIAGNOSIS — M79672 Pain in left foot: Secondary | ICD-10-CM

## 2021-03-26 DIAGNOSIS — M7989 Other specified soft tissue disorders: Secondary | ICD-10-CM

## 2021-03-26 DIAGNOSIS — T792XXA Traumatic secondary and recurrent hemorrhage and seroma, initial encounter: Secondary | ICD-10-CM

## 2021-03-26 DIAGNOSIS — G40909 Epilepsy, unspecified, not intractable, without status epilepticus: Secondary | ICD-10-CM

## 2021-03-26 DIAGNOSIS — M79671 Pain in right foot: Secondary | ICD-10-CM

## 2021-03-26 NOTE — Progress Notes
PATIENT:Dustin Watts  ZOX:[id number]  DOB:1956/10/20  DATE OF SERVICE: 03/26/2021     Referring physician: Murtis Sink., MD, MPH    CHIEF COMPLAINT   Mass on the the right foot    HISTORY OF PRESENT ILLNESS   Interval history 03/26/21: followup  Right foot mass. Patient continues to have discomfort to the foot. Patient would like to review US findings.    The patient is a very pleasant 65 y.o. male who complains of a mass to the dorsal  right foot which is not painful.  It bothers the patient in closed shoes and has been   present for few years.   No history of trauma to the foot.  Prior treatment for this mass include: prior needle aspiration right foot x2  Patient does admit to trauma a few years ago when he first noticed the mass    PAST MEDICAL HISTORY     Past Medical History:   Diagnosis Date   ??? Acne    ??? Arthritis    ??? Cerebral palsy (HCC/RAF)    ??? Eczema    ??? Hypertension    ??? Impaired glucose tolerance 03/23/2016    HGBA1C of 5.7 on 03/19/2016    ??? Low vitamin D level 05/07/2016    05/07/2016 - taking antiepiletpic which may exacerbate it, started on ergo 50000, will recheck level in about 2 months    ??? Overweight    ??? Seizure (HCC/RAF) 1973    chronically on Dilantin, no seizure in 25 years, followed by Neurologist (Dr. Wiliam Ke) at [facility]   ??? Shortening of arm, congenital     right arm       MEDICATIONS     Outpatient Medications Prior to Visit   Medication Sig   ??? ASPIRIN LOW DOSE 81 MG EC tablet TAKE 1 TABLET BY MOUTH EVERY DAY   ??? benzoyl peroxide-erythromycin 5-3% gel Apply to acne once daily.Marland Kitchen   ??? Calcium Carb-Cholecalciferol (OYSTER SHELL CALCIUM W/D) 500-200 MG-UNIT TABS    ??? clindamycin 300 mg capsule TAKE 2 CAPSULES ORALLY EVERY 8 HOURS   ??? DILANTIN 100 MG ER capsule TAKE 4 CAPSULES ORALLY AT BEDTIME   ??? ibuprofen 600 mg tablet Take 1 tablet (600 mg total) by mouth daily as needed.   ??? RETIN-A MICRO PUMP 0.04 % gel Apply topically at bedtime.   ??? Tretinoin Microsphere (RETIN-A MICRO PUMP) 0.06 % GEL APPLY PEA SIZED AMOUNT TOPICALLY TO THE FACE NIGHTLY.     No facility-administered medications prior to visit.       ALLERGIES     Allergies   Allergen Reactions   ? Topiramate      Eye swelling   ? Sulfa Antibiotics Angioedema     No anaphylaxis   ? Amlodipine Other (See Comments)     Lip swelling without anaphylaxis   ? Carvedilol Angioedema     Lip swelling without anaphylaxis   ? Losartan Rash     No hive, no anaphylaxis, unclear if truly has rash from this medicine   ? Penicillins Rash     ''bumps on body''       SURGICAL HISTORY     Past Surgical History:   Procedure Laterality Date   ? rectal abscess  2010    surgery done at [facility] the [facility]         SOCIAL HISTORY     Social History     Socioeconomic History   ? Marital status:  Married   ? Number of children: 0   Occupational History   ? 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 Smoking status: Never Smoker   ? Smokeless tobacco: Never Used   Vaping Use   ? Vaping Use: Never used   Substance and Sexual Activity   ? Alcohol use: No   ? Drug use: No   ? Sexual activity: Not Currently     Partners: Female     Comment: never married (incorrectly documented in Epic)   Other Topics Concern   ? Do you exercise at least a day, 3 or more days a week? No   ? Types of Exercise? (List in Comments) Yes     Comment: biking infrequently, abdominal exercise   ? Do you follow a special diet? No   ? Vegan? No   ? Vegetarian? No   ? Pescatarian? No   ? Lactose Free? No   ? Gluten Free? No   ? Omnivore? Yes       FAMILY HISTORY     Family History   Problem Relation Age of Onset   ? Heart disease Mother    ? Diabetes Mother    ? Kidney disease Mother    ? Stroke Brother    ? Colon cancer Neg Hx    ? Prostate cancer Neg Hx    ? Ovarian cancer Neg Hx    ? Breast cancer Neg Hx    ? Hyperlipidemia Neg Hx    ? Hypertension Neg Hx          PHYSICAL EXAM     GEN: No acute distress.  Demonstrates appropriate mood and affect. The patient looks stated age and appears well nourished.    VASC:  Dorsalis Pedis:  Palpable 2/4 bilateral feet  Posterior Tibial:  Palpable 2/4 bilateral feet  There is brisk capillary refill time to all digits less than 3 seconds.   There are no varicosities or telangiectasias to the ankles  Edema is absent bilaterally  Ecchymosis is not observed    DERM:   Skin tone and turgor are within normal limits.  There is no visible erythema.      NEURO:  Light touch and protective threshold sensation with monofilament wire testing is grossly intact to both feet. No motor deficits noted with resisted range of motion to the hindfoot or toes.    MSK:  No gross musculoskeletal deformities noted  There is a sessile soft tissue mass that is not tender to palpation over the right dorsal foot  There is no change in pigment to the overlying skin.  No surrounding erythema, edema or ecchymosis.    No pain with range of motion to any of the joints about the foot or ankle.  Noted hard small mass mobile no fluid noted sub 2nd metatarsal no tendernesstopalpation no lesions noted on the skin  Right foot inversion noted      IMAGING        Right and left foot [place]    IMPRESSION:   ???  1.  No discrete soft tissue mass at the plantar aspect of the left foot.  ???  2.  Complex fluid collection within the dorsal tissues of the right foot, possibly representing a chronic seroma, hematoma or a posttraumatic adventitial bursa. In review of the electronic medical record, this has previously been aspirated.  ???  Signed by: [place]   ASSESSMENT/PLAN  Dustin Watts is a 65 y.o. male who presents with:    1. Soft tissue mass     2. Pain in both feet     3. Nonintractable epilepsy without status epilepticus, unspecified epilepsy type (HCC/RAF)     4. 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)      discussion of findings  Review of [place]   Left foot MRI ordered  Local block of 6cc 1:1 0.5% marcaine, 1% lidocaine plain given to dorsal right foot  Foot was prepped with DuraPrep  18 g needle was used to evacuate 12cc of serous fluid then compressive dressing was placed  Followup  PRN    Approximately 20 minutes was spent in reviewing the patients medical records and imaging studies, obtaining the patients medical history, performing the physical exam, discussing with the patient his condition, formulating a treatment plan, coordinating care, placing orders, and/or preparing the medical record note.  At least 50% of this time was spent in face to face interaction with the patient.    Sandre Kitty, DPM  03/26/2021

## 2021-04-03 ENCOUNTER — Telehealth: Payer: MEDICAID

## 2021-04-03 NOTE — Telephone Encounter
Straight to voicemail. Mailbox full.

## 2021-04-04 ENCOUNTER — Ambulatory Visit: Payer: BLUE CROSS/BLUE SHIELD

## 2021-04-04 ENCOUNTER — Ambulatory Visit: Payer: MEDICAID

## 2021-04-04 NOTE — Progress Notes
PATIENT: Dustin Watts  MRN: [id number]  DOB: [birth year]  DATE OF SERVICE: 04/04/2021    CHIEF COMPLAINT:   Chief Complaint   Patient presents with   ??? Results     XRAY   ??? Consult For     Covid booster        History of Present Illness  Dustin Watts is a 65 y.o. male presents for:     Covid vaccine  - has had 2  - needs booster  - questions about this booster    Foot pain  - b/l  - seen by Dr Jamelle Haring about 9 days ago  - had seroma on the R foot, seen on [place], drained by podiatry, has been wrapping, planning to repeat eval with podiatry next week  - xray of the L foot did not show abnormality  - podiatrist ordered MRI of L foot    ROS  No fever, no cough, no SOB    Past Medical History:   Diagnosis Date   ??? Acne    ??? Arthritis    ??? Cerebral palsy (HCC/RAF)    ??? Eczema    ??? Hypertension    ??? Impaired glucose tolerance 03/23/2016    HGBA1C of 5.7 on 03/19/2016    ??? Low vitamin D level 05/07/2016    05/07/2016 - taking antiepiletpic which may exacerbate it, started on ergo 50000, will recheck level in about 2 months    ??? Overweight    ??? Seizure (HCC/RAF) 1973    chronically on Dilantin, no seizure in 25 years, followed by Neurologist (Dr. Wiliam Ke) at [facility]   ??? Shortening of arm, congenital     right arm       Medications that the patient states to be currently taking   Medication Sig   ??? ASPIRIN LOW DOSE 81 MG EC tablet TAKE 1 TABLET BY MOUTH EVERY DAY   ??? benzoyl peroxide-erythromycin 5-3% gel Apply to acne once daily.Marland Kitchen   ??? Calcium Carb-Cholecalciferol (OYSTER SHELL CALCIUM W/D) 500-200 MG-UNIT TABS    ??? clindamycin 300 mg capsule TAKE 2 CAPSULES ORALLY EVERY 8 HOURS   ??? DILANTIN 100 MG ER capsule TAKE 4 CAPSULES ORALLY AT BEDTIME   ??? ibuprofen 600 mg tablet Take 1 tablet (600 mg total) by mouth daily as needed.   ??? RETIN-A MICRO PUMP 0.04 % gel Apply topically at bedtime.   ??? Tretinoin Microsphere (RETIN-A MICRO PUMP) 0.06 % GEL APPLY PEA SIZED AMOUNT TOPICALLY TO THE FACE NIGHTLY.       Physical Exam  Last Recorded Vital Signs:    04/04/21 1027   BP: 150/80   Pulse: (!) 100   Resp: 18   Temp: 36.7 ?C (98.1 ?F)   SpO2: 95%     Wt Readings from Last 3 Encounters:   04/04/21 (!) 231 lb 3.2 oz (104.9 kg)   02/04/21 (!) 224 lb 12.8 oz (102 kg)   01/06/21 (!) 225 lb (102.1 kg)     Body mass index is 31.36 kg/m?Marland Kitchen  System Check if normal Positive or additional negative findings   Constit  [x]  General appearance     Eyes  []  Conj/Lids []  Pupils  []  Fundi     HENMT  []  External ears/nose []  TMs (bilateral)   []  Gross Hearing []  Nasal mucosa   []  Lips/teeth/gums []  Oropharynx    []  Mucus membranes []  Head     Neck  []  Inspection/palpation []  Thyroid  Resp  [x]  Effort []  No wheezing    []  Auscultation  []  No crackles     CV  []  Rhythm/rate []  Murmurs []  LEE    []  JVP non-elevated []  Radial pulses     Breast  []  Inspection []  Palpation     GI  []  No abd masses  []  No tenderness   []  No rebound/guarding  []  Liver/spleen      GU  []  Scrotum []  Penis []  Prostate      Lymph  Normal: []  Neck []  Axillae []  Groin     MSK site examined:    []  Inspect/palp []  ROM []  Strength/tone        Skin  []  Inspection []  Palpation  Site:  about 5cm soft mass on the dorsal surface of the R foot, nontender   Neuro  []  CN2-12 intact grossly   []  Alert and oriented   []  Muscle strength - legs     []  Sensation  []  Gait/balance     Psych  []  Insight/judgement  []  Mood/affect        I have:   [x]  Reviewed/ordered []  1 []  2 [x]  ? 3 unique laboratory, radiology, and/or diagnostic tests noted below       Lab Studies:  Lab Results   Component Value Date    NA 139 11/22/2020    K 4.5 11/22/2020    CL 102 11/22/2020    CO2 22 11/22/2020    BUN 9 11/22/2020    CREAT 0.46 (L) 11/22/2020    GLUCOSE 105 (H) 11/22/2020    CALCIUM 9.2 11/22/2020    ALT 18 11/22/2020    AST 22 11/22/2020    ALKPHOS 97 11/22/2020    BILITOT 0.2 11/22/2020     Lab Results   Component Value Date    WBC 5.45 11/22/2020    HGB 14.4 11/22/2020    HCT 43.6 11/22/2020    MCV 101.4 (H) 11/22/2020    PLT 211 11/22/2020     Lab Results   Component Value Date    HGBA1C 6.0 (H) 11/22/2020     Lab Results   Component Value Date    TSH 1.5 01/21/2017         Imaging Studies:   XR foot ap+lat+obl bilat (3 views ea)    Result Date: 02/13/2021  IMPRESSION: Nonspecific soft tissue tissue mass dorsally on the right foot. Healed fractures of the right fourth and fifth metatarsals. Mild hallux MTP osteoarthritis. No significant abnormalities on the left. Signed by: Addison Lank   02/13/2021 9:38 AM    [place] foot left    Result Date: 03/21/2021  IMPRESSION: 1.  No discrete soft tissue mass at the plantar aspect of the left foot. 2.  Complex fluid collection within the dorsal tissues of the right foot, possibly representing a chronic seroma, hematoma or a posttraumatic adventitial bursa. In review of the electronic medical record, this has previously been aspirated. Signed by: [place] S Gross   03/21/2021 2:02 PM    [place] foot right    Result Date: 03/21/2021  IMPRESSION: 1.  No discrete soft tissue mass at the plantar aspect of the left foot. 2.  Complex fluid collection within the dorsal tissues of the right foot, possibly representing a chronic seroma, hematoma or a posttraumatic adventitial bursa. In review of the electronic medical record, this has previously been aspirated. Signed by: [place] S Gross   03/21/2021 2:02 PM    XR finger pa+lat+obl left (3  views)    Result Date: 03/10/2021  IMPRESSION: Previously described the volar base avulsion fracture off of the fourth middle phalanx appears to be healing. There are no alignment changes when compared to XR 01/06/2021. Signed by: [place] S Gross   03/10/2021 2:13 PM    XR finger pa+lat+obl left (3 views)    Result Date: 01/06/2021  IMPRESSION: Redemonstration of a small avulsion fracture fragment at the volar base of the middle phalanx of the ring finger without significant change in alignment. Mild soft tissue fullness about the PIP joint. No significant arthritis. Signed by: Pincus Badder   01/06/2021 12:59 PM    XR finger pa+lat+obl left (3 views)    Result Date: 09/30/2020  IMPRESSION: A small calcification by the proximal phalanx head has nearly resorbed. A small fracture at the base of the middle phalanx is unchanged in alignment. Minimal callus is seen. The finger is normally aligned. Signed by: Addison Lank   09/30/2020 1:26 PM    XR finger pa+lat+obl left (3 views)    Result Date: 09/03/2020  IMPRESSION: A small nondisplaced cortical fracture is noted about the base of the middle phalanx consistent with a volar plate fracture. This is well-corticated and likely subacute to chronic. There is mild soft tissue swelling. The alignment is within normal limits. Signed by: Evangeline Gula   09/03/2020 10:08 AM    [place] lower extremity lump or bump non-vascular right    Result Date: 11/21/2020  IMPRESSION: Complex hypoechoic fluid collection measuring 5.4 cm corresponding to the area of concern in the anterior right foot, may relate to an evolving hematoma or other complex collection; superinfection not excluded. Correlate with visual inspection and for prior history of trauma. If clinically indicated, aspiration could be considered. Signed by: Ledon Snare   11/21/2020 10:16 AM      Assessment and Plan       ICD-10-CM    1. Skin lump of leg, right  R22.41    2. Plantar wart of left foot  B07.0            Problem   Skin Lump of Leg, Right    Seroma  08/23/2020 - 5cm soft tissue nodule/lump,    11/22/2020 - US showed 5cm fluid collection, drained today with 18 gauge needle without complication producing mildly serosanguinous fluid  01/03/2021 - recurred, drained 10ml with 23 gauge needle without complication, serosanguinous fluid, f/u if recurs again, will consider surgical eval if persistently recurring.  02/04/2021 - recurred, drained again without complication same as before, referral to surgery placed, advised wrapping consistently for 2 weeks and monitoring for recurrence.  04/04/2021 - recent repeat draining with PCP and podiatry, has been wrapping it but recurred again, drained today without complication, will f/u with podiatry again for discussion of surgical removal       Plantar Wart of Left Foot    Two on plantar aspect at base of 2nd and 3rd toes.  Multiple cryotherapy since Oct 2018  07/08/2018- mild, cryo again today  08/05/2018 - normal appearing plantar surface on exam today, counseled patient that cryotherapy is not needed today  09/08/2018 - resolved  01/06/2019 - recurred, cryotherapy again today, f/u prn 1 month  02/04/2021 - very mild, appears to be more of a callus than a plantar wart, may also has a lesion deep to the plantar surface that is not apparent on exam, will check xray and refer to podiatry for eval.  No significant lesion requiring cryotherapy or excision today  04/04/2021 - patient describes symptoms in that area but no lesion noted, will complete MRI         PROCEDURE NOTE: seroma drainage  Performed by: Lendon Collar. Ralf Konopka  Indication: seroma  Area cleaned thoroughly with alcohol swab.  23 gauge needle used to drain seroma on the upper R foot.  Return was 10 ml of clear serous fluid. A dressing was placed over the site and wrapped. The patient tolerated the procedure well. Wound care instructions were given.   Post-Procedure Diagnosis: same as indication  Complications: none  Estimated Blood Loss: trace    The above recommendation were discussed with the patient.  The patient has all questions answered satisfactorily and is in agreement with this recommended plan of care.    Return if symptoms worsen or fail to improve.     Author:  Lendon Collar. Kylynn Street 04/04/2021 11:03 AM  Time of note filed does not necessarily reflect the time of encounter.  Portions of this note may have been created with voice recognition software. Occasional wrong-word or ''sound-alike'' substitutions may have occurred due to the inherent limitations of voice recognition software. Please read the chart carefully and recognize, using context, where these substitutions have occurred.  16109 - I spent 30-39 minutes (established patient). When time (rather than medical decision making) is used for determination of Level of Service for this encounter, the time includes any time spent reviewing the chart on the day of the visit, time spent face to face with the patient, time spent documenting the visit on the day of the visit, and may include counseling and coordination of care also performed on the day of the visit.

## 2021-04-04 NOTE — Patient Instructions
I recommend getting the covid booster at your local pharmacy

## 2021-04-18 ENCOUNTER — Ambulatory Visit: Payer: BLUE CROSS/BLUE SHIELD

## 2021-04-18 NOTE — Treatment Summary
OCCUPATIONAL THERAPY TREATMENT NOTE    PATIENT: Dustin Watts  GENDER: male  AGE: 65 y.o.  DOB: 10-21-1956  MRN: [id number]    Diagnosis:     ICD-10-CM    1. Closed dislocation of interphalangeal joint of finger of left hand, initial encounter  S63.279A      Medicare:    Visit number: 17 (next report due visit 20)    Plan of Care Expires: 07/11/2021  Authorization expires:  Medical necessity     Precautions: none    Pain: No (only at night time)      SUBJECTIVE: Patient experiencing ongoing night pain/tingling in finger. Left 3rd digit A1 pulley area is tender.    OBJECTIVE:     -objective measurements updated below   - paraffin x 5 minutes to left hand   -cross friction massage of left A1 pulley area of left 3rd/2nd digits  -dorsal glide of left 4th digit proximal interphalangeal joint with extension stretch  - instrument assisted soft tissue mobilization and Elizjah soft tissue mobilization volar plate  - distraction and sustained extension stretching of left 4th digit  -volar glide and flexion stretch of 4th proximal interphalangeal joint (composite and individual joint)    - indicates lack of extension, + indicates hyperextension  Ring finger active range of motion 04/18/21  Metacarpal phalangeal (MCP) joint extension/flexion Right 71    Proximal interphalangeal (PIP) joint extension/flexion Right -17/87    Distal interphalangeal (DIP) joint extension/flexion Right 56      GRIP (lbs) 2nd rung position 02/27/21  Right: Not assessed    Left: 44   85, 92, 60  Access Code: [email or web address] URL: [email or web address]  ? Seated Finger DIP Flexion AROM with Blocking - 2-3 x daily - 7 x weekly - 2 sets - 10 reps   ? Finger PIP Flexion Extension with Blocking - 2-3 x daily - 7 x weekly - 2 sets - 10 reps   ? Seated Finger Composite Flexion Extension - 2-3 x daily - 7 x weekly - 2 sets - 10 reps   ? Seated Finger Composite Flexion Stretch - 2-3 x daily - 7 x weekly - 2 sets - 10 reps  -active hook fist while grasping highlighter  -grip strengthening with beige theraputty (1 minute x 3 sessions daily)    Patient Education:   Education Content:   see above, discomfort but not pain expected with exercises, nighttime orthosis , desensitization, digi-sleeve, bending every other finger, extension stretching   Education Delivery Method:   Verbal and demonstration   Education Response:   verbalizes understanding and demonstrates understanding      ASSESSMENT: Ongoing night pain at 4th finger. Patient reports new tenderness at left A1 pulley of 3rd digit. Concern for possible early onset trigger finger. Patient encouraged to perform cross friction massage of 3rd digit at home and to start warm water soaks. Patient responds very well to heat and stretches/joint mobilization. Tolerated grip and lumbrical strengthening pain free.    PLAN: follow up on 3rd digit A1 pulley pain, fine motor tasks, continue lumbrical strengthening, grip strengthening as tolerated, continue to facilitate proximal interphalangeal extension and distal interphalangeal/ proximal interphalangeal flexion, continue with efforts to regain motion    Patient was seen for a total of 40 minutes of treatment time. 35 minutes was in services represented by timed CPT codes.    Ferol Laiche J Marshella Tello, OT/L    Plan of Care:    Problem List:  Decreased range of motion, Decreased strength,  Increased edema and Increased pain  Decreased tolerance for keyboard / computer use, Limitations in ability to handle groceries, Limitations in ability to perform driving related tasks, Limitations in ability to perform fine motor activities associated with dressing, Limitations in ability to perform household tasks , Limitations in handling utensils, Limitations in meal preparation, Limitations in opening jars / bottles   ?  Goals:  Short Term Goal:  Patient will learn and implement at least three techniques to decrease finger swelling for ease in grasping objects in 2 sessions. Met   Patient will increase left 4th metacarpophalangeal joint active flexion by at least 30 degrees for ease in performing dressing tasks in 6 weeks. Met  Patient will sleep throughout night with no more than 1-2/10 pain in 6 weeks.?Continue (currently at 5/10)  ?  Long Term Goal:  Patient will?demonstrate at least 20-25 pounds of?grip strength for ease in opening jars/bottles in 12?weeks. Met   Patient will perform all self care with modified independence - independence in 12 weeks. Met  Patient will sleep sans finger pain in 12 weeks.?Continue     New goal:  Patient will report 50% less parasthesia in involved digit for ease in performing meal preparation in 8 weeks.?    ASSESSMENT:  65 year old?male?presents with referral diagnosis of Closed dislocation of interphalangeal joint of finger of left hand, initial encounter  Avulsion fracture of middle phalanx of finger, closed, initial encounter?since 08/15/20.  ?  Patient able to perform self care without limitation, although he continues to have moderate pain in 4th digit at night time. Patient making good progress in proximal interphalangeal joint flexion and extension. Some stiffness of distal interphalangeal joint flexion. New tenderness of 3rd A1 pulley area. Functional limitations of right upper extremity due to cerebral palsy as well as left digit injury restrict patient's ability to perform some daily tasks including gripping/grasping objects. Strong gains made in left grip strength and patient tolerates putty squeezes well.   ?  Symptoms are in the?chronic?phase with mild?irritability.   ?  The patient's primary impairment(s) of?left?finger?include decreased range of motion, decreased grip and pinch strength, increased pain, increased inflammation, decreased dexterity and decreased coordination?which is limiting the ability to perform sleep, perform meal preparation, complete work activities, complete fitness/wellness routine, perform household tasks and participate in hobbies.?Refer to problem list.   ?  The patient requires skilled Occupational Therapy that can be safely and effective performed only by a qualified therapist to address the above deficits.  ?  Ashleigh Luckow J Charelle Petrakis, OT  ?  ?  ?  ?  ?  ?  REHAB POTENTIAL:?Good  ?  OBSTACLES TO REHABILITATION/CO-MORBIDITIES THAT MAY AFFECT GOALS AND TREATMENT PLAN:?Functional limitations of right upper extremity due to cerebral palsy  ?  Plan:   INTERVENTIONS:?  Therapeutic Exercise, Manual Therapy, Neuromuscular Re-education, Therapeutic Activities, Ultrasound, Traction, Electrical Stimulation, Iontophoresis, Paraffin, Heat Therapy, Cold Therapy, Patient/Caregiver Education, Self Care Training and Light Therapy, [id number], [id number], [phone]  ?  Patient to be seen?1-2?times per week for 12?week(s).  ?  Patient agrees with goals and treatment plan as outlined above:Yes

## 2021-04-21 ENCOUNTER — Ambulatory Visit: Payer: MEDICAID

## 2021-04-21 DIAGNOSIS — M79671 Pain in right foot: Secondary | ICD-10-CM

## 2021-04-21 DIAGNOSIS — T792XXA Traumatic secondary and recurrent hemorrhage and seroma, initial encounter: Secondary | ICD-10-CM

## 2021-04-21 DIAGNOSIS — M7989 Other specified soft tissue disorders: Secondary | ICD-10-CM

## 2021-04-21 DIAGNOSIS — M79672 Pain in left foot: Secondary | ICD-10-CM

## 2021-04-21 NOTE — Progress Notes
PATIENT:Dustin Watts  ZOX:[id number]  DOB:04-Aug-1956  DATE OF SERVICE: 04/21/2021     Referring physician: Murtis Sink., MD, MPH    CHIEF COMPLAINT   Mass on the the right foot    HISTORY OF PRESENT ILLNESS   Interval history 04/21/21: followup right foot mass s/p needle aspiration on 03/26/21. Seroma has started to gradually come back and cause some discomfort.    03/26/21: followup  Right foot mass. Patient continues to have discomfort to the foot. Patient would like to review US findings.    The patient is a very pleasant 65 y.o. male who complains of a mass to the dorsal  right foot which is not painful.  It bothers the patient in closed shoes and has been   present for few years.   No history of trauma to the foot.  Prior treatment for this mass include: prior needle aspiration right foot x2  Patient does admit to trauma a few years ago when he first noticed the mass    PAST MEDICAL HISTORY     Past Medical History:   Diagnosis Date   ? Acne    ? Arthritis    ? Cerebral palsy (HCC/RAF)    ? Eczema    ? Hypertension    ? Impaired glucose tolerance 03/23/2016    HGBA1C of 5.7 on 03/19/2016    ? Low vitamin D level 05/07/2016    05/07/2016 - taking antiepiletpic which may exacerbate it, started on ergo 50000, will recheck level in about 2 months    ? Overweight    ? Seizure (HCC/RAF) 1973    chronically on Dilantin, no seizure in 25 years, followed by Neurologist (Dr. Wiliam Ke) at [facility]   ? Shortening of arm, congenital     right arm       MEDICATIONS     Outpatient Medications Prior to Visit   Medication Sig   ? ASPIRIN LOW DOSE 81 MG EC tablet TAKE 1 TABLET BY MOUTH EVERY DAY   ? benzoyl peroxide-erythromycin 5-3% gel Apply to acne once daily..   ? Calcium Carb-Cholecalciferol (OYSTER SHELL CALCIUM W/D) 500-200 MG-UNIT TABS    ? clindamycin 300 mg capsule TAKE 2 CAPSULES ORALLY EVERY 8 HOURS   ? DILANTIN 100 MG ER capsule TAKE 4 CAPSULES ORALLY AT BEDTIME   ? ibuprofen 600 mg tablet Take 1 tablet (600 mg total) by mouth daily as needed.   ? RETIN-A MICRO PUMP 0.04 % gel Apply topically at bedtime.   ? Tretinoin Microsphere (RETIN-A MICRO PUMP) 0.06 % GEL APPLY PEA SIZED AMOUNT TOPICALLY TO THE FACE NIGHTLY.     No facility-administered medications prior to visit.       ALLERGIES     Allergies   Allergen Reactions   ? Topiramate      Eye swelling   ? Sulfa Antibiotics Angioedema     No anaphylaxis   ? Amlodipine Other (See Comments)     Lip swelling without anaphylaxis   ? Carvedilol Angioedema     Lip swelling without anaphylaxis   ? Losartan Rash     No hive, no anaphylaxis, unclear if truly has rash from this medicine   ? Penicillins Rash     ''bumps on body''       SURGICAL HISTORY     Past Surgical History:   Procedure Laterality Date   ? rectal abscess  2010    surgery done at [facility]  SOCIAL HISTORY     Social History     Socioeconomic History   ? Marital status: Married   ? Number of children: 0   Occupational History   ? 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 Smoking status: Never Smoker   ? Smokeless tobacco: Never Used   Vaping Use   ? Vaping Use: Never used   Substance and Sexual Activity   ? Alcohol use: No   ? Drug use: No   ? Sexual activity: Not Currently     Partners: Female     Comment: never married (incorrectly documented in Epic)   Other Topics Concern   ? Do you exercise at least a day, 3 or more days a week? No   ? Types of Exercise? (List in Comments) Yes     Comment: biking infrequently, abdominal exercise   ? Do you follow a special diet? No   ? Vegan? No   ? Vegetarian? No   ? Pescatarian? No   ? Lactose Free? No   ? Gluten Free? No   ? Omnivore? Yes       FAMILY HISTORY     Family History   Problem Relation Age of Onset   ? Heart disease Mother    ? Diabetes Mother    ? Kidney disease Mother    ? Stroke Brother    ? Colon cancer Neg Hx    ? Prostate cancer Neg Hx    ? Ovarian cancer Neg Hx    ? Breast cancer Neg Hx    ? Hyperlipidemia Neg Hx    ? Hypertension Neg Hx          PHYSICAL EXAM     GEN: No acute distress.  Demonstrates appropriate mood and affect.    The patient looks stated age and appears well nourished.    VASC:  Dorsalis Pedis:  Palpable 2/4 bilateral feet  Posterior Tibial:  Palpable 2/4 bilateral feet  There is brisk capillary refill time to all digits less than 3 seconds.   There are no varicosities or telangiectasias to the ankles  Edema is absent bilaterally  Ecchymosis is not observed    DERM:   Skin tone and turgor are within normal limits.  There is no visible erythema.      NEURO:  Light touch and protective threshold sensation with monofilament wire testing is grossly intact to both feet. No motor deficits noted with resisted range of motion to the hindfoot or toes.    MSK:  No change in MSK exam other than smaller mass from previous visit  No gross musculoskeletal deformities noted  There is a sessile soft tissue mass that is not tender to palpation over the right dorsal foot  There is no change in pigment to the overlying skin.  No surrounding erythema, edema or ecchymosis.    No pain with range of motion to any of the joints about the foot or ankle.  Noted hard small mass mobile no fluid noted sub 2nd metatarsal no tendernesstopalpation no lesions noted on the skin  Right foot inversion noted      IMAGING        Right and left foot [place]    IMPRESSION:   ?  1.  No discrete soft tissue mass at the plantar aspect of the left foot.  ?  2.  Complex fluid collection within the dorsal tissues of the  right foot, possibly representing a chronic seroma, hematoma or a posttraumatic adventitial bursa. In review of the electronic medical record, this has previously been aspirated.  ?  Signed by: [place]   ASSESSMENT/PLAN     HUSSEIN MACDOUGAL is a 65 y.o. male who presents with:    1. Soft tissue mass     2. Pain in both feet     3. 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)      discussion of findings  Review of [place]   Continue with scheduled left foot MRI  Local block of 3cc 1:1 0.5% marcaine, 1% lidocaine plain given to dorsal right foot  Foot was prepped with DuraPrep  18 g needle was used to evacuate 6cc of serous fluid then compressive dressing was placed  Left LE compression dressing applied to be taken off by patient in 7 days to help prevent recurrence of mass  Discussed briefly possibility of surgical excision of mass  Followup  PRN    Approximately 20 minutes was spent in reviewing the patients medical records and imaging studies, obtaining the patients medical history, performing the physical exam, discussing with the patient his condition, formulating a treatment plan, coordinating care, placing orders, and/or preparing the medical record note.  At least 50% of this time was spent in face to face interaction with the patient.    Sandre Kitty, DPM  04/21/2021

## 2021-04-25 ENCOUNTER — Ambulatory Visit: Payer: BLUE CROSS/BLUE SHIELD

## 2021-04-25 ENCOUNTER — Telehealth: Payer: MEDICAID

## 2021-04-25 ENCOUNTER — Telehealth: Payer: BLUE CROSS/BLUE SHIELD

## 2021-04-25 NOTE — Telephone Encounter
Message to Practice/Provider      Message: Patient can't make appt today with Dr Dub Mikes, Dr Jamelle Haring drained his foot and he is having trouble walking. He also wants to wish Dr Dub Mikes Happy Birthday.    Return call is not being requested by the patient or caller.    Patient or caller has been notified of the 24-48 hour processing turnaround time if applicable.

## 2021-04-25 NOTE — Telephone Encounter
Message to Practice/Provider      Message: Patient can't make it Dr Jamelle Haring drained foot will call Happy Birthday    Return call is not being requested by the patient or caller.    Patient or caller has been notified of the 24-48 hour processing turnaround time if applicable.

## 2021-04-25 NOTE — Telephone Encounter
Reply by: Laroy Apple    Acknowledged    PCP for this pt is Dr. Vic Blackbird,

## 2021-04-25 NOTE — Telephone Encounter
CLOSED. ERROR

## 2021-04-30 ENCOUNTER — Telehealth: Payer: MEDICAID

## 2021-05-02 ENCOUNTER — Ambulatory Visit: Payer: BLUE CROSS/BLUE SHIELD

## 2021-05-05 ENCOUNTER — Ambulatory Visit: Payer: BLUE CROSS/BLUE SHIELD

## 2021-05-08 ENCOUNTER — Non-Acute Institutional Stay: Payer: BLUE CROSS/BLUE SHIELD

## 2021-05-09 ENCOUNTER — Ambulatory Visit: Payer: BLUE CROSS/BLUE SHIELD

## 2021-05-09 NOTE — Progress Notes
PATIENT: Dustin Watts  MRN: [id number]  DOB: 12/10/1955  DATE OF SERVICE: 05/09/2021    CHIEF COMPLAINT:   Chief Complaint   Patient presents with   ? Follow-up     Following up on right foot        History of Present Illness  [name] is a 65 y.o. male presents for:     Fluid collection  - R foot  - chronic, years ago had trauma  - diagnosed as seroma  - recently seen by podiatry, drained, wrapped, then ''cast'' applied  - 18 g needle was used to evacuate 6cc of serous fluid then compressive dressing was placed  Left LE compression dressing applied to be taken off by patient in 7 days to help prevent recurrence of mass      ROS  No rash, no weakness, no fever, no bleeding    Past Medical History:   Diagnosis Date   ? Acne    ? Arthritis    ? Cerebral palsy (HCC/RAF)    ? Eczema    ? Hypertension    ? Impaired glucose tolerance 03/23/2016    HGBA1C of 5.7 on 03/19/2016    ? Low vitamin D level 05/07/2016    05/07/2016 - taking antiepiletpic which may exacerbate it, started on ergo 50000, will recheck level in about 2 months    ? Overweight    ? Seizure (HCC/RAF) 1973    chronically on Dilantin, no seizure in 25 years, followed by Neurologist (Dr. Wiliam Ke) at [facility]   ? Shortening of arm, congenital     right arm       Medications that the patient states to be currently taking   Medication Sig   ? ASPIRIN LOW DOSE 81 MG EC tablet TAKE 1 TABLET BY MOUTH EVERY DAY   ? benzoyl peroxide-erythromycin 5-3% gel Apply to acne once daily..   ? Calcium Carb-Cholecalciferol (OYSTER SHELL CALCIUM W/D) 500-200 MG-UNIT TABS    ? DILANTIN 100 MG ER capsule TAKE 4 CAPSULES ORALLY AT BEDTIME       Physical Exam  Last Recorded Vital Signs:    05/09/21 0910   BP: 164/92   Pulse: 81   Resp: 16   Temp: 36.3 ?C (97.4 ?F)   SpO2: 94%     Wt Readings from Last 3 Encounters:   05/09/21 (!) 226 lb 6.4 oz (102.7 kg)   04/04/21 (!) 231 lb 3.2 oz (104.9 kg)   02/04/21 (!) 224 lb 12.8 oz (102 kg)     Body mass index is 30.71 kg/m?Marland Kitchen  System Check if normal Positive or additional negative findings   Constit  [x]  General appearance     Eyes  []  Conj/Lids []  Pupils  []  Fundi     HENMT  []  External ears/nose []  TMs (bilateral)   []  Gross Hearing []  Nasal mucosa   []  Lips/teeth/gums []  Oropharynx    []  Mucus membranes []  Head     Neck  []  Inspection/palpation []  Thyroid     Resp  [x]  Effort []  No wheezing    []  Auscultation  []  No crackles     CV  []  Rhythm/rate []  Murmurs []  LEE    []  JVP non-elevated []  Radial pulses     Breast  []  Inspection []  Palpation     GI  []  No abd masses  []  No tenderness   []  No rebound/guarding  []  Liver/spleen      GU  []   Scrotum []  Penis []  Prostate      Lymph  Normal: []  Neck []  Axillae []  Groin     MSK site examined:    []  Inspect/palp []  ROM []  Strength/tone        Skin  []  Inspection []  Palpation  Site: Soft about 4cm nontender fluctuant mass on the upper R foot without rash   Neuro  []  CN2-12 intact grossly   []  Alert and oriented   []  Muscle strength - legs     []  Sensation  []  Gait/balance     Psych  []  Insight/judgement  []  Mood/affect        I have:   [x]  Reviewed/ordered []  1 []  2 [x]  ? 3 unique laboratory, radiology, and/or diagnostic tests noted below       Lab Studies:  Lab Results   Component Value Date    NA 139 11/22/2020    K 4.5 11/22/2020    CL 102 11/22/2020    CO2 22 11/22/2020    BUN 9 11/22/2020    CREAT 0.46 (L) 11/22/2020    GLUCOSE 105 (H) 11/22/2020    CALCIUM 9.2 11/22/2020    ALT 18 11/22/2020    AST 22 11/22/2020    ALKPHOS 97 11/22/2020    BILITOT 0.2 11/22/2020     Lab Results   Component Value Date    WBC 5.45 11/22/2020    HGB 14.4 11/22/2020    HCT 43.6 11/22/2020    MCV 101.4 (H) 11/22/2020    PLT 211 11/22/2020     Lab Results   Component Value Date    HGBA1C 6.0 (H) 11/22/2020     Lab Results   Component Value Date    TSH 1.5 01/21/2017       Assessment and Plan       ICD-10-CM    1. 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 Plantar wart of left foot  B07.0 Problem   Seroma Due to Trauma (Hcc/Raf)    Seroma  08/23/2020 - 5cm soft tissue nodule/lump,    11/22/2020 - US showed 5cm fluid collection, drained today with 18 gauge needle without complication producing mildly serosanguinous fluid  01/03/2021 - recurred, drained 10ml with 23 gauge needle without complication, serosanguinous fluid  02/04/2021 - recurred, drained again without complication same as before, referral to surgery placed, advised wrapping consistently for 2 weeks  04/04/2021 - recent repeat draining with PCP and podiatry, has been wrapping it but recurred again, drained today without complication, will f/u with podiatry again for discussion of surgical removal  05/09/2021 - again repeat drainage, wrapped, counseled on f/u with podiatry for discussion of definitive therapy       Plantar Wart of Left Foot    Two on plantar aspect at base of 2nd and 3rd toes.  Multiple cryotherapy since Oct 2018  07/08/2018- mild, cryo again today  08/05/2018 - normal appearing plantar surface on exam today, counseled patient that cryotherapy is not needed today  09/08/2018 - resolved  01/06/2019 - recurred, cryotherapy again today, f/u prn 1 month  02/04/2021 - very mild, appears to be more of a callus than a plantar wart, may also has a lesion deep to the plantar surface that is not apparent on exam, will check xray and refer to podiatry for eval.  No significant lesion requiring cryotherapy or excision today  04/04/2021 - patient describes symptoms in that area but no lesion noted, will complete MRI  05/09/2021 -  recently seen by podiatry, awaiting MRI for further eval         PROCEDURE NOTE: Seroma drainage  Performed by: Lendon Collar. Malay Fantroy  Indication: recurrent seroma  Area cleaned thoroughly with alcohol swab.  25 gauge needle used to administer 1ml of Xilocaine 2% for pain control, then 18 gauge needle used to to drain seroma on the upper R foot.  Return was 8 ml of clear serous fluid. A dressing was placed over the site and wrapped. The patient tolerated the procedure well. Wound care instructions were given.   Post-Procedure Diagnosis: same as indication  Complications: none  Estimated Blood Loss: trace    The above recommendation were discussed with the patient.  The patient has all questions answered satisfactorily and is in agreement with this recommended plan of care.    Return if symptoms worsen or fail to improve.     Author:  Lendon Collar. Seylah Wernert 05/09/2021 9:29 AM  Time of note filed does not necessarily reflect the time of encounter.  Portions of this note may have been created with voice recognition software. Occasional wrong-word or ''sound-alike'' substitutions may have occurred due to the inherent limitations of voice recognition software. Please read the chart carefully and recognize, using context, where these substitutions have occurred.  [id number] - I spent 30-39 minutes (established patient). When time (rather than medical decision making) is used for determination of Level of Service for this encounter, the time includes any time spent reviewing the chart on the day of the visit, time spent face to face with the patient, time spent documenting the visit on the day of the visit, and may include counseling and coordination of care also performed on the day of the visit.

## 2021-05-15 ENCOUNTER — Non-Acute Institutional Stay: Payer: BLUE CROSS/BLUE SHIELD

## 2021-05-16 ENCOUNTER — Ambulatory Visit: Payer: BLUE CROSS/BLUE SHIELD

## 2021-05-16 NOTE — Treatment Summary
OCCUPATIONAL THERAPY TREATMENT NOTE    PATIENT: Dustin Watts  GENDER: male  AGE: 65 y.o.  DOB: 01/02/1956  MRN: [id number]    Diagnosis:     ICD-10-CM    1. Closed dislocation of interphalangeal joint of finger of left hand, initial encounter  S63.279A      Medicare:    Visit number: 18 (next report due visit 20)    Plan of Care Expires: 07/11/2021  Authorization expires:  Medical necessity     Precautions: none    Pain: No (only at interphalangeal joint with lumbrical strengthening, and when testing grip strength)      SUBJECTIVE: ''Once in a while I get pain in my ring finger on my left hand. This happened two nights in a row. I feel like the finger is moving more with the exercises. The strength in my ring finger has decreased, I have been dropping objects recently.''        OBJECTIVE:     -updated range of motion and grip strength measurements   -paraffin x 5 minutes to left hand   -proximal interphalangeal extension stretch of left 4th digit   -distal and proximal interphalangeal flexion stretch of left 4th digit   -lumbrical strengthening with paddle and pink/ yellow theraputty  (pain 3/10 with pink and yellow theraputty at interphalangeal joint joint)  -fine motor task (pegs in/out with left first digit and 4th digit using 9 hole peg board)   -issued finger tube (plastic) to help gain extension of left fourth digit to be worn for 30 minutes at a time to enhance extension of proximal interphalangeal joint      Ring finger active range of motion 05/16/21  Metacarpal phalangeal (MCP) joint extension/flexion Right 64   Proximal interphalangeal (PIP) joint extension/flexion Right -9 (volar)/80   Distal interphalangeal (DIP) joint extension/flexion Right 50     GRIP (lbs) 2nd rung position 05/16/21  Right: Not assessed    Left: 45.5, 42.5 (with pain)     Access Code: [email or web address] URL: [email or web address]  ? Seated Finger DIP Flexion AROM with Blocking - 2-3 x daily - 7 x weekly - 2 sets - 10 reps   ? Finger PIP Flexion Extension with Blocking - 2-3 x daily - 7 x weekly - 2 sets - 10 reps   ? Seated Finger Composite Flexion Extension - 2-3 x daily - 7 x weekly - 2 sets - 10 reps   ? Seated Finger Composite Flexion Stretch - 2-3 x daily - 7 x weekly - 2 sets - 10 reps  -active hook fist while grasping highlighter  -grip strengthening with beige theraputty (1 minute x 3 sessions daily)    Patient Education:   Education Content:   see above, discomfort but not pain expected with exercises, nighttime orthosis , desensitization, digi-sleeve, bending every other finger, extension stretching   Education Delivery Method:   Verbal and demonstration   Education Response:   verbalizes understanding and demonstrates understanding      ASSESSMENT: Updated range of motion and grip strength. Slight stiffness in flexion. Patient reported pain with grip strength at left 4th digit (3/10). This was discontinued. Per patient, he has been dropping objects at home due to pain/discomfort in his left 4th digit with grasping for 5-10 minutes. Therefore, he tries not engage this digit when picking up items. Patient educated and encouraged to engage this digit to avoid weakness and stiffness. Patient agreed to avoiding grasping objects for long periods of time. He does  report that tingling in his left 4th digit fingertip occurs twice a week. Followed up on left 3rd digit pain in A1 pulley area during last visit- no pain reported this session.      PLAN: follow up on finger tube, strengthening excerises for 4th digit, fine motor tasks, continue lumbrical strengthening, grip strengthening as tolerated, continue to facilitate proximal interphalangeal extension and distal interphalangeal/ proximal interphalangeal flexion, continue with efforts to regain motion    Patient was seen for a total of 30 minutes of treatment time. 25 minutes was in services represented by timed CPT codes.    Jessy Cybulski J Myrta Mercer, OT/L

## 2021-05-23 ENCOUNTER — Ambulatory Visit: Payer: BLUE CROSS/BLUE SHIELD

## 2021-05-23 NOTE — Treatment Summary
OCCUPATIONAL THERAPY TREATMENT NOTE    PATIENT: Dustin Watts  GENDER: male  AGE: 65 y.o.  DOB: 23-Dec-1955  MRN: [id number]    Diagnosis:     ICD-10-CM    1. Closed dislocation of interphalangeal joint of finger of left hand, initial encounter  S63.279A      Medicare:    Visit number: 19 (next report due visit 20)    Plan of Care Expires: 07/11/2021  Authorization expires:  Medical necessity     Precautions: none    Pain: No (tingling sensation in left 4th digit)      SUBJECTIVE: ''I have been taking asprin everyday for the pain and it has been helping. I am having a tingling sensation in my right ring finger. The finger tube that you made last visit is a little tight. I still feel like I am dropping objects, but I am trying to place them on the table when I feel them beginning to fall out of my hand.''        OBJECTIVE:       -paraffin x 5 minutes to left hand   -left hand lumbrical stretch   -distal and proximal interphalangeal flexion stretch with distraction of left 4th digit  -distal and proximal interphalangeal extension stretch with distraction of left 4th digit     -lumbrical strengthening with paddle and pink/ yellow theraputty  (pain 3/10 with pink and yellow theraputty at interphalangeal joint joint)  -isometric left grip strengthening with tennis ball   -fine motor task (pegs in/out with left first digit and 4th digit using grey peg board)   -issued XL digisleeve for left 4th digit       Ring finger active range of motion 05/16/21  Metacarpal phalangeal (MCP) joint extension/flexion Right 64   Proximal interphalangeal (PIP) joint extension/flexion Right -9 (volar)/80   Distal interphalangeal (DIP) joint extension/flexion Right 50     GRIP (lbs) 2nd rung position 05/16/21  Right: Not assessed    Left: 45.5, 42.5 (with pain)     Access Code: [email or web address] URL: [email or web address]  ? Seated Finger DIP Flexion AROM with Blocking - 2-3 x daily - 7 x weekly - 2 sets - 10 reps   ? Finger PIP Flexion Extension with Blocking - 2-3 x daily - 7 x weekly - 2 sets - 10 reps   ? Seated Finger Composite Flexion Extension - 2-3 x daily - 7 x weekly - 2 sets - 10 reps   ? Seated Finger Composite Flexion Stretch - 2-3 x daily - 7 x weekly - 2 sets - 10 reps  -active hook fist while grasping highlighter  -grip strengthening with beige theraputty (1 minute x 3 sessions daily)    Patient Education:   Education Content:   see above, discomfort but not pain expected with exercises, nighttime orthosis , desensitization, digi-sleeve, bending every other finger, extension stretching   Education Delivery Method:   Verbal and demonstration   Education Response:   verbalizes understanding and demonstrates understanding      ASSESSMENT: Overall, patient is dropping less objects as he is now more aware of when he feels the object begin to fall out of his hand. Treatment tolerated well, however, he did have pain using the pink theraputty for lumbrical strengthening with paddle (yellow putty OK). Initial pain with isometric gripping- patient educated to avoid extreme gripping of tennis ball. Finger tube issued last visit is helpful per patient, but is too small. Will modify finger tube to make  bigger next session.       PLAN:  strengthening excerises for 4th digit, fine motor tasks, continue lumbrical strengthening, grip strengthening as tolerated, continue to facilitate proximal interphalangeal extension and distal interphalangeal/ proximal interphalangeal flexion, continue with efforts to regain motion    Patient was seen for a total of 30 minutes of treatment time. 25 minutes was in services represented by timed CPT codes.    [place] J [place], OT/L

## 2021-05-30 ENCOUNTER — Ambulatory Visit: Payer: BLUE CROSS/BLUE SHIELD

## 2021-05-30 ENCOUNTER — Non-Acute Institutional Stay: Payer: BLUE CROSS/BLUE SHIELD

## 2021-05-30 NOTE — Progress Notes
PATIENT: Dustin Watts  MRN: [id number]  DOB: June 17, 1956  DATE OF SERVICE: 05/30/2021    CHIEF COMPLAINT:   Chief Complaint   Patient presents with   ? Follow-up     Right foot        History of Present Illness  Dustin Watts is a 65 y.o. male presents for:     Seroma  - R foot  - improved after drainage about 3 weeks ago  - in the last few days has return about half the size  - has not been consistently bandaging it but was consistent for 10 days until removing  - has MRi to eval this next month  - mild discomfort in that area    ROS  No weakness, no numbness, no bleeding, no fever    Past Medical History:   Diagnosis Date   ? Acne    ? Arthritis    ? Cerebral palsy (HCC/RAF)    ? Eczema    ? Hypertension    ? Impaired glucose tolerance 03/23/2016    HGBA1C of 5.7 on 03/19/2016    ? Low vitamin D level 05/07/2016    05/07/2016 - taking antiepiletpic which may exacerbate it, started on ergo 50000, will recheck level in about 2 months    ? Overweight    ? Seizure (HCC/RAF) 1973    chronically on Dilantin, no seizure in 25 years, followed by Neurologist (Dr. Wiliam Ke) at [facility]   ? Shortening of arm, congenital     right arm       Medications that the patient states to be currently taking   Medication Sig   ? ASPIRIN LOW DOSE 81 MG EC tablet TAKE 1 TABLET BY MOUTH EVERY DAY   ? benzoyl peroxide-erythromycin 5-3% gel Apply to acne once daily..   ? Calcium Carb-Cholecalciferol (OYSTER SHELL CALCIUM W/D) 500-200 MG-UNIT TABS    ? clindamycin 300 mg capsule    ? DILANTIN 100 MG ER capsule TAKE 4 CAPSULES ORALLY AT BEDTIME   ? RETIN-A MICRO PUMP 0.04 % gel Apply topically at bedtime.       Physical Exam  Vitals:    05/30/21 1134 05/30/21 1136   BP: 162/90 151/89   BP Location: Left arm Left arm   Patient Position: Sitting Sitting   Cuff Size: Large Large   Pulse: 99 92   Resp: 18    Temp: 36.2 ?C (97.2 ?F)    SpO2: 95%    Weight: (!) 227 lb (103 kg)    Height: 6' (1.829 m)      Wt Readings from Last 3 Encounters:   05/30/21 (!) 227 lb (103 kg)   05/09/21 (!) 226 lb 6.4 oz (102.7 kg)   04/04/21 (!) 231 lb 3.2 oz (104.9 kg)     Body mass index is 30.79 kg/m?Marland Kitchen  System Check if normal Positive or additional negative findings   Constit  [x]  General appearance     Eyes  []  Conj/Lids []  Pupils  []  Fundi     HENMT  []  External ears/nose []  TMs (bilateral)   []  Gross Hearing []  Nasal mucosa   []  Lips/teeth/gums []  Oropharynx    []  Mucus membranes []  Head     Neck  []  Inspection/palpation []  Thyroid     Resp  [x]  Effort []  No wheezing    []  Auscultation  []  No crackles     CV  []  Rhythm/rate []  Murmurs []  LEE    []   JVP non-elevated []  Radial pulses     Breast  []  Inspection []  Palpation     GI  []  No abd masses  []  No tenderness   []  No rebound/guarding  []  Liver/spleen      GU  []  Scrotum []  Penis []  Prostate      Lymph  Normal: []  Neck []  Axillae []  Groin     MSK site examined:    []  Inspect/palp []  ROM []  Strength/tone     Soft about 3cm nontender fluctuant mass on the upper R foot without rash and without tenderness   Skin  []  Inspection []  Palpation  Site:     Neuro  []  CN2-12 intact grossly   []  Alert and oriented   []  Muscle strength - legs     []  Sensation  []  Gait/balance     Psych  []  Insight/judgement  []  Mood/affect        I have:   [x]  Reviewed/ordered []  1 []  2 [x]  ? 3 unique laboratory, radiology, and/or diagnostic tests noted below       Lab Studies:  Lab Results   Component Value Date    NA 139 11/22/2020    K 4.5 11/22/2020    CL 102 11/22/2020    CO2 22 11/22/2020    BUN 9 11/22/2020    CREAT 0.46 (L) 11/22/2020    GLUCOSE 105 (H) 11/22/2020    CALCIUM 9.2 11/22/2020    ALT 18 11/22/2020    AST 22 11/22/2020    ALKPHOS 97 11/22/2020    BILITOT 0.2 11/22/2020     Lab Results   Component Value Date    WBC 5.45 11/22/2020    HGB 14.4 11/22/2020    HCT 43.6 11/22/2020    MCV 101.4 (H) 11/22/2020    PLT 211 11/22/2020     Lab Results   Component Value Date    HGBA1C 6.0 (H) 11/22/2020     Lab Results Component Value Date    TSH 1.5 01/21/2017       Assessment and Plan       ICD-10-CM    1. 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)  T79.2XXA            Problem   Seroma Due to Trauma (Hcc/Raf)    Seroma  08/23/2020 - 5cm soft tissue nodule/lump,    11/22/2020 - US showed 5cm fluid collection, drained today with 18 gauge needle without complication producing mildly serosanguinous fluid  01/03/2021 - recurred, drained 10ml with 23 gauge needle without complication, serosanguinous fluid  02/04/2021 - recurred, drained again without complication same as before, referral to surgery placed, advised wrapping consistently for 2 weeks  04/04/2021 - recent repeat draining with PCP and podiatry, has been wrapping it but recurred again, drained today without complication, will f/u with podiatry again for discussion of surgical removal  05/09/2021 - again repeat drainage, wrapped, counseled on f/u with podiatry for discussion of definitive therapy  05/30/2021 - repeat drainage today, will directly contact Podiatry for expert opinion if there is definitive therapy. Keep wrapped for 2 weeks minimum         PROCEDURE NOTE:?Seroma drainage  Performed by:?Lendon Collar Baley Shands  Indication:?recurrent seroma  Area cleaned thoroughly with alcohol swab. ?25 gauge needle used to administer 1ml of Xilocaine 2% for pain control, then 18 gauge needle used to to drain seroma on the upper R foot. ?Return was?4.5?ml of clear serous?fluid. A dressing was placed over the site?and wrapped. The patient  tolerated the procedure well. Wound care instructions were given.   Post-Procedure Diagnosis: same as indication  Complications: none  Estimated Blood Loss:?trace    The above recommendation were discussed with the patient.  The patient has all questions answered satisfactorily and is in agreement with this recommended plan of care.    Return in about 4 weeks (around 06/27/2021).     Author:  Lendon Collar. Assata Juncaj 05/30/2021 12:25 PM  Time of note filed does not necessarily reflect the time of encounter.  Portions of this note may have been created with voice recognition software. Occasional wrong-word or ''sound-alike'' substitutions may have occurred due to the inherent limitations of voice recognition software. Please read the chart carefully and recognize, using context, where these substitutions have occurred.  16109 - I spent 30-39 minutes (established patient). When time (rather than medical decision making) is used for determination of Level of Service for this encounter, the time includes any time spent reviewing the chart on the day of the visit, time spent face to face with the patient, time spent documenting the visit on the day of the visit, and may include counseling and coordination of care also performed on the day of the visit.

## 2021-06-02 NOTE — Progress Notes
RHEUMATOLOGY OUTPATIENT CONSULTATION  PATIENT: Dustin Watts MRN: [id number] DOB: 20-May-1956  REASONS FOR VISIT/CHIEF COMPLAINT:OA      HPI:65 y.o. male former pt of Dr Roselee Nova with generalized OA, OK knees here to establish care.  Hx of epilepsy, pre-diabetic, HTN, HLD, vit D deficiency      Current visit patient reports the followings:-      10/2019-doing well ibuprofen may be 3 times a week left knee bothers walking not bad ''ibuprofen before doing a lot of walking'' no stomach issue aware not to take daily  - lost about 3 lbs doing squat daily 200s and bike      07/2019  - staying home due to covid  - L knee is not bad; occasional small pain with walking ''do I have fluid in my knee''  - Ibuprofen 800 mg; tried not to take too many; 4-5 times a month or so could be more e.g before going out/walking    03/2019  - L knee doing OK; last injection pain after the procedure lasted 1.5 month; now fine; walking fine  - L ankle strain earlier this year not recall splint  - Ibuprofen 800 mg prn BID but not need them; taking oxycodone as needed on average once at night for the chronic LBP    09/2018  - overall good no falls; L knee some swelling and worsening pain wondering if take fluid out or if any fluid inside  - ibuprofen as needed 800 mg 2 times a day only prn for years no problem with food no indigestion  - otherwise doing well      11/2017   -last visit increasing knee pain XR R knee good L knee mild OA   - doing well; no seizure any longer  - joints are not bad; no pain with walking; no need to take ibuprofen regularly; takes infrequently- a day before and on day out e[place]g going to [place]; requesting refill; no problem no GERD/taking with food    08/2017  -last visit 02/2017 joints are doing fine on prn NSAID/post CVA  -knees are doing not too badly ''only after long walk'' ''described as blown up'', keen to have refill ibuprofen 800 mg PRN given by Dr Roselee Nova in past ''I only take it if I have a long walk to do so taking infrequently''; no stiffness; at times puffy not today; slight worse lately with pain   -other joints are fine  -chronic R sided atrophy since borne Dx cerebral palsy otherwise in good health  -no seizure for some time now      No known history of malar rash, photosensitivity rash, discoid lesions, oral/nasal ulcers,  pleuritic chest pain, seizures/psychosis, Raynaud's, skin tightness, dry eyes/mouth, hair loss or blood clots.  No history of muscle pain or proximal myopathy.  No history for fever, weight loss, loss of appetite, night sweat or lymphadenopathy.  No history of skin psoriasis, eye issues, STDs or IBD.    No recent travelling.  No sick contacts.    Prior medication/Joint injections/Immune suppressant therapy:  None    ROS:  A 14 point ROS was obtained and significant for what is mentioned in HPI and below.    ROS    (Check if Normal, or note + findings):   []    reviewed questionnaire on 06/01/2021    []   Not Obtainable :   [x]    Constit : [-] fever, [-] weight loss    [x]    Skin :[-] vasculitic  palpable rash, [-] tender nodules, [-]scaling plaques      [x]    Eyes : [-] photophobia, [-] red eye [-] gritty eyes    [x]    CV :[-] LE swelling, orthopnea, [-] PND,    []    MS :     [x]    ENT: [-] ear /nose swelling, [-] throat pain or post nasal d/c   [x]    GI :[-] constipation [-] diarrhea  [-] melena [-]heartburn,   [x]    Heme/Lymph :[-] easy bruising or bleeding      [x]   Resp : [-] DOE, [-] wheezing [-] pleurisy   [x]    GU:[-]dysruria, [-] gross hematuria   []    Neuro :[-] focal neurological [-] wrist or foot drop     [x]    Imm/All :[-] recurrent infections in past, [-] pollen or seasonal allergy   [x]   Endo :   [x]    Psych: [-] mood, [-] memory     PMHx:  Past Medical History:   Diagnosis Date   ? Acne    ? Arthritis    ? Cerebral palsy (HCC/RAF)    ? Eczema    ? Hypertension    ? Impaired glucose tolerance 03/23/2016    HGBA1C of 5.7 on 03/19/2016    ? Low vitamin D level 05/07/2016    05/07/2016 - taking antiepiletpic which may exacerbate it, started on ergo 50000, will recheck level in about 2 months    ? Overweight    ? Seizure (HCC/RAF) 1973    chronically on Dilantin, no seizure in 25 years, followed by Neurologist (Dr. Wiliam Ke) at [facility]   ? Shortening of arm, congenital     right arm       PSHx:  Past Surgical History:   Procedure Laterality Date   ? rectal abscess  2010    surgery done at [facility]       Meds:    No outpatient medications have been marked as taking for the 06/04/21 encounter (Appointment) with Liana Gerold, MD, MS.       Allergies:  Allergies   Allergen Reactions   ? Topiramate      Eye swelling   ? Sulfa Antibiotics Angioedema     No anaphylaxis   ? Amlodipine Other (See Comments)     Lip swelling without anaphylaxis   ? Carvedilol Angioedema     Lip swelling without anaphylaxis   ? Losartan Rash     No hive, no anaphylaxis, unclear if truly has rash from this medicine   ? Penicillins Rash     ''bumps on body''       SocHx:  Social History     Tobacco Use   ? Smoking status: Never Smoker   ? Smokeless tobacco: Never Used   Substance Use Topics   ? Alcohol use: No     Works in Loss adjuster, chartered; single    FamHx:  Family History   Problem Relation Age of Onset   ? Heart disease Mother    ? Diabetes Mother    ? Kidney disease Mother    ? Stroke Brother    ? Colon cancer Neg Hx    ? Prostate cancer Neg Hx    ? Ovarian cancer Neg Hx    ? Breast cancer Neg Hx    ? Hyperlipidemia Neg Hx    ? Hypertension Neg Hx        Physical Exam:  BP 132/88  ~  Pulse 78  ~ Temp 35.8 ?C (96.4 ?F) (Skin)  ~ Resp 18  ~ Ht 6' (1.829 m)  ~ Wt 213 lb (96.6 kg)  ~ SpO2 97%  ~ BMI 28.89 kg/m?       BP 144/89  ~ Pulse 77  ~ Temp 36.6 ?C (97.8 ?F) (Forehead)  ~ Resp 17  ~ Ht 6' (1.829 m) Comment: pt. self reported height ~ Wt 216 lb (98 kg)  ~ SpO2 97% Comment: Room air ~ BMI 29.29 kg/m?   Pain Information     No pain information on file          BP 143/85  ~ Pulse 78  ~ Temp 36.6 ?C (97.8 ?F) (Oral)  ~ Resp 18  ~ Ht 6' (1.829 m) Comment: Pt. self reported height ~ Wt 211 lb (95.7 kg)  ~ SpO2 97% Comment: Room air ~ BMI 28.62 kg/m?   Pain Information     No pain information on file            BP 134/83  ~ Pulse 70  ~ Temp 36.4 ?C (97.5 ?F) (Oral)  ~ Resp 18  ~ Ht 6' (1.829 m)  ~ Wt 209 lb (94.8 kg)  ~ SpO2 97%  ~ BMI 28.35 kg/m?      BP 128/72  ~ Pulse 70  ~ Temp 36.4 ?C (97.5 ?F) (Oral)  ~ Resp 16  ~ Ht 6' (1.829 m)  ~ Wt 205 lb (93 kg)  ~ SpO2 97%  ~ BMI 27.80 kg/m?     Wt Readings from Last 3 Encounters:   05/30/21 (!) 227 lb (103 kg)   05/09/21 (!) 226 lb 6.4 oz (102.7 kg)   04/04/21 (!) 231 lb 3.2 oz (104.9 kg)     BP Readings from Last 3 Encounters:   05/30/21 151/89   05/09/21 164/92   04/04/21 150/80       NAD  No ocular injection  No alopecia  OP clear  No LAD  CTAB  RRR  S/NT/ND  No C/C/E  No rashes  No telangectasia/discoloration/calcinosis/sclerodactyly  No tender joints  No swollen joints  L knee crepitus+ no effusion  No enthesitis  No dactylitis  No myofascial pain  Neuro grossly intact except R extremities atrophy  Peripheral pulses intact      RIGHT         LEFT   TMJ [] []   ~ [] []  TMJ   Shoulder [] []  ~ [] []  Shoulder                Left box: Tender   Elbow [] []  ~ [] []  Elbow               Right box: Swollen   Wrist [] []  ~ [] []  Wrist   CMC1 [] []  ~ [] []  CMC1       MCP5 MCP4 MCP3 MCP2 MCP1         ~ MCP1 MCP2 MCP3 MCP4 MCP5       [] []  [] []  [] []  [] []  [] []   [] []  [] []  [] []  [] []  [] []        PIP5 PIP4 PIP3 PIP2 IP1 ~ IP1 PIP2 PIP3 PIP4 PIP5       [] []  [] []  [] []  [] []  [] []   [] []  [] []  [] []  [] []  [] []        DIP5 DIP4 DIP3 DIP2  ~  DIP2 DIP3 DIP4 DIP5       [] []  [] []  [] []  [] []     [] []  [] []  [] []  [] []       Sacroiliac []  ~             []   Sacroiliac                Hip  [] []  ~        [] []  Hip                  Knee  [] []  ~        [] []  Knee        Ankle    [] []  ~        [] []  Ankle           MTP5 MTP4 MTP3 MTP2 MTP1 ~ MTP1 MTP2 MTP3 MTP4 MCP5       [] []  [] []  [] []  [] []  [] []   [] []  [] []  [] []  [] []  [] []  Toe5 Toe4 Toe3 Toe2 Toe1 ~ Toe1 Toe2 Toe3 Toe4 Toe5        [] []  [] []  [] []  [] []  [] []   [] []  [] []  [] []  [] []  [] []                      Labs:  Lab Results   Component Value Date    WBC 5.45 11/22/2020    HGB 14.4 11/22/2020    HCT 43.6 11/22/2020    PLT 211 11/22/2020    NEUTABS 3.13 07/27/2019    LYMPHABS 1.70 07/27/2019     Lab Results   Component Value Date    GLUCOSE 105 (H) 11/22/2020    GFREST >89 04/18/2014    CREAT 0.46 (L) 11/22/2020    CALCIUM 9.2 11/22/2020    TOTPRO 7.4 11/22/2020    ALBUMIN 4.6 11/22/2020    AST 22 11/22/2020    ALT 18 11/22/2020    ALKPHOS 97 11/22/2020     @RRCOAGS @  Lab Results   Component Value Date    TSH 1.5 01/21/2017       Rheum labs:  Lab Results   Component Value Date    SRWEST 13 (H) 05/27/2017    SRWEST 15 (H) 11/28/2014     No results found for: CARDLIPIGA, CARDLIPIGG, CARDLIPIGM, ANAAB, ANAABTITER, B2GLYPROIGA, B2GLYPROIGG, B2GLYPROIGM, DRVVT, RNPAB, SMAB, SSA  No results found for: C3, C4, DSDNAAB, NDNAABIFA  No results found for: Lyman Bishop, MPOAB  Lab Results   Component Value Date    CKTOT 68 04/23/2008    URICACID 3.6 11/28/2014     Lab Results   Component Value Date    VITD25OH 31 11/22/2020    VITD25OH 37 08/05/2018     UA:   Lab Results   Component Value Date    PROTCLUR Trace 04/23/2008    BLDUR Negative 04/23/2008    LEUKESTUR Negative 04/23/2008    RBCSUR 5 04/23/2008    WBCSUR 3 04/23/2008       Studies: [x] reviewed [] ordered [] independently reviewed.  Component      Latest Ref Rng & Units 09/16/2018   Segmented Neutrophil      No Reference Range % 1   Lymphocyte      No Reference Range % 1   Monocyte      No Reference Range % 98   Total Cells      cells 100   Fluid Appearance       Viscoid . . .   RBC Count,Joint Fl      No Reference Range /cmm <1,000   WBC Count,Joint Fl      No Reference Range /cmm 282   Crystals,Fluid #1      No Crystals Seen No Crystals Seen       Imaging:    Knees XR  08/2017   Mild osteoarthritis of the left knee.  No significant arthritis of the right knee.    Knees 2007 normal     ASSESSMENT AND PLAN  MC BLOODWORTH is a 65 y.o. male     Knees OA stable on prn prn ibuprofen 800 mg as needed; no GERD; pt takes rarely; recent slight worsening with effusion in L knee s/p CSI improved currently no significant effusion stable on PRN NSAID  Hx of epilepsy stable   pre-diabetic, HTN, HLD  Hx vit D deficiency WNL 02/2017  overweight    RECOMMENDATIONS  - ibuprofen 800 mg as PRN daily for knee pain/sied effects discussed rarely need  - labs NSAID toxicity labs today  - weight reduction OA info  - no need L knee aspiration CSI this visit will continue to monitor no need for now  - continue to loose weight  - explain in detail and all questions answered    Orders Placed This Encounter   ? CBC & Auto Differential   ? Comprehensive Metabolic Panel   ? ibuprofen 800 mg tablet           Orders Placed This Encounter   ? CBC & Auto Differential   ? Comprehensive Metabolic Panel   ? ibuprofen 800 mg tablet         Orders Placed This Encounter   ? Inject/Drain Knee   ? CBC & Auto Differential   ? Comprehensive Metabolic Panel   ? Crystals,Fluid   ? Joint Fluid Cell Count & Differential   ? ibuprofen 800 mg tablet           RTC  3 months or sooner as needed.  CC note to PCP/referred physician        Orders Placed This Encounter   ? ibuprofen 800 mg tablet           IMMUNIZATION STATUS  Immunization History   Administered Date(s) Administered   ? COVID-19, mRNA, (Pfizer - Purple Cap) 30 mcg/0.3 mL 08/23/2020, 09/20/2020   ? Tdap 03/08/2016   ? influenza vaccine IM quadrivalent (Fluarix Quad) (PF) SYR (33 months of age and older) 08/04/2017, 09/08/2018, 08/23/2020   ? influenza vaccine IM quadrivalent (Fluzone Quad) (PF) SYR/SDV (78 months of age and older) 08/06/2016   ? influenza, unspecified formulation 08/06/2016, 08/04/2017     Thank you for allowing me to participate in this patient's care.  If I can be of further service, please contact my office at [phone].  [x]  >50%  of this * minute visit was spent in face-to-face evaluation and counseling the patient.  The above plan of care, diagnosis, orders, and follow-up were discussed with the patient. Questions related to this recommended plan of care were answered  []  Immunosuppression drugs changed. [x]  Risks/benefits/toxicities of meds discussed.   []  Reviewed and summarized old records. []  Requested outside medical records.  []  Discussed with other providers of complex medical conditions and management plans outlined above    Liana Gerold MD Rheumatology Division, [place] [phone])

## 2021-06-02 NOTE — Progress Notes
PATIENT: Dustin Watts  MRN: [id number]  DOB: Jul 27, 1956  DATE OF SERVICE: 06/02/2021    SURGERY ATTENDING PROVIDER: Dr. Mordecai Maes    Reason for Consultation: Umbilical Hernia    HPI: Dustin Watts is a 65 y.o. male with a history of HTN, Cerebral palsy, prediabetes, epilepsy presenting for follow up of umbilical hernia which is currently being observed. The patient initially became aware of his hernia when he presented to an OSH in 2021 for separate chief complaint, was told that he had an umbilical hernia and subsequently referred to [facility] general surgery. He was first seen in surgery clinic in January 2021 and at the time he was hesitant to pursue surgical repair but wanted to be followed in case symptoms worsen. Since then, he has had no significant episodes of pain, discomfort, or episodes of bowel obstruction or incarceration. He has been trying to lose weight under the encouragement of others and remains hesitant towards hernia repair.    Today Dustin Watts notes...    [ ]  main concerns for today's appointment     [ ]  pain? If yes, how managed?  [ ]  discomfort? Worst when?  [ ]  episodes of bowel obstruction or incarceration? (sxs: constipation, no passing gas, abdominal swelling, pain, cramping, etc.)  [ ]  weight loss progress (ask?)  [ ]  thoughts on surgical repair    [ ]  ROS  [ ]  physical exam of hernia    Past Medical History    Past Medical History:   Diagnosis Date   ? Acne    ? Arthritis    ? Cerebral palsy (HCC/RAF)    ? Eczema    ? Hypertension    ? Impaired glucose tolerance 03/23/2016    HGBA1C of 5.7 on 03/19/2016    ? Low vitamin D level 05/07/2016    05/07/2016 - taking antiepiletpic which may exacerbate it, started on ergo 50000, will recheck level in about 2 months    ? Overweight    ? Seizure (HCC/RAF) 1973    chronically on Dilantin, no seizure in 25 years, followed by Neurologist (Dr. Wiliam Ke) at [facility]   ? Shortening of arm, congenital     right arm       Past Surgical History    Past Surgical History:   Procedure Laterality Date   ? rectal abscess  2010    surgery done at [facility] History    Family History   Problem Relation Age of Onset   ? Heart disease Mother    ? Diabetes Mother    ? Kidney disease Mother    ? Stroke Brother    ? Colon cancer Neg Hx    ? Prostate cancer Neg Hx    ? Ovarian cancer Neg Hx    ? Breast cancer Neg Hx    ? Hyperlipidemia Neg Hx    ? Hypertension Neg Hx        Social History    Social History     Socioeconomic History   ? Marital status: Married   ? Number of children: 0   Occupational History   ? 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 Smoking status: Never Smoker   ? Smokeless tobacco: Never Used   Vaping Use   ? Vaping Use: Never used   Substance and Sexual Activity   ? Alcohol use: No   ?  Drug use: No   ? Sexual activity: Not Currently     Partners: Female     Comment: never married (incorrectly documented in Epic)   Other Topics Concern   ? Do you exercise at least a day, 3 or more days a week? No   ? Types of Exercise? (List in Comments) Yes     Comment: biking infrequently, abdominal exercise   ? Do you follow a special diet? No   ? Vegan? No   ? Vegetarian? No   ? Pescatarian? No   ? Lactose Free? No   ? Gluten Free? No   ? Omnivore? Yes       Medications    Current Outpatient Medications   Medication Sig   ? ASPIRIN LOW DOSE 81 MG EC tablet TAKE 1 TABLET BY MOUTH EVERY DAY   ? benzoyl peroxide-erythromycin 5-3% gel Apply to acne once daily..   ? Calcium Carb-Cholecalciferol (OYSTER SHELL CALCIUM W/D) 500-200 MG-UNIT TABS    ? clindamycin 300 mg capsule    ? DILANTIN 100 MG ER capsule TAKE 4 CAPSULES ORALLY AT BEDTIME   ? ibuprofen 600 mg tablet Take 1 tablet (600 mg total) by mouth daily as needed. (Patient not taking: No sig reported)   ? RETIN-A MICRO PUMP 0.04 % gel Apply topically at bedtime.   ? Tretinoin Microsphere (RETIN-A MICRO PUMP) 0.06 % GEL APPLY PEA SIZED AMOUNT TOPICALLY TO THE FACE NIGHTLY. (Patient not taking: No sig reported)     No current facility-administered medications for this visit.       Allergies    Topiramate, Sulfa antibiotics, Amlodipine, Carvedilol, Losartan, and Penicillins      Review of Systems:  A full 14 point review of systems was performed and is negative except as above in the HPI.    Physical Exam:  Vitals:There were no vitals taken for this visit. There is no height or weight on file to calculate BMI.  CONSTITUTIONAL: well developed, well nourished, no acute distress  RESPIRATORY: no distress  CV: RRR  ABD: soft, nontender, nondistended, palpable, non-reducible umbilical hernia without prior incisions  EXT: Warm, well-perfused  NEURO: A&O x 4  SKIN: Dry, no rash  PSYCH: Normal mood/affect    Lab Review:  No results found for this or any previous visit (from the past 24 hour(s)).     No pertinent lab results since last appointment.    Imaging:  No imaging has been resulted in the last 24 hours     No new imaging.     Assessment and Plan:     Dustin Watts is a 65 y.o. 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 BMI *** today, he continues to try and lose weight. We discussed the rationale, risks, benefits and alternatives of surgery and patient has elected to *** continue non-operative management since his symptoms from the hernia are minimal at this point. We gave return precautions and patient will schedule follow up with [place] in 6 months to 1 year to revisit discussion for surgery.    The plan for this patient has been discussed with surgery team and attending physician Dr. Norton Blizzard Deforrest Bogle MS3    This note was authored by medical student, Ileene Rubens, at [facility]. This note should not be consulted for clinical, billing, or medico-legal purposes unless a physician has reviewed this note and added an attestation below regarding the  accuracy of above documentation.

## 2021-06-03 ENCOUNTER — Non-Acute Institutional Stay: Payer: BLUE CROSS/BLUE SHIELD

## 2021-06-04 ENCOUNTER — Non-Acute Institutional Stay: Payer: BLUE CROSS/BLUE SHIELD

## 2021-06-05 ENCOUNTER — Non-Acute Institutional Stay: Payer: Medicaid HMO

## 2021-06-06 ENCOUNTER — Ambulatory Visit: Payer: MEDICAID

## 2021-06-06 NOTE — Treatment Summary
OCCUPATIONAL THERAPY PROGRESS NOTE AND TREATMENT NOTE     Reporting period- 12/19/2020 to 06/06/2021      PATIENT: Dustin Watts  GENDER: male  AGE: 65 y.o.  DOB: October 14, 1956  MRN: [id number]    Diagnosis:     ICD-10-CM    1. Closed dislocation of interphalangeal joint of finger of left hand, initial encounter  S63.279A      Medicare:    Visit number: 20 (next report due visit 30)    Plan of Care Expires: 08/29/2021  Authorization expires:  Medical necessity     Precautions: none    Pain: No       SUBJECTIVE: Patient asking question: ''Will my ring finger ever be straight again? It looks bent.''        OBJECTIVE:     -progress note   -hot pack x 5 minutes to left hand in conjunction with review of home exercise program   -distal and proximal interphalangeal extension stretch with distraction of left 4th digit    -left hand isometric grip strengthening with tennis ball  (1 x 10 repetitions)   -left hand lumbrical strengthening with paddle and pink theraputty   -left hand pink theraputty grip strengthening - squeezes (added to home exercise program)        Ring finger active range of motion 05/16/21  Metacarpal phalangeal (MCP) joint extension/flexion Right 64   Proximal interphalangeal (PIP) joint extension/flexion Right -9 (volar)/80   Distal interphalangeal (DIP) joint extension/flexion Right 50     GRIP (lbs) 2nd rung position 05/16/21  Right: Not assessed    Left: 45.5, 42.5 (with pain)     Access Code: [email or web address] URL: [email or web address]  ? Seated Finger DIP Flexion AROM with Blocking - 2-3 x daily - 7 x weekly - 2 sets - 10 reps   ? Finger PIP Flexion Extension with Blocking - 2-3 x daily - 7 x weekly - 2 sets - 10 reps   ? Seated Finger Composite Flexion Extension - 2-3 x daily - 7 x weekly - 2 sets - 10 reps   ? Seated Finger Composite Flexion Stretch - 2-3 x daily - 7 x weekly - 2 sets - 10 repetitions  ? Putty squeezes- 1 x day x 2-3 weekly- 2 sets - 10 repetitions  -active hook fist while grasping highlighter      Patient Education:   Education Content:   see above, discomfort but not pain expected with exercises, nighttime orthosis , desensitization, digi-sleeve, bending every other finger, extension stretching   Education Delivery Method:   Verbal and demonstration   Education Response:   verbalizes understanding and demonstrates understanding      ASSESSMENT: Patient tolerated treatment well, however, he did feel slight discomfort during lumbrical strengthening with paddle with pink theraputty- this was discontinued. Pink theraputty grip squeezes added to home exercise program (this was upgraded from beige; yellow was too easy per patient). Patient verbalized good understanding and return. Patient misplaced left 4th digit finger tube issued two visits ago- will bring in next time if he finds it.    PLAN:  Follow up on theraputty grip strengthening, strengthening excerises for 4th digit, fine motor tasks, continue lumbrical strengthening, grip strengthening as tolerated, continue to facilitate proximal interphalangeal extension and distal interphalangeal/ proximal interphalangeal flexion, continue with efforts to regain motion    Patient was seen for a total of 30 minutes of treatment time. 30 minutes was in services represented by timed CPT codes.    Mitsuko  J Nobukuni, OT/L        Prior Level of Function:  Able to perform self care with left upper extremity, able to do light cleaning and cooking, difficulty sleeping some nights due to pain at 4th digit  ?  ?  Current Level of Function:  Able to perform self care with left upper extremity, able to do light cleaning and cooking, less pain when sleeping, some difficulty opening jars/containers, difficulty maintaining grasp of heavier objects (such as jars)     Plan of Care:     Problem List:  Decreased range of motion, Decreased strength, Increased edema and Increased pain, Limitations in ability to handle groceries, Limitations in ability to perform driving related tasks, Limitations in ability to perform fine motor activities associated with dressing, Limitations in ability to perform household tasks , Limitations in handling utensils, Limitations in meal preparation, Limitations in opening jars / bottles   ?  Goals:  Short Term Goal:  Patient will learn and implement at least three techniques to decrease finger swelling for ease in grasping objects in 2 sessions. Met   Patient will increase left 4th metacarpophalangeal joint active flexion by at least 30 degrees for ease in performing dressing tasks in 6 weeks. Met  Patient will sleep throughout night with no more than 1-2/10 pain in 6 weeks.?Continue (currently at 3-4/10)  ?  Long Term Goal:  Patient will?demonstrate at least 20-25 pounds of?grip strength for ease in opening jars/bottles in 12?weeks. Met   Patient will perform all self care with modified independence - independence in 12 weeks. Met  Patient will sleep sans finger pain in 12 weeks.?Continue      ?  ASSESSMENT:  65 year old?male?presents with referral diagnosis of Closed dislocation of interphalangeal joint of finger of left hand, initial encounter. Avulsion fracture of middle phalanx of finger, closed, initial encounter?since 08/15/20.  ?  Patient has met 4/6 short and long term goals. Patient able to perform self care and complete light household chores/cooking without limitation. He continues to have moderate pain in 4th digit at night time- this has improved since last progress note. A major concern of his at the moment is holding heavier objects, such as jars, without them falling out of his hand. He is able to identify when this is about to happen and places the object on a surface (as compared to before where the object would fall out of his hand).  Minimal to moderate swelling at proximal interphalangeal joint.  Functional limitations of right upper extremity due to cerebral palsy as well as left digit injury restrict patient's ability to perform some daily tasks including gripping/grasping objects. Strong gains made in left grip strength and patient tolerates putty squeezes well (upgraded theraputty strength from beige to pink).   ?  Symptoms are in the?chronic?phase with mild to moderate?irritability.   ?  The patient's primary impairment(s) of?left?finger?include decreased range of motion, decreased grip and pinch strength, increased pain, increased inflammation, decreased dexterity and decreased coordination?which is limiting the ability to perform sleep, perform meal preparation, complete work activities, complete fitness/wellness routine, perform household tasks and participate in hobbies.?Refer to problem list.   ?  The patient requires skilled Occupational Therapy that can be safely and effective performed only by a qualified therapist to address the above deficits.  ?  Mitsuko J Nobukuni, OT  ?  ?  ?  ?  ?  ?  REHAB POTENTIAL:?Good  ?  OBSTACLES TO REHABILITATION/CO-MORBIDITIES THAT MAY AFFECT GOALS AND  TREATMENT PLAN:?Functional limitations of right upper extremity due to cerebral palsy  ?  Plan:   INTERVENTIONS:?  Therapeutic Exercise, Manual Therapy, Neuromuscular Re-education, Therapeutic Activities, Ultrasound, Traction, Electrical Stimulation, Iontophoresis, Paraffin, Heat Therapy, Cold Therapy, Patient/Caregiver Education, Self Care Training and Light Therapy, [id number], [id number], [phone]  ?  Patient to be seen?1-2?times per week for 12?week(s).  ?  Patient agrees with goals and treatment plan as outlined above:Yes  ?

## 2021-06-13 ENCOUNTER — Ambulatory Visit: Payer: BLUE CROSS/BLUE SHIELD

## 2021-06-13 NOTE — Treatment Summary
OCCUPATIONAL THERAPY PROGRESS NOTE AND TREATMENT NOTE     Reporting period- 12/19/2020 to 06/06/2021      PATIENT: Dustin Watts  GENDER: male  AGE: 65 y.o.  DOB: 1956-10-30  MRN: [id number]    Diagnosis:     ICD-10-CM    1. Closed dislocation of interphalangeal joint of finger of left hand, initial encounter  S63.279A      Medicare:    Visit number: 21 (next report due visit 30)    Plan of Care Expires: 08/29/2021  Authorization expires:  Medical necessity     Precautions: none    Pain:Yes   Pain In: 3/10  Pain Out: 3/10  Location: left 4th digit   Duration: Intermittent  Description: Aching      SUBJECTIVE: Patient's left hand is sore after performing home exercise program. Home exercise program sheet was lost and patient followed his own regimen.       OBJECTIVE:       -hot pack x 5 minutes to left hand in conjunction with review of home exercise program (patient lost sheet; this was reprinted)   -dorsal gliding of left 4th digit   -distal and proximal interphalangeal joint extension stretch of left 4th digit    -left hand lumbrical strengthening with paddle and yellow theraputty (pink theraputty with this specific exercise caused slight discomfort last session)  -removing objects from pink theraputy while opposing left thumb and 4th digit  -2 point pinch strengthening with pink theraputty (left thumb and 4th digit)          Ring finger active range of motion 05/16/21  Metacarpal phalangeal (MCP) joint extension/flexion Right 64   Proximal interphalangeal (PIP) joint extension/flexion Right -9 (volar)/80   Distal interphalangeal (DIP) joint extension/flexion Right 50     GRIP (lbs) 2nd rung position 05/16/21  Right: Not assessed    Left: 45.5, 42.5 (with pain)   ? Access Code: [email or web address]  ?   ? Exercises  ? Seated Finger DIP Flexion AROM with Blocking - 2-3 x daily - 7 x weekly - 2 sets - 10 reps  ? Finger PIP Flexion Extension with Blocking - 2-3 x daily - 7 x weekly - 2 sets - 10 reps  ? Seated Finger Composite Flexion Extension - 2-3 x daily - 7 x weekly - 2 sets - 10 reps  ? Seated Finger Composite Flexion Stretch - 2-3 x daily - 7 x weekly - 2 sets - 10 reps  ? Putty Squeezes - 1 x daily - 2-3 x weekly - 2 sets - 10 reps  -active hook fist while grasping highlighter      Patient Education:   Education Content:   see above, discomfort but not pain expected with exercises, nighttime orthosis , desensitization, digi-sleeve, bending every other finger, extension stretching   Education Delivery Method:   Verbal and demonstration   Education Response:   verbalizes understanding and demonstrates understanding      ASSESSMENT: Overall, patient's left 4th digit has improved since beginning therapy. Treatment tolerated well today. Patient's left hand sore after exercsises at home; home exercise program sheet was lost and patient followed his own regimen. Home exercise program was re-printed and reviewed. Per patient, he has not been wearing his LMB orthosis because it broke- patient to bring in LMB orthosis for left 4th digit for therapist to fix.  Patient would benefit from 3 more occupational therapy sessions to continue stretching, strengthening and work on fine motor tasks.  PLAN:  Update objective measurements, strengthening excerises for 4th digit, fine motor tasks, continue lumbrical strengthening, grip strengthening as tolerated, continue to facilitate proximal interphalangeal extension and distal interphalangeal/ proximal interphalangeal flexion, continue with efforts to regain motion    Patient was seen for a total of 30 minutes of treatment time. 30 minutes was in services represented by timed CPT codes.    Mitsuko J Nob[place], OT/L

## 2021-06-20 ENCOUNTER — Non-Acute Institutional Stay: Payer: BLUE CROSS/BLUE SHIELD

## 2021-06-20 ENCOUNTER — Inpatient Hospital Stay: Payer: BLUE CROSS/BLUE SHIELD

## 2021-06-20 DIAGNOSIS — M79672 Pain in left foot: Secondary | ICD-10-CM

## 2021-06-20 DIAGNOSIS — G40909 Epilepsy, unspecified, not intractable, without status epilepticus: Secondary | ICD-10-CM

## 2021-06-20 DIAGNOSIS — M7989 Other specified soft tissue disorders: Secondary | ICD-10-CM

## 2021-06-20 DIAGNOSIS — T792XXA Traumatic secondary and recurrent hemorrhage and seroma, initial encounter: Secondary | ICD-10-CM

## 2021-06-20 DIAGNOSIS — M79671 Pain in right foot: Secondary | ICD-10-CM

## 2021-06-23 ENCOUNTER — Non-Acute Institutional Stay: Payer: Medicaid HMO

## 2021-06-27 ENCOUNTER — Non-Acute Institutional Stay: Payer: BLUE CROSS/BLUE SHIELD

## 2021-07-01 ENCOUNTER — Ambulatory Visit: Payer: BLUE CROSS/BLUE SHIELD

## 2021-07-01 NOTE — Progress Notes
PATIENT: Dustin Watts  MRN: [id number]  DOB: 05/15/1956  DATE OF SERVICE: 07/01/2021    CHIEF COMPLAINT:   Chief Complaint   Patient presents with   ? Foot     Right foot discomfort    ? Hand Pain     Left hand pain / redness         History of Present Illness  Dustin Watts is a 66 y.o. male presents for:     Foot mass  - seroma on R foot  - recurred again  - requesting repeat drainage  - not wrapping    L wrist  - swollen and tender in the last month  - symptoms resolved but has mild bruise now  - cannot recall trrauma  - no pain currently  - no other bruising on the body    ROS  No fever, no vomiting, no diarrhea, no bleeding    Objective (click to expand/collapse)     Past Medical History:   Diagnosis Date   ? Acne    ? Arthritis    ? Cerebral palsy (HCC/RAF)    ? Eczema    ? Hypertension    ? Impaired glucose tolerance 03/23/2016    HGBA1C of 5.7 on 03/19/2016    ? Low vitamin D level 05/07/2016    05/07/2016 - taking antiepiletpic which may exacerbate it, started on ergo 50000, will recheck level in about 2 months    ? Overweight    ? Seizure (HCC/RAF) 1973    chronically on Dilantin, no seizure in 25 years, followed by Neurologist (Dr. Wiliam Ke) at [facility]   ? Shortening of arm, congenital     right arm       Medications that the patient states to be currently taking   Medication Sig   ? ASPIRIN LOW DOSE 81 MG EC tablet TAKE 1 TABLET BY MOUTH EVERY DAY   ? benzoyl peroxide-erythromycin 5-3% gel Apply to acne once daily..   ? Calcium Carb-Cholecalciferol (OYSTER SHELL CALCIUM W/D) 500-200 MG-UNIT TABS    ? clindamycin 300 mg capsule    ? DILANTIN 100 MG ER capsule TAKE 4 CAPSULES ORALLY AT BEDTIME   ? RETIN-A MICRO PUMP 0.04 % gel Apply topically at bedtime.       Physical Exam  Vitals:    07/01/21 0959   BP: 146/87   Pulse: 83   Resp: 16   Temp: 36.3 ?C (97.3 ?F)   TempSrc: Tympanic   SpO2: 97%   Weight: (!) 223 lb 12.8 oz (101.5 kg)   Height: 6' (1.829 m)     Wt Readings from Last 3 Encounters: 07/01/21 (!) 223 lb 12.8 oz (101.5 kg)   05/30/21 (!) 227 lb (103 kg)   05/09/21 (!) 226 lb 6.4 oz (102.7 kg)     Body mass index is 30.35 kg/m?Marland Kitchen  System Check if normal Positive or additional negative findings   Constit  [x]  General appearance     Eyes  []  Conj/Lids []  Pupils  []  Fundi     HENMT  []  External ears/nose []  TMs (bilateral)   []  Gross Hearing []  Nasal mucosa   []  Lips/teeth/gums []  Oropharynx    []  Mucus membranes []  Head     Neck  []  Inspection/palpation []  Thyroid     Resp  [x]  Effort []  No wheezing    []  Auscultation  []  No crackles     CV  []  Rhythm/rate []  Murmurs []  LEE    []   JVP non-elevated []  Radial pulses     Breast  []  Inspection []  Palpation     GI  []  No abd masses  []  No tenderness   []  No rebound/guarding  []  Liver/spleen      GU  []  Scrotum []  Penis []  Prostate      Lymph  Normal: []  Neck []  Axillae []  Groin     MSK site examined:    []  Inspect/palp []  ROM []  Strength/tone medial L wrist with 2cm ecchymosis without tenderness or edema    5cm soft nontender mass on the upper R foot   Skin  []  Inspection []  Palpation  Site:     Neuro  []  CN2-12 intact grossly   []  Alert and oriented   []  Muscle strength - legs     []  Sensation  []  Gait/balance     Psych  [x]  Insight/judgement  []  Mood/affect        I have:   [x]  Reviewed/ordered []  1 []  2 [x]  ? 3 unique laboratory, radiology, and/or diagnostic tests noted below       Lab Studies:  Lab Results   Component Value Date    NA 139 11/22/2020    K 4.5 11/22/2020    CL 102 11/22/2020    CO2 22 11/22/2020    BUN 9 11/22/2020    CREAT 0.46 (L) 11/22/2020    GLUCOSE 105 (H) 11/22/2020    CALCIUM 9.2 11/22/2020    ALT 18 11/22/2020    AST 22 11/22/2020    ALKPHOS 97 11/22/2020    BILITOT 0.2 11/22/2020     Lab Results   Component Value Date    WBC 5.45 11/22/2020    HGB 14.4 11/22/2020    HCT 43.6 11/22/2020    MCV 101.4 (H) 11/22/2020    PLT 211 11/22/2020     Lab Results   Component Value Date    HGBA1C 6.0 (H) 11/22/2020     Lab Results Component Value Date    TSH 1.5 01/21/2017         Imaging Studies:   [place] lower extremity lump or bump non-vascular right    Result Date: 11/21/2020  IMPRESSION: Complex hypoechoic fluid collection measuring 5.4 cm corresponding to the area of concern in the anterior right foot, may relate to an evolving hematoma or other complex collection; superinfection not excluded. Correlate with visual inspection and for prior history of trauma. If clinically indicated, aspiration could be considered. Signed by: Ledon Snare   11/21/2020 10:16 AM         Assessment and Plan       ICD-10-CM    1. 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)  T79.2XXA          Ecchymosis on L wrist - resolved, no additional workup requires, will monitor    Problem   Seroma Due to Trauma (Hcc/Raf)    Seroma  08/23/2020 - 5cm soft tissue nodule/lump,    11/22/2020 - US showed 5cm fluid collection, drained today with 18 gauge needle without complication producing mildly serosanguinous fluid  01/03/2021 - recurred, drained 10ml with 23 gauge needle without complication, serosanguinous fluid  02/04/2021 - recurred, drained again without complication same as before, referral to surgery placed, advised wrapping consistently for 2 weeks  04/04/2021 - recent repeat draining with PCP and podiatry, has been wrapping it but recurred again, drained today without complication, will f/u with podiatry again for discussion of surgical removal  05/09/2021 - again repeat drainage,  wrapped, counseled on f/u with podiatry for discussion of definitive therapy  05/30/2021 - repeat drainage today, will directly contact Podiatry for expert opinion if there is definitive therapy. Keep wrapped for 2 weeks minimum  07/01/2021 - repeat drainage again today, advised that surgical management is needed for definitive therapy       PROCEDURE NOTE:?Seroma drainage  Performed by:?Lendon Collar Onika Gudiel  Indication:?recurrent?seroma  Area cleaned thoroughly with alcohol swab. ?25?gauge needle used to administer 0.21ml of Xilocaine 2% for pain control, then 18 gauge needle used to?to drain seroma on the upper R foot. ?Return was?5.5?ml of clear serous?fluid. A dressing was placed over the site?and wrapped. The patient tolerated the procedure well. Wound care instructions were given.   Post-Procedure Diagnosis: same as indication  Complications: none  Estimated Blood Loss:?trace            The above recommendation were discussed with the patient.  The patient has all questions answered satisfactorily and is in agreement with this recommended plan of care.    Return if symptoms worsen or fail to improve.     Author:  Lendon Collar. Deshawna Mcneece 07/01/2021 10:45 AM  Time of note filed does not necessarily reflect the time of encounter.  Portions of this note may have been created with voice recognition software. Occasional wrong-word or ''sound-alike'' substitutions may have occurred due to the inherent limitations of voice recognition software. Please read the chart carefully and recognize, using context, where these substitutions have occurred.  [id number] - I spent 30-39 minutes (established patient). When time (rather than medical decision making) is used for determination of Level of Service for this encounter, the time includes any time spent reviewing the chart on the day of the visit, time spent face to face with the patient, time spent documenting the visit on the day of the visit, and may include counseling and coordination of care also performed on the day of the visit.

## 2021-07-04 ENCOUNTER — Ambulatory Visit: Payer: BLUE CROSS/BLUE SHIELD

## 2021-07-04 NOTE — Treatment Summary
OCCUPATIONAL THERAPY TREATMENT NOTE       PATIENT: Dustin Watts  GENDER: male  AGE: 65 y.o.  DOB: August 01, 1956  MRN: [id number]    Diagnosis:     ICD-10-CM    1. Closed dislocation of interphalangeal joint of finger of left hand, initial encounter  S63.279A      Medicare:    Visit number: 22 (next report due visit 30)    Plan of Care Expires: 08/29/2021  Authorization expires:  Medical necessity     Precautions: none    Pain:Yes   Pain In: 5/10  Pain Out: 4/10  Location: left 4th digit   Duration: Intermittent  Description: Aching      SUBJECTIVE: Patient's left 4th digit has been painful off and on for the past few days, he did not do anything in particular to cause increased pain. Noticed a click or snap in finger and feels it got straighter after this.       OBJECTIVE:     -update left 4th digit range of motion   -hot pack x 5 minutes to left digits/hand  -therapist fixed damaged LMB orthosis for left 4th digit - padding and thermoplastic added to reconstruct ends where foam was located   -dorsal gliding of left 4th digit proximal interphalangeal joint with extension stretch  -hook fist with highlighter then rolling down palm (hold hook fist stretch for 10 seconds)   -left hand lumbrical strengthening with paddle and pink theraputty        Ring finger active range of motion 07/04/21  Metacarpal phalangeal (MCP) joint extension/flexion Right 66   Proximal interphalangeal (PIP) joint extension/flexion Right -9 (volar)/82     Distal interphalangeal (DIP) joint extension/flexion Right 56     GRIP (lbs) 2nd rung position 05/16/21  Right: Not assessed    Left: 45.5, 42.5 (with pain)   ? Access Code: [email or web address]  ?   ? Exercises  ? Seated Finger DIP Flexion AROM with Blocking - 2-3 x daily - 7 x weekly - 2 sets - 10 reps  ? Finger PIP Flexion Extension with Blocking - 2-3 x daily - 7 x weekly - 2 sets - 10 reps  ? Seated Finger Composite Flexion Extension - 2-3 x daily - 7 x weekly - 2 sets - 10 reps  ? Seated Finger Composite Flexion Stretch - 2-3 x daily - 7 x weekly - 2 sets - 10 reps  ? Putty Squeezes - 1 x daily - 2-3 x weekly - 2 sets - 10 reps  -active hook fist while grasping highlighter      Patient Education:   Education Content:   see above, discomfort but not pain expected with exercises, nighttime orthosis , desensitization, digi-sleeve, bending every other finger, extension stretching   Education Delivery Method:   Verbal and demonstration   Education Response:   verbalizes understanding and demonstrates understanding      ASSESSMENT: Patient's left 4th digit continues to make functional improvements. Treatment tolerated well. Updated range of motion measurements- slight increase in left 4th digit flexion of metacarpophalangeal, proximal interphalangeal and distal interphalangeal joints. Extension of proximal interphalangeal joint remains the same. Therapist fixed damaged LMB orthosis; patient tried this on at end of session and it fit well. Instructed patient  to wear for 30 minutes a day (but not necessarily needed considering good extension of proximal interphalangeal joint), per patient question. Patient would benefit from 2 more occupational therapy sessions to continue stretching, strengthening and work on fine  motor tasks.       PLAN:  Follow up on LMB orthosis modifications, strengthening excerises for 4th digit, fine motor tasks, continue lumbrical strengthening, grip strengthening as tolerated, continue to facilitate proximal interphalangeal extension and distal interphalangeal/ proximal interphalangeal flexion, continue with efforts to regain motion    Patient was seen for a total of 30 minutes of treatment time. 25 minutes was in services represented by timed CPT codes.    Mitsuko J [place], OT/L

## 2021-07-08 ENCOUNTER — Non-Acute Institutional Stay: Payer: BLUE CROSS/BLUE SHIELD

## 2021-07-11 ENCOUNTER — Ambulatory Visit: Payer: BLUE CROSS/BLUE SHIELD

## 2021-07-11 DIAGNOSIS — L578 Other skin changes due to chronic exposure to nonionizing radiation: Secondary | ICD-10-CM

## 2021-07-11 DIAGNOSIS — L700 Acne vulgaris: Secondary | ICD-10-CM

## 2021-07-11 MED ORDER — SULFACETAMIDE SODIUM-SULFUR 9-4.5 % EX LIQD
11 refills | Status: AC
Start: 2021-07-11 — End: ?

## 2021-07-11 MED ORDER — RETIN-A MICRO PUMP 0.06 % EX GEL
ORAL | 11 refills | 30.00000 days | Status: AC
Start: 2021-07-11 — End: ?

## 2021-07-11 MED ORDER — BENZOYL PEROXIDE-ERYTHROMYCIN 5-3 % EX GEL
11 refills | Status: AC
Start: 2021-07-11 — End: ?

## 2021-07-11 NOTE — Progress Notes
PATIENT: Dustin Watts  MRN: [id number]  DOB: [birth year]  DATE OF SERVICE: 07/11/2021    REFERRING PRACTITIONER: No ref. provider found  PRIMARY CARE PROVIDER: Murtis Sink., MD, MPH  CHIEF COMPLAINT:   Chief Complaint   Patient presents with   ? Acne       Subjective:     Dustin Watts is a 65 y.o. year old male who was seen in clinic for follow up acne and lichen simplex chronicus.    Last Visits with Me     Date Department Visit Type Primary Dx    10/10/2020 [facility] [place] Office Visit Acne vulgaris    10/19/2019 [facility] [place] Office Visit Acne vulgaris    01/12/2019 [facility] [place] Office Visit Acne vulgaris    08/18/2017 [facility] [place] Office Visit Acne vulgaris    02/17/2017 [facility]  Office Visit Acne vulgaris           Needs refills - on his last ones.    Wants retin-a micro pump 0.06%.     On sulfacetamide sulfur wash (tolerates fine despite h/o sulfa allergy) and benzamycin gel daily as needed.    Wondering about his left hand- it is more wrinkly and aged in the past year or so as his sister always is applying hand sanitizer for him.      Past Medical History:   Diagnosis Date   ? Acne    ? Arthritis    ? Cerebral palsy (HCC/RAF)    ? Eczema    ? Hypertension    ? Impaired glucose tolerance 03/23/2016    HGBA1C of 5.7 on 03/19/2016    ? Low vitamin D level 05/07/2016    05/07/2016 - taking antiepiletpic which may exacerbate it, started on ergo 50000, will recheck level in about 2 months    ? Overweight    ? Seizure (HCC/RAF) 1973    chronically on Dilantin, no seizure in 25 years, followed by Neurologist (Dr. Wiliam Ke) at [facility]   ? Shortening of arm, congenital     right arm     Past Surgical History:   Procedure Laterality Date   ? rectal abscess  2010    surgery done at [facility] History   Problem Relation Age of Onset   ? Heart disease Mother    ? Diabetes Mother    ? Kidney disease Mother    ? Stroke Brother    ? Colon cancer Neg Hx    ? Prostate cancer Neg Hx    ? Ovarian cancer Neg Hx    ? Breast cancer Neg Hx    ? Hyperlipidemia Neg Hx    ? Hypertension Neg Hx        Outpatient Medications Prior to Visit   Medication Sig   ? ASPIRIN LOW DOSE 81 MG EC tablet TAKE 1 TABLET BY MOUTH EVERY DAY   ? benzoyl peroxide-erythromycin 5-3% gel Apply to acne once daily..   ? Calcium Carb-Cholecalciferol (OYSTER SHELL CALCIUM W/D) 500-200 MG-UNIT TABS    ? clindamycin 300 mg capsule    ? DILANTIN 100 MG ER capsule TAKE 4 CAPSULES ORALLY AT BEDTIME   ? RETIN-A MICRO PUMP 0.04 % gel Apply topically at bedtime.   ? ibuprofen 600 mg tablet Take 1 tablet (600 mg total) by mouth daily as needed. (Patient  not taking: No sig reported)   ? Tretinoin Microsphere (RETIN-A MICRO PUMP) 0.06 % GEL APPLY PEA SIZED AMOUNT TOPICALLY TO THE FACE NIGHTLY. (Patient not taking: No sig reported)     No facility-administered medications prior to visit.       Allergies   Allergen Reactions   ? Topiramate      Eye swelling   ? Sulfa Antibiotics Angioedema     No anaphylaxis   ? Amlodipine Other (See Comments)     Lip swelling without anaphylaxis   ? Carvedilol Angioedema     Lip swelling without anaphylaxis   ? Losartan Rash     No hive, no anaphylaxis, unclear if truly has rash from this medicine   ? Penicillins Rash     ''bumps on body''         Review of Systems:  Constitutional: negative  Skin:  otherwise negative      Objective:      BP 149/88  ~ Pulse 86  ~ Temp 36.4 ?C (97.6 ?F) (Tympanic)      General:   alert, appears stated age and cooperative     Neurologic:   Grossly normal   Psychiatric:   oriented to time, place and person, mood and affect are within normal limits     Skin Exam:  Skin Type: 4  Head/face: examined  Eyes (lids/conjunctiva): examined  Lips/gums/teeth: examined  Neck: examined  L upper extremity: examined  All areas examined were within normal limits with the following exceptions: Scattered skin colored papules on face - stable. Few erythematous papules on nose.    Left dorsal hand with rhytids and tan macules with regular appearance.         Assessment:           Plan/ Recommendation:        Diagnoses and associated orders for this visit:    Acne - chronic, stable, though mild flare currently. overall doing well, continue:  - benzoyl peroxide-erythromycin 5-3% gel; Apply to acne once daily.  - tretinoin microspheres (RETIN-A MICRO) 0.06% gel; Apply nightly to affected areas.  - Sulfacetamide Sodium-Sulfur (SUMADAN WASH) 9-4.5 % LIQD; Apply 1 drop topically two (2) times daily. WASH AFFECTED AREA(S) TWICE DAILY AS DIRECTED    Refilled - printed prescriptions given per patient preference.    Left hand photoaging/rhytids -  Chronic condition, worsening. Emollients encouraged and can use retin-a micro here nightly as well.     Assessment and plan were discussed with the patient.     Follow up: 6-12 months or sooner prn     Author:  Arbutus Ped 07/11/2021 9:35 AM

## 2021-07-11 NOTE — Patient Instructions
SUNSCREEN RECOMMENDATIONS    The best sunscreen is the one you will wear consistently.    Wear sunscreen SPF 30 or higher every day, sun penetrates window glass, you can incur damage while in your car or office  Some light brands for daily use on the face I like are:  Replenix Sheer Physical SPF 50+  Elta MD UV Clear SPF 46  Some thick brands for use with physical activity or water activity (reapply every few hours, no matter what SPF):  Vanicream (any 30 or higher with 80 minutes water resistance)  Solbar Shield SPF 40    Sunscreens that contain iron oxide (helpful for skin discoloration):  - Skinceuticals Physical Fusion UV defense SPF 50  - Tizo 3 Tinted Face Mineral SPF 40 Sunscreen  - Neostrata Sheer physical protection SPF 50  - Exuviance sheer daily protection SPF 50 PA++++  - Supergoop! Daily Correct CC Cream SPF 35  - SkinMedica Essential Defense Mineral Shield SPF 32  - Dermalogica Light Sheer Tint Moisture SPF 20 Face Moisturiser  - Cotz Face Natural Skin Tone SPF 40  - MDSolarSciences Mineral Crme Broad Spectrum SPF 50 Sunscreen    Sun Protection Guidelines:  - Avoid sun exposure between 10 am-2 pm, when the sun is the strongest. If you are doing outdoor activities, try to do them early morning or later afternoon / early evening.   - Sunscreens have been proven to decrease skin cancers (including melanoma) and photoaging.     Sunscreens:  - The American Academy of Dermatology recommends ''SPF 30 - Broad Spectrum'' or higher SPF.   - Apply sunscreen 15 minutes before sun exposure.   - Reapply every 2 hours, especially when outdoors. Sunscreen is less effective after 2 hours.   - Sunblock with zinc and titanium dioxide are the least irritating especially for sensitive skin (rosacea, eczema, seborrheic dermatitis).

## 2021-07-22 ENCOUNTER — Non-Acute Institutional Stay: Payer: MEDICAID

## 2021-07-22 NOTE — Treatment Summary
OCCUPATIONAL THERAPY TREATMENT NOTE       PATIENT: Dustin Watts  GENDER: male  AGE: 65 y.o.  DOB: August 13, 1956  MRN: [id number]    Diagnosis:   No diagnosis found.  Medicare:    Visit number: Visit count could not be calculated. Make sure you are using a visit which is associated with an episode. (next report due visit 30)    Plan of Care Expires: 08/29/2021  Authorization expires:  Medical necessity     Precautions: none    Pain:Yes   Pain In: 5/10  Pain Out: 4/10  Location: left 4th digit   Duration: Intermittent  Description: Aching      SUBJECTIVE: Patient's left 4th digit has been painful off and on for the past few days, he did not do anything in particular to cause increased pain. Noticed a click or snap in finger and feels it got straighter after this.       OBJECTIVE:     -update left 4th digit range of motion   -hot pack x 5 minutes to left digits/hand  -therapist fixed damaged LMB orthosis for left 4th digit - padding and thermoplastic added to reconstruct ends where foam was located   -dorsal gliding of left 4th digit proximal interphalangeal joint with extension stretch  -hook fist with highlighter then rolling down palm (hold hook fist stretch for 10 seconds)   -left hand lumbrical strengthening with paddle and pink theraputty        Ring finger active range of motion 07/04/21  Metacarpal phalangeal (MCP) joint extension/flexion Right 66   Proximal interphalangeal (PIP) joint extension/flexion Right -9 (volar)/82     Distal interphalangeal (DIP) joint extension/flexion Right 56     GRIP (lbs) 2nd rung position 05/16/21  Right: Not assessed    Left: 45.5, 42.5 (with pain)   ? Access Code: [email or web address]  ?   ? Exercises  ? Seated Finger DIP Flexion AROM with Blocking - 2-3 x daily - 7 x weekly - 2 sets - 10 reps  ? Finger PIP Flexion Extension with Blocking - 2-3 x daily - 7 x weekly - 2 sets - 10 reps  ? Seated Finger Composite Flexion Extension - 2-3 x daily - 7 x weekly - 2 sets - 10 reps  ? Seated Finger Composite Flexion Stretch - 2-3 x daily - 7 x weekly - 2 sets - 10 reps  ? Putty Squeezes - 1 x daily - 2-3 x weekly - 2 sets - 10 reps  -active hook fist while grasping highlighter      Patient Education:   Education Content:   see above, discomfort but not pain expected with exercises, nighttime orthosis , desensitization, digi-sleeve, bending every other finger, extension stretching   Education Delivery Method:   Verbal and demonstration   Education Response:   verbalizes understanding and demonstrates understanding      ASSESSMENT: Patient's left 4th digit continues to make functional improvements. Treatment tolerated well. Updated range of motion measurements- slight increase in left 4th digit flexion of metacarpophalangeal, proximal interphalangeal and distal interphalangeal joints. Extension of proximal interphalangeal joint remains the same. Therapist fixed damaged LMB orthosis; patient tried this on at end of session and it fit well. Instructed patient  to wear for 30 minutes a day (but not necessarily needed considering good extension of proximal interphalangeal joint), per patient question. Patient would benefit from 2 more occupational therapy sessions to continue stretching, strengthening and work on fine motor tasks.  PLAN:  Follow up on LMB orthosis modifications, strengthening excerises for 4th digit, fine motor tasks, continue lumbrical strengthening, grip strengthening as tolerated, continue to facilitate proximal interphalangeal extension and distal interphalangeal/ proximal interphalangeal flexion, continue with efforts to regain motion    Patient was seen for a total of 30 minutes of treatment time. 25 minutes was in services represented by timed CPT codes.    Lukah Goswami J Odena Mcquaid, OT/L

## 2021-07-30 ENCOUNTER — Ambulatory Visit: Payer: MEDICAID

## 2021-07-30 DIAGNOSIS — M7752 Other enthesopathy of left foot: Secondary | ICD-10-CM

## 2021-07-30 DIAGNOSIS — M7989 Other specified soft tissue disorders: Secondary | ICD-10-CM

## 2021-07-30 DIAGNOSIS — G40909 Epilepsy, unspecified, not intractable, without status epilepticus: Secondary | ICD-10-CM

## 2021-07-30 DIAGNOSIS — M79672 Pain in left foot: Secondary | ICD-10-CM

## 2021-07-30 DIAGNOSIS — M79671 Pain in right foot: Secondary | ICD-10-CM

## 2021-07-30 NOTE — Addendum Note
Addended by: Sandre Kitty on: 07/30/2021 12:12 PM     Modules accepted: Orders, Level of Service

## 2021-07-30 NOTE — Progress Notes
PATIENT:Dustin Watts  WJX:[id number]  DOB:09-17-1956  DATE OF SERVICE: 07/30/2021     Referring physician: Murtis Sink., MD, MPH    CHIEF COMPLAINT   Mass on the the right foot    HISTORY OF PRESENT ILLNESS   Interval history 07/30/21: followup  B/l foot pain. Right foot seroma has returned and patient continues to have pain to left plantar forefoot. Patient would like to review MRI results left foot.    04/21/21: followup right foot mass s/p needle aspiration on 03/26/21. Seroma has started to gradually come back and cause some discomfort.    03/26/21: followup  Right foot mass. Patient continues to have discomfort to the foot. Patient would like to review US findings.    The patient is a very pleasant 65 y.o. male who complains of a mass to the dorsal  right foot which is not painful.  It bothers the patient in closed shoes and has been   present for few years.   No history of trauma to the foot.  Prior treatment for this mass include: prior needle aspiration right foot x2  Patient does admit to trauma a few years ago when he first noticed the mass    PAST MEDICAL HISTORY     Past Medical History:   Diagnosis Date   ? Acne    ? Arthritis    ? Cerebral palsy (HCC/RAF)    ? Eczema    ? Hypertension    ? Impaired glucose tolerance 03/23/2016    HGBA1C of 5.7 on 03/19/2016    ? Low vitamin D level 05/07/2016    05/07/2016 - taking antiepiletpic which may exacerbate it, started on ergo 50000, will recheck level in about 2 months    ? Overweight    ? Seizure (HCC/RAF) 1973    chronically on Dilantin, no seizure in 25 years, followed by Neurologist (Dr. Wiliam Ke) at [facility]   ? Shortening of arm, congenital     right arm       MEDICATIONS     Outpatient Medications Prior to Visit   Medication Sig   ? ASPIRIN LOW DOSE 81 MG EC tablet TAKE 1 TABLET BY MOUTH EVERY DAY   ? benzoyl peroxide-erythromycin 5-3% gel Apply to acne once daily..   ? Calcium Carb-Cholecalciferol (OYSTER SHELL CALCIUM W/D) 500-200 MG-UNIT TABS    ? clindamycin 300 mg capsule    ? DILANTIN 100 MG ER capsule TAKE 4 CAPSULES ORALLY AT BEDTIME   ? ibuprofen 600 mg tablet Take 1 tablet (600 mg total) by mouth daily as needed. (Patient not taking: No sig reported)   ? RETIN-A MICRO PUMP 0.04 % gel Apply topically at bedtime.   ? sulfacetamide-sulfur 9-4.5 % LIQD liquid Apply to face twice daily.   ? Tretinoin Microsphere (RETIN-A MICRO PUMP) 0.06 % GEL APPLY PEA SIZED AMOUNT TOPICALLY TO THE FACE NIGHTLY.     No facility-administered medications prior to visit.       ALLERGIES     Allergies   Allergen Reactions   ? Topiramate      Eye swelling   ? Sulfa Antibiotics Angioedema     No anaphylaxis   ? Amlodipine Other (See Comments)     Lip swelling without anaphylaxis   ? Carvedilol Angioedema     Lip swelling without anaphylaxis   ? Losartan Rash     No hive, no anaphylaxis, unclear if truly has rash from this medicine   ? Penicillins Rash     ''  bumps on body''       SURGICAL HISTORY     Past Surgical History:   Procedure Laterality Date   ? rectal abscess  2010    surgery done at [facility]         SOCIAL HISTORY     Social History     Socioeconomic History   ? Marital status: Married   ? Number of children: 0   Occupational History   ? 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 Smoking status: Never Smoker   ? Smokeless tobacco: Never Used   Vaping Use   ? Vaping Use: Never used   Substance and Sexual Activity   ? Alcohol use: No   ? Drug use: No   ? Sexual activity: Not Currently     Partners: Female     Comment: never married (incorrectly documented in Epic)   Other Topics Concern   ? Do you exercise at least a day, 3 or more days a week? No   ? Types of Exercise? (List in Comments) Yes     Comment: biking infrequently, abdominal exercise   ? Do you follow a special diet? No   ? Vegan? No   ? Vegetarian? No   ? Pescatarian? No   ? Lactose Free? No   ? Gluten Free? No   ? Omnivore? Yes       FAMILY HISTORY Family History   Problem Relation Age of Onset   ? Heart disease Mother    ? Diabetes Mother    ? Kidney disease Mother    ? Stroke Brother    ? Colon cancer Neg Hx    ? Prostate cancer Neg Hx    ? Ovarian cancer Neg Hx    ? Breast cancer Neg Hx    ? Hyperlipidemia Neg Hx    ? Hypertension Neg Hx          PHYSICAL EXAM     GEN: No acute distress.  Demonstrates appropriate mood and affect.    The patient looks stated age and appears well nourished.    VASC:  Dorsalis Pedis:  Palpable 2/4 bilateral feet  Posterior Tibial:  Palpable 2/4 bilateral feet  There is brisk capillary refill time to all digits less than 3 seconds.   There are no varicosities or telangiectasias to the ankles  Edema is absent bilaterally  Ecchymosis is not observed    DERM:   Skin tone and turgor are within normal limits.  There is no visible erythema.      NEURO:  Light touch and protective threshold sensation with monofilament wire testing is grossly intact to both feet. No motor deficits noted with resisted range of motion to the hindfoot or toes.    MSK:  No change in MSK exam other than smaller mass from previous visit  No gross musculoskeletal deformities noted  There is a sessile soft tissue mass that is not tender to palpation over the right dorsal foot  There is no change in pigment to the overlying skin.  No surrounding erythema, edema or ecchymosis.    No pain with range of motion to any of the joints about the foot or ankle.  Noted hard small mass mobile no fluid noted sub 2nd metatarsal no tendernesstopalpation no lesions noted on the skin  Right foot inversion noted      IMAGING  06/20/21 left foot MRI  IMPRESSION:   1.  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  2.  Tenosynovitis of the extensor digitorum longus tendons.   3.  Nonspecific muscle edema within the plantar aspect of the forefoot.  ?  ?  Right and left foot [place]    IMPRESSION:   ?  1.  No discrete soft tissue mass at the plantar aspect of the left foot.  ?  2.  Complex fluid collection within the dorsal tissues of the right foot, possibly representing a chronic seroma, hematoma or a posttraumatic adventitial bursa. In review of the electronic medical record, this has previously been aspirated.  ?  Signed by: [place]   ASSESSMENT/PLAN     NAMEER SUMMER is a 65 y.o. male who presents with:    1. Soft tissue mass     2. Pain in both feet     3. 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 Nonintractable epilepsy without status epilepticus, unspecified epilepsy type (HCC/RAF)     5. Bursitis of left foot      discussion of findings  Review of left foot MRI  Left 3rd interspace injection 1cc 1:1 celestone, 0.5% marcaine plain   Right foot cyst discussion  Discussed possibility of surgical exploration  Recommend MRI prior to r/out underlying injury that has not healed from previous trauma  Local block of 3cc 1:1 0.5% marcaine, 1% lidocaine plain given to dorsal right foot  Foot was prepped with DuraPrep  18 g needle was used to evacuate 7cc of serous fluid then compressive dressing was placed  Left LE compression dressing applied to be taken off by patient in 7 days to help prevent recurrence of mass  Fluid mass was sent to pathology today  Followup  PRN    Approximately 25 minutes was spent in reviewing the patients medical records and imaging studies, obtaining the patients medical history, performing the physical exam, discussing with the patient his condition, formulating a treatment plan, coordinating care, placing orders, and/or preparing the medical record note.  At least 50% of this time was spent in face to face interaction with the patient.    Sandre Kitty, DPM  07/30/2021

## 2021-08-01 NOTE — Addendum Note
Addended by: Sandre Kitty on: 08/01/2021 09:29 AM     Modules accepted: Orders

## 2021-08-05 ENCOUNTER — Ambulatory Visit: Payer: BLUE CROSS/BLUE SHIELD

## 2021-08-05 DIAGNOSIS — Z23 Encounter for immunization: Secondary | ICD-10-CM

## 2021-08-05 NOTE — Progress Notes
PATIENT: Dustin Watts  MRN: [id number]  DOB: 1956-10-07  DATE OF SERVICE: 08/05/2021    CHIEF COMPLAINT:   Chief Complaint   Patient presents with   ? Follow-up        History of Present Illness  Dustin Watts is a 65 y.o. male presents for:     Foot seroma  - R foot  - recurrent and chronic  - seeing podiatry including last week , 7cc aspiration  - podiatry ordered MRI  - seroma continued to recur  - aspirate was sent for analysis (see below)    Objective (click to expand/collapse)     Past Medical History:   Diagnosis Date   ? Acne    ? Arthritis    ? Cerebral palsy (HCC/RAF)    ? Eczema    ? Hypertension    ? Impaired glucose tolerance 03/23/2016    HGBA1C of 5.7 on 03/19/2016    ? Low vitamin D level 05/07/2016    05/07/2016 - taking antiepiletpic which may exacerbate it, started on ergo 50000, will recheck level in about 2 months    ? Overweight    ? Seizure (HCC/RAF) 1973    chronically on Dilantin, no seizure in 25 years, followed by Neurologist (Dr. Wiliam Ke) at [facility]   ? Shortening of arm, congenital     right arm       Medications that the patient states to be currently taking   Medication Sig   ? ASPIRIN LOW DOSE 81 MG EC tablet TAKE 1 TABLET BY MOUTH EVERY DAY   ? benzoyl peroxide-erythromycin 5-3% gel Apply to acne once daily..   ? Calcium Carb-Cholecalciferol (OYSTER SHELL CALCIUM W/D) 500-200 MG-UNIT TABS    ? clindamycin 300 mg capsule    ? DILANTIN 100 MG ER capsule TAKE 4 CAPSULES ORALLY AT BEDTIME   ? RETIN-A MICRO PUMP 0.04 % gel Apply topically at bedtime.   ? sulfacetamide-sulfur 9-4.5 % LIQD liquid Apply to face twice daily.   ? Tretinoin Microsphere (RETIN-A MICRO PUMP) 0.06 % GEL APPLY PEA SIZED AMOUNT TOPICALLY TO THE FACE NIGHTLY.       Physical Exam  Vitals:    08/05/21 0957   BP: 156/89   Pulse: 80   Temp: 36.6 ?C (97.9 ?F)   TempSrc: Tympanic   SpO2: 98%   Weight: 222 lb (100.7 kg)   Height: 6' (1.829 m)     Wt Readings from Last 3 Encounters:   08/05/21 222 lb (100.7 kg)   07/01/21 (!) 223 lb 12.8 oz (101.5 kg)   05/30/21 (!) 227 lb (103 kg)     Body mass index is 30.11 kg/m?Marland Kitchen  System Check if normal Positive or additional negative findings   Constit  [x]  General appearance     Eyes  []  Conj/Lids []  Pupils  []  Fundi     HENMT  []  External ears/nose []  TMs (bilateral)   []  Gross Hearing []  Nasal mucosa   []  Lips/teeth/gums []  Oropharynx    []  Mucus membranes []  Head     Neck  []  Inspection/palpation []  Thyroid     Resp  [x]  Effort []  No wheezing    []  Auscultation  []  No crackles     CV  []  Rhythm/rate []  Murmurs []  LEE    []  JVP non-elevated []  Radial pulses     Breast  []  Inspection []  Palpation     GI  []  No abd masses  []  No tenderness   []   No rebound/guarding  []  Liver/spleen      GU  []  Scrotum []  Penis []  Prostate      Lymph  Normal: []  Neck []  Axillae []  Groin     MSK site examined:    []  Inspect/palp []  ROM []  Strength/tone   nontender about 6cm mobile soft mass on dorsum of R foot     Skin  []  Inspection []  Palpation  Site:     Neuro  []  CN2-12 intact grossly   []  Alert and oriented   []  Muscle strength - legs     []  Sensation  []  Gait/balance     Psych  []  Insight/judgement  []  Mood/affect        I have:   [x]  Reviewed/ordered []  1 []  2 [x]  ? 3 unique laboratory, radiology, and/or diagnostic tests noted below       Lab Studies:  Lab Results   Component Value Date    NA 139 11/22/2020    K 4.5 11/22/2020    CL 102 11/22/2020    CO2 22 11/22/2020    BUN 9 11/22/2020    CREAT 0.46 (L) 11/22/2020    GLUCOSE 105 (H) 11/22/2020    CALCIUM 9.2 11/22/2020    ALT 18 11/22/2020    AST 22 11/22/2020    ALKPHOS 97 11/22/2020    BILITOT 0.2 11/22/2020     Lab Results   Component Value Date    WBC 5.45 11/22/2020    HGB 14.4 11/22/2020    HCT 43.6 11/22/2020    MCV 101.4 (H) 11/22/2020    PLT 211 11/22/2020     Lab Results   Component Value Date    HGBA1C 6.0 (H) 11/22/2020     Lab Results   Component Value Date    TSH 1.5 01/21/2017     Sept 2022:   FOOT, RIGHT (FINE NEEDLE ASPIRATION):   - Scattered inflammatory cells    Imaging Studies:     MRI L foot 06/24/2021  IMPRESSION:   1.  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  2.  Tenosynovitis of the extensor digitorum longus tendons.   3.  Nonspecific muscle edema within the plantar aspect of the forefoot.    [place] foot -  11/21/2020   IMPRESSION:?  Complex hypoechoic fluid collection measuring 5.4 cm corresponding to the area of concern in the anterior right foot, may relate to an evolving hematoma or other complex collection; superinfection not excluded. Correlate with visual inspection and for   prior history of trauma. If clinically indicated, aspiration could be considered.    [place] foot  -  03/21/2021  IMPRESSION:   1.  No discrete soft tissue mass at the plantar aspect of the left foot.  2.  Complex fluid collection within the dorsal tissues of the right foot, possibly representing a chronic seroma, hematoma or a posttraumatic adventitial bursa. In review of the electronic medical record, this has previously been aspirated.  Signed by: [place] S Gross   03/21/2021 2:02 PM     Assessment and Plan       ICD-10-CM    1. 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 Needs flu shot  Z23 Influenza vaccine IM;   PF   3. Plantar wart of left foot  B07.0            Problem   Seroma Due to Trauma (Hcc/Raf)    Since 2021, multiple drainage and recurrence, seen by Podiatry,  two [place] evaluations, fluids analysis showed  scattered inflammatory cells  08/05/2021 - drained again today, will get MRI for full eval and consideration of surgery with Podiatry     Plantar Wart of Left Foot    Two on plantar aspect at base of 2nd and 3rd toes.  Multiple cryotherapy since Oct 2018  07/08/2018- mild, cryo again today  08/05/2018 - normal appearing plantar surface on exam today, counseled patient that cryotherapy is not needed today  09/08/2018 - resolved  01/06/2019 - recurred, cryotherapy again today, f/u prn 1 month  02/04/2021 - very mild, appears to be more of a callus than a plantar wart, may also has a lesion deep to the plantar surface that is not apparent on exam, will check xray and refer to podiatry for eval.  No significant lesion requiring cryotherapy or excision today  04/04/2021 - patient describes symptoms in that area but no lesion noted, will complete MRI  08/05/2021 - recently seen by podiatry, MRI was reasuring         PROCEDURE NOTE:?Seroma drainage  Performed by:?Lendon Collar Roniel Halloran  Indication:?recurrent?seroma  Area cleaned thoroughly with alcohol swab. ?25?gauge needle used to administer 0.68ml of Xilocaine 2% for pain control, then 18 gauge needle used to?to drain seroma on the upper R foot. ?Return was?6.5?ml of clear serous?fluid. A dressing was placed over the site?and wrapped. The patient tolerated the procedure well. Wound care instructions were given.   Post-Procedure Diagnosis: same as indication  Complications: none  Estimated Blood Loss:?trace  ?      The above recommendation were discussed with the patient.  The patient has all questions answered satisfactorily and is in agreement with this recommended plan of care.    Return in about 4 weeks (around 09/02/2021) for chronic conditions follow up.     Author:  Lendon Collar. Montanna Mcbain 08/05/2021 1:16 PM  Time of note filed does not necessarily reflect the time of encounter.  Portions of this note may have been created with voice recognition software. Occasional wrong-word or ''sound-alike'' substitutions may have occurred due to the inherent limitations of voice recognition software. Please read the chart carefully and recognize, using context, where these substitutions have occurred.  16109 - I spent 30-39 minutes (established patient). When time (rather than medical decision making) is used for determination of Level of Service for this encounter, the time includes any time spent reviewing the chart on the day of the visit, time spent face to face with the patient, time spent documenting the visit on the day of the visit, and may include counseling and coordination of care also performed on the day of the visit.

## 2021-08-15 ENCOUNTER — Ambulatory Visit: Payer: MEDICAID

## 2021-08-15 NOTE — Treatment Summary
OCCUPATIONAL THERAPY TREATMENT NOTE       PATIENT: Dustin Watts  GENDER: male  AGE: 65 y.o.  DOB: April 18, 1956  MRN: [id number]    Diagnosis:     ICD-10-CM    1. Closed dislocation of interphalangeal joint of finger of left hand, initial encounter  S63.279A      Medicare:    Visit number: 23 (next report due visit 30)    Plan of Care Expires: 08/29/2021  Authorization expires:  Medical necessity     Precautions: none    Pain:Yes   Pain In: 5/10  Pain Out: 4/10  Location: left 4th digit   Duration: Intermittent  Description: Aching      SUBJECTIVE: Stiffness of digit comes and goes. Pain at night time, but usually ok during the day.       OBJECTIVE:     -update left 4th digit range of motion and grip strength   -paraffin to left hand 5 minutes   -dorsal gliding of left 4th digit proximal interphalangeal joint with extension stretch  -proximal interphalangeal extension stretch with green putty   -grip strengthening with yellow putty and hand gripper with yellow rubber band   -isolated finger strengthening in flexion with yellow digiflex (therapist calling out various number sequences that correlate with finger that should be flexed (digits 2-5))  light therapy intensity 3 x 3 areas (proximal interphalangeal joint of left 4th digit volar/dorsal) x 30 seconds for 2 minutes             Ring finger active range of motion 08/15/21  Metacarpal phalangeal (MCP) joint extension/flexion Right 66   Proximal interphalangeal (PIP) joint extension/flexion Right -12 (volar)/94     Distal interphalangeal (DIP) joint extension/flexion Right 56     GRIP (lbs) 2nd rung position 08/15/21  Right: Not assessed    Left: 46.5    ? Access Code: [email or web address]  ?   ? Exercises  ? Seated Finger DIP Flexion AROM with Blocking - 2-3 x daily - 7 x weekly - 2 sets - 10 reps  ? Finger PIP Flexion Extension with Blocking - 2-3 x daily - 7 x weekly - 2 sets - 10 reps  ? Seated Finger Composite Flexion Extension - 2-3 x daily - 7 x weekly - 2 sets - 10 reps  ? Seated Finger Composite Flexion Stretch - 2-3 x daily - 7 x weekly - 2 sets - 10 reps  ? Putty Squeezes - 1 x daily - 2-3 x weekly - 2 sets - 10 reps  -active hook fist while grasping highlighter      Patient Education:   Education Content:   see above, discomfort but not pain expected with exercises, nighttime orthosis , desensitization, digi-sleeve, bending every other finger, extension stretching   Education Delivery Method:   Verbal and demonstration   Education Response:   verbalizes understanding and demonstrates understanding      ASSESSMENT: LMB orthosis works more effectively after modifications, per patient. Some stiffness in extension of involved joint, but improved flexion. Increased left grip strength since last measurement. Patient would benefit from 2 more occupational therapy sessions to continue stretching, strengthening and work on fine motor tasks.       PLAN: strengthening excerises for 4th digit, fine motor tasks, continue lumbrical strengthening, grip strengthening as tolerated, continue to facilitate proximal interphalangeal extension and distal interphalangeal/ proximal interphalangeal flexion, continue with efforts to regain motion    Patient was seen for a total of 30 minutes  of treatment time. 23 minutes was in services represented by timed CPT codes.    Emmanuella Mirante J Raphael Fitzpatrick, OT/L

## 2021-08-29 ENCOUNTER — Ambulatory Visit: Payer: BLUE CROSS/BLUE SHIELD

## 2021-08-29 NOTE — Treatment Summary
OCCUPATIONAL THERAPY TREATMENT NOTE       PATIENT: Dustin Watts  GENDER: male  AGE: 65 y.o.  DOB: 07/25/1956  MRN: [id number]    Diagnosis:     ICD-10-CM    1. Closed dislocation of interphalangeal joint of finger of left hand, initial encounter  S63.279A      Medicare:    Visit number: 24 (next report due visit 30)    Plan of Care Expires: 08/29/2021  Authorization expires:  Medical necessity     Precautions: none    Pain: No       SUBJECTIVE: Slight discomfort when gripping flexbar. LMB orthosis is comfortable.       OBJECTIVE:       -instrument assisted soft tissue mobilization of dorsal/volar aspects of left 4th proximal interphalangeal joint  -dorsal gliding of left 4th digit proximal interphalangeal joint with extension stretch  -paraffin to left hand 5 minutes   -composite flexion stretch of 4th digit   -grip strengthening with yellow powerweb  -red flexbar bend in quadrants and twist for grip/wrist (flexion/extension/deviaiton) and forearm (supination/pronation) strengthening       Ring finger active range of motion 08/15/21  Metacarpal phalangeal (MCP) joint extension/flexion Right 66   Proximal interphalangeal (PIP) joint extension/flexion Right -12 (volar)/94     Distal interphalangeal (DIP) joint extension/flexion Right 56     GRIP (lbs) 2nd rung position 08/15/21  Right: Not assessed    Left: 46.5    ? Access Code: [email or web address]  ?   ? Exercises  ? Seated Finger DIP Flexion AROM with Blocking - 2-3 x daily - 7 x weekly - 2 sets - 10 reps  ? Finger PIP Flexion Extension with Blocking - 2-3 x daily - 7 x weekly - 2 sets - 10 reps  ? Seated Finger Composite Flexion Extension - 2-3 x daily - 7 x weekly - 2 sets - 10 reps  ? Seated Finger Composite Flexion Stretch - 2-3 x daily - 7 x weekly - 2 sets - 10 reps  ? Putty Squeezes - 1 x daily - 2-3 x weekly - 2 sets - 10 reps  -active hook fist while grasping highlighter      Patient Education:   Education Content:   see above, discomfort but not pain expected with exercises, nighttime orthosis , desensitization, digi-sleeve, bending every other finger, extension stretching   Education Delivery Method:   Verbal and demonstration   Education Response:   verbalizes understanding and demonstrates understanding      ASSESSMENT:  Slight stiffness in extension of involved joint, but composite flexion is nearly within normal limits. Overall tolerated therapeutic interventions well and activity tolerance has improved notably. Patient would benefit from one more occupational therapy sessions to continue stretching, strengthening and solidifying home exercise program.       PLAN: strengthening excerises for 4th digit, fine motor tasks, continue efforts to gain extension of proximal interphalangeal joint, review home exercise program, prepare for soft discharge    Patient was seen for a total of 30 minutes of treatment time. 25 minutes was in services represented by timed CPT codes.    Sanel Stemmer J Phylisha Dix, OT/L

## 2021-09-10 ENCOUNTER — Non-Acute Institutional Stay: Payer: BLUE CROSS/BLUE SHIELD

## 2021-09-26 ENCOUNTER — Inpatient Hospital Stay: Payer: BLUE CROSS/BLUE SHIELD

## 2021-09-26 DIAGNOSIS — M79672 Pain in left foot: Secondary | ICD-10-CM

## 2021-09-26 DIAGNOSIS — T792XXA Traumatic secondary and recurrent hemorrhage and seroma, initial encounter: Secondary | ICD-10-CM

## 2021-09-26 DIAGNOSIS — G40909 Epilepsy, unspecified, not intractable, without status epilepticus: Secondary | ICD-10-CM

## 2021-09-26 DIAGNOSIS — M7989 Other specified soft tissue disorders: Secondary | ICD-10-CM

## 2021-09-26 DIAGNOSIS — M79671 Pain in right foot: Secondary | ICD-10-CM

## 2021-09-26 DIAGNOSIS — M7752 Other enthesopathy of left foot: Secondary | ICD-10-CM

## 2021-09-30 ENCOUNTER — Ambulatory Visit: Payer: BLUE CROSS/BLUE SHIELD

## 2021-09-30 DIAGNOSIS — Z1159 Encounter for screening for other viral diseases: Secondary | ICD-10-CM

## 2021-09-30 DIAGNOSIS — K429 Umbilical hernia without obstruction or gangrene: Secondary | ICD-10-CM

## 2021-09-30 DIAGNOSIS — G40909 Epilepsy, unspecified, not intractable, without status epilepticus: Secondary | ICD-10-CM

## 2021-09-30 DIAGNOSIS — R718 Other abnormality of red blood cells: Secondary | ICD-10-CM

## 2021-09-30 DIAGNOSIS — Z23 Encounter for immunization: Secondary | ICD-10-CM

## 2021-09-30 LAB — CBC: NUCLEATED RBC%, AUTOMATED: 0 (ref 26.4–33.4)

## 2021-09-30 LAB — Vitamin D,25-Hydroxy: VITAMIN D,25-HYDROXY: 37 ng/mL (ref 20–50)

## 2021-09-30 LAB — Lipid Panel: TRIGLYCERIDES: 193 mg/dL — ABNORMAL HIGH (ref >40–<150)

## 2021-09-30 MED ORDER — ZOSTER VAC RECOMB ADJUVANTED 50 MCG/0.5ML IM SUSR
.5 mL | Freq: Once | INTRAMUSCULAR | 1 refills | Status: AC
Start: 2021-09-30 — End: ?

## 2021-09-30 MED ORDER — ATORVASTATIN CALCIUM 10 MG PO TABS
10 mg | ORAL_TABLET | Freq: Every evening | ORAL | 3 refills | Status: AC
Start: 2021-09-30 — End: ?

## 2021-09-30 NOTE — Progress Notes
PATIENT: Dustin Watts  MRN: [id number]  DOB: 03-26-1956  DATE OF SERVICE: 09/30/2021    CHIEF COMPLAINT:   Chief Complaint   Patient presents with   ? Follow-up        History of Present Illness  Dustin Watts is a 65 y.o. male presents for:       Foot lesion  - likely seroma  - R foot  - chronic and recurrent  - requesting draining again  - seeing podiatry  - recently with MRI, see results below    Elevated BP  - not checking at home recently  - chronically elevated in the office    Umbilical hernia  - chronic  - mild pain  - planning for surgery next year      ROS  No vomiting, no diarrhea, no constipation      Objective (click to expand/collapse)     Past Medical History:   Diagnosis Date   ? Acne    ? Arthritis    ? Cerebral palsy (HCC/RAF)    ? Eczema    ? Hypertension    ? Impaired glucose tolerance 03/23/2016    HGBA1C of 5.7 on 03/19/2016    ? Low vitamin D level 05/07/2016    05/07/2016 - taking antiepiletpic which may exacerbate it, started on ergo 50000, will recheck level in about 2 months    ? Overweight    ? Seizure (HCC/RAF) 1973    chronically on Dilantin, no seizure in 25 years, followed by Neurologist (Dr. Wiliam Ke) at [facility]   ? Shortening of arm, congenital     right arm       Medications that the patient states to be currently taking   Medication Sig   ? ASPIRIN LOW DOSE 81 MG EC tablet TAKE 1 TABLET BY MOUTH EVERY DAY   ? benzoyl peroxide-erythromycin 5-3% gel Apply to acne once daily..   ? Calcium Carb-Cholecalciferol (OYSTER SHELL CALCIUM W/D) 500-200 MG-UNIT TABS    ? clindamycin 300 mg capsule    ? DILANTIN 100 MG ER capsule TAKE 4 CAPSULES ORALLY AT BEDTIME   ? RETIN-A MICRO PUMP 0.04 % gel Apply topically at bedtime.   ? sulfacetamide-sulfur 9-4.5 % LIQD liquid Apply to face twice daily.   ? Tretinoin Microsphere (RETIN-A MICRO PUMP) 0.06 % GEL APPLY PEA SIZED AMOUNT TOPICALLY TO THE FACE NIGHTLY.       Physical Exam  Vitals:    09/30/21 0947 09/30/21 0950   BP: 161/96 144/95   Pulse: 90    Resp: 18    Temp: 36.3 ?C (97.3 ?F)    TempSrc: Tympanic    SpO2: 96%    Weight: 222 lb 3.2 oz (100.8 kg)    Height: 6' (1.829 m)      Wt Readings from Last 3 Encounters:   09/30/21 222 lb 3.2 oz (100.8 kg)   08/05/21 222 lb (100.7 kg)   07/01/21 (!) 223 lb 12.8 oz (101.5 kg)     Body mass index is 30.14 kg/m?Marland Kitchen  System Check if normal Positive or additional negative findings   Constit  [x]  General appearance     Eyes  []  Conj/Lids []  Pupils  []  Fundi     HENMT  []  External ears/nose []  TMs (bilateral)   []  Gross Hearing []  Nasal mucosa   []  Lips/teeth/gums []  Oropharynx    []  Mucus membranes []  Head     Neck  []  Inspection/palpation []  Thyroid  Resp  [x]  Effort []  No wheezing    []  Auscultation  []  No crackles     CV  []  Rhythm/rate []  Murmurs []  LEE    []  JVP non-elevated []  Radial pulses     Breast  []  Inspection []  Palpation     GI  []  No abd masses  []  No tenderness   []  No rebound/guarding  []  Liver/spleen      GU  []  Scrotum []  Penis []  Prostate      Lymph  Normal: []  Neck []  Axillae []  Groin     MSK site examined:    []  Inspect/palp []  ROM []  Strength/tone        Skin  []  Inspection []  Palpation  Site:  soft mass on the upper R foot without tenderness   Neuro  []  CN2-12 intact grossly   []  Alert and oriented   []  Muscle strength - legs     []  Sensation  []  Gait/balance     Psych  []  Insight/judgement  []  Mood/affect        I have:   [x]  Reviewed/ordered []  1 []  2 [x]  ? 3 unique laboratory, radiology, and/or diagnostic tests noted below       Lab Studies:  Lab Results   Component Value Date    NA 139 11/22/2020    K 4.5 11/22/2020    CL 102 11/22/2020    CO2 22 11/22/2020    BUN 9 11/22/2020    CREAT 0.46 (L) 11/22/2020    GLUCOSE 105 (H) 11/22/2020    CALCIUM 9.2 11/22/2020    ALT 18 11/22/2020    AST 22 11/22/2020    ALKPHOS 97 11/22/2020    BILITOT 0.2 11/22/2020     Lab Results   Component Value Date    WBC 5.45 11/22/2020    HGB 14.4 11/22/2020    HCT 43.6 11/22/2020    MCV 101.4 (H) 11/22/2020    PLT 211 11/22/2020     Lab Results   Component Value Date    HGBA1C 6.0 (H) 11/22/2020     Lab Results   Component Value Date    TSH 1.5 01/21/2017     Lab Results   Component Value Date    CHOL 279 11/22/2020    CHOLHDL 59 11/22/2020    CHOLDLCAL 174 (H) 11/22/2020    TRIGLY 231 (H) 11/22/2020     No results found for: FERRITIN  No results found for: VITAMINB12  No results found for: FOLATE    Lab Results   Component Value Date    VITD25OH 31 11/22/2020         Imaging Studies:   MR foot wo contrast right    Result Date: 09/26/2021  MR FOOT WO CONTRAST RIGHT  INDICATION:  dorsal chronic seroma assess for underlying injury midfoot  TECHNIQUE:  Sagittal T1, STIR, axial and coronal T2 fat-sat, axial T1 sequences of the right foot were obtained on a 3.0 Tesla MRI scanner.  CONTRAST: None.  COMPARISON: Radiograph 02/12/2021, [place] 03/21/2021  FINDINGS: Subjacent to the marker of interest at the dorsal midfoot soft tissues there is a multiloculated fluid collection measuring 3.2 cm long, 3.7 cm wide and 0.8 cm deep. There is a small amount of focal fluid along the more proximal dorsal foot adjacent the extensor tendons. There is no acute fracture, bone contusion, or bone stress injury. The marrow signal is unremarkable. There are healed fractures of the 5th and possibly 4th metatarsals. Mild scattered  interphalangeal degenerative changes. There are no joint effusions. There are no bony erosions. The hallux sesamoid bones are normal. The plantar plates are intact. The flexor and extensor tendons are intact. The Lisfranc ligament complex is intact. The plantar aponeurosis is intact. There is mild diffuse T2 hyperintensity and volume loss of the intrinsic foot musculature. Mild dorsal soft tissue edema along the base of the 2nd, 3rd, and 5th digits, non-specific may be pressure or friction related changes. Small rounded hypointensity in the subcutaneous tissues plantar to the fourth MTP joint likely focal fibrosis.      IMPRESSION: 1. Fluid collection overlying the dorsal midfoot, query chronic friction-related changes such as subcutaneous seroma or adventitial bursitis. Mild soft tissue edema along the dorsal base of digits, could also be chronic friction related changes. 2. Diffuse fatty atrophy and T2 hyperintensity within the intrinsic foot musculature, suggestive of chronic denervation. Lonia Chimera, M.D., have reviewed this radiological study personally and I am in full agreement with the findings of the report presented above. Dictated by: Laural Benes   09/26/2021 6:32 PM Signed by: Pincus Badder   09/26/2021 8:49 PM         Assessment and Plan       ICD-10-CM    1. 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)  T79.2XXA       2. Nonintractable epilepsy without status epilepticus, unspecified epilepsy type (HCC/RAF)  G40.909 Vitamin D,25-Hydroxy     Vitamin D,25-Hydroxy      3. Elevated cholesterol with elevated triglycerides  E78.2 atorvastatin 10 mg tablet     Lipid Panel     Lipid Panel      4. Low vitamin D level  R79.89 Vitamin D,25-Hydroxy     Vitamin D,25-Hydroxy      5. Umbilical hernia without obstruction and without gangrene  K42.9       6. White coat syndrome without hypertension  R03.0       7. Need for hepatitis B screening test  Z11.59 HBS Antigen     HBS Antigen      8. Prediabetes  R73.02 Hgb A1c     Hgb A1c      9. Elevated MCV  R71.8 Folate,Serum     Vitamin B12     CBC     CBC     Vitamin B12     Folate,Serum      10. Need for shingles vaccine  Z23 zoster vac recomb adjuvanted [facility]) 50 mcg injection              Problem   White Coat Syndrome Without Hypertension    HTN diagnosis in the past but likely was only white coat HTN as home readings consistently reassuring and home cuff has correlated with office measurement on 12/02/2017, lip swelling with carvedilol and amlodipine, rash with losartan, sulfa allergy with HCTZ.  09/08/2018 - again home records reassuring with elevation in the office.  Good correlation between measurements on home BP cuff today and our automated cuff.  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 Mild soft tissue edema along the dorsal base of digits, could also be chronic friction related changes.''  Will f/u with Podiatry for definitive management     Umbilical Hernia    05/27/2017 - reducible  01/03/2021 - eval wit surgery last month: ''patient has elected to continue non-operative management since his symptoms from the hernia are minimal at this point. We gave return precautions and patient will schedule follow up with [place] in 6 months to 1 year to revisit discussion for surgery''  09/30/2021 - stable but persisting symptoms, planning for surgery next year     Elevated Cholesterol With Elevated Triglycerides    01/21/2017 - ASCVD score about 10%, patient declines medicine, counseled on lifestyle interventions  09/29/2018 - briefly on Lipitor but stopped (headaches, bloating)   01/03/2021 - ASCVD 15.8%  09/30/2021 - again advised statin, will start Lipitor 10, counseled on side effects     Low Vitamin D Level    taking antiepiletpic which may decrease vit D level  03/04/2018- adequate recent level, can change from weekly to daily supplementation  09/30/2021 - recheck today     Epilepsy (Hcc/Raf)    chronically on Dilantin, no seizure in 26 years, followed by Neurologist (Dr. Wiliam Ke) at [facility]  01/10/2018 - well controlled, no change in Dilantin, continue management with neuro, recheck vit D today  09/30/2021 - stable on Dilantin, check vit D today             The above recommendation were discussed with the patient.  The patient has all questions answered satisfactorily and is in agreement with this recommended plan of care.    No follow-ups on file.     Author:  Lendon Collar. Maddyson Keil 09/30/2021 10:27 AM  Time of note filed does not necessarily reflect the time of encounter.  Portions of this note may have been created with voice recognition software. Occasional wrong-word or ''sound-alike'' substitutions may have occurred due to the inherent limitations of voice recognition software. Please read the chart carefully and recognize, using context, where these substitutions have occurred.  [id number] - I spent 40-54 minutes (established patient). When time (rather than medical decision making) is used for determination of Level of Service for this encounter, the time includes any time spent reviewing the chart on the day of the visit, time spent face to face with the patient, time spent documenting the visit on the day of the visit, and may include counseling and coordination of care also performed on the day of the visit.

## 2021-09-30 NOTE — Patient Instructions
Please complete the Shingrix (shingles vaccine) at the pharmacy.

## 2021-10-01 LAB — Hgb A1c: HGB A1C - HPLC: 5.8 — ABNORMAL HIGH (ref <5.7)

## 2021-10-01 LAB — HBs Ag: HEPATITIS B SURFACE ANTIGEN: NONREACTIVE

## 2021-10-01 LAB — Vitamin B12: VITAMIN B12: 2912 pg/mL — ABNORMAL HIGH (ref 254–1060)

## 2021-10-01 LAB — Folate,Serum: FOLATE,SERUM: 30.9 ng/mL — ABNORMAL HIGH (ref 8.1–30.4)

## 2021-10-09 ENCOUNTER — Ambulatory Visit: Payer: MEDICAID

## 2021-10-09 NOTE — Treatment Summary
OCCUPATIONAL THERAPY DISCHARGE NOTE AND TREATMENT NOTE   Reporting period: 05/28/21 - 10/09/21      PATIENT: Dustin Watts  GENDER: male  AGE: 65 y.o.  DOB: [birth year]  MRN: [id number]    Diagnosis:     ICD-10-CM    1. Closed dislocation of interphalangeal joint of finger of left hand, initial encounter  S63.279A         Medicare:    Visit number: 25 (next report due visit 30)    Plan of Care Expires: 11/06/2021  Authorization expires:  Medical necessity     Precautions: none    Pain: No (however, at night, pain can increase to 6/10 at worst)      SUBJECTIVE: Patient brings in LMB orthosis to have it repaired as foam padding is coming apart.       OBJECTIVE:     -reassessed finger range of motion and grip strength  -paraffin to left hand 5 minutes  -dorsal gliding of left 4th digit proximal interphalangeal joint with extension stretch  -composite flexion stretch of 4th digit   -modification to LMB orthosis: foam padding readjusted and kinesiotape used to reinforce all areas that are not secure. Patient verbalized satisfaction with fit of orthosis and was reminded to monitor for sufficient blood flow and to ensure it is not too tight. Patient reminded to avoid wearing this while sleeping.       Ring finger active range of motion 10/09/21  Metacarpal phalangeal (MCP) joint extension/flexion Right 81   Proximal interphalangeal (PIP) joint extension/flexion Right -12 (volar)/90     Distal interphalangeal (DIP) joint extension/flexion Right 54     GRIP (lbs) 2nd rung position 10/09/21  Right: Not assessed    Left: 57, 60.5         Home exercise program:   Access Code: [email or web address]  ?   ? Exercises  ? Seated Finger DIP Flexion AROM with Blocking - 2-3 x daily - 7 x weekly - 2 sets - 10 reps  ? Finger PIP Flexion Extension with Blocking - 2-3 x daily - 7 x weekly - 2 sets - 10 reps  ? Seated Finger Composite Flexion Extension - 2-3 x daily - 7 x weekly - 2 sets - 10 reps  ? Seated Finger Composite Flexion Stretch - 2-3 x daily - 7 x weekly - 2 sets - 10 reps  ? Putty Squeezes - 1 x daily - 2-3 x weekly - 2 sets - 10 reps  -active hook fist while grasping highlighter      Patient Education:   Education Content:   see above, discomfort but not pain expected with exercises, nighttime orthosis , desensitization, digi-sleeve, bending every other finger, extension stretching   Education Delivery Method:   Verbal and demonstration   Education Response:   verbalizes understanding and demonstrates understanding      ASSESSMENT:  Slight stiffness in extension of involved joint, but composite flexion is nearly within normal limits. Overall tolerated therapeutic interventions well and activity tolerance has improved notably. Patient would benefit from one more occupational therapy sessions to continue stretching, strengthening and solidifying home exercise program.       PLAN: strengthening excerises for 4th digit, fine motor tasks, continue efforts to gain extension of proximal interphalangeal joint, review home exercise program, prepare for soft discharge    Patient was seen for a total of 30 minutes of treatment time. 25 minutes was in services represented by timed CPT codes.    Joncarlo Friberg J  Eleanor Dimichele, OT/L    Plan of Care:   ?  Problem List:  Decreased range of motion,Increased edema and Increased pain, Limitations in ability to handle groceries?    Goals:  Short Term Goal:  Patient will learn and implement at least three techniques to decrease finger swelling for ease in grasping objects in 2 sessions.?Met?  Patient will increase left 4th metacarpophalangeal joint active flexion by at least 30 degrees for ease in performing dressing tasks in 6 weeks.?Met  Patient will sleep throughout night with no more than 1-2/10 pain in 6 weeks.?Not met  ?  Long Term Goal:  Patient will?demonstrate at least 20-25 pounds of?grip strength for ease in opening jars/bottles in 12?weeks.?Met?  Patient will perform all self care with modified independence - independence in 12 weeks.?Met  Patient will sleep sans finger pain in 12 weeks.?Not met   ?  ?  ASSESSMENT:  65 year old?male?presents with referral diagnosis of Closed dislocation of interphalangeal joint of finger of left hand, initial encounter. Avulsion fracture of middle phalanx of finger, closed, initial encounter?since 08/15/20.  ?  Patient has met 4/6 short and long term goals. Unmet goals are relate to intermittent night pain in involved digit. No pain during the day and patient able to perform self care and complete  household chores/cooking without limitation for the most part. Range of motion of digit in flexion and grip strength have improved since last measurement. Patient will be discharged after session today (10/09/21) due to patient achieving maximum return from occupational therapy intervention and patient having solid home exercise program. Patient satisfied with LMB orthosis fit after modifications today and reports this helps to decrease pain when it comes. Symptoms are in the?chronic?phase with?mild to?moderate?irritability.    ?  The patient requires skilled Occupational Therapy for one more session (today, 10/09/21) that can be safely and effective performed only by a qualified therapist to ensure patient is equipped with intact extension orthosis and home exercise program.    ?  Fayola Meckes J Ruthmary Occhipinti, OT  ?  ?  ?  ?  ?  ?  REHAB POTENTIAL:?Good  ?  OBSTACLES TO REHABILITATION/CO-MORBIDITIES THAT MAY AFFECT GOALS AND TREATMENT PLAN:?Functional limitations of right upper extremity due to cerebral palsy  ?  Plan:   INTERVENTIONS:?  Therapeutic Exercise, Manual Therapy, Neuromuscular Re-education, Therapeutic Activities, Ultrasound, Traction, Electrical Stimulation, Iontophoresis, Paraffin, Heat Therapy, Cold Therapy, Patient/Caregiver Education, Self Care Training and Light Therapy, [id number], [id number], [phone]  ?  Patient to be seen?1?times per week for 4?week(s).  ?  Patient agrees with goals and treatment plan as outlined above:Yes  ?

## 2021-10-22 ENCOUNTER — Ambulatory Visit: Payer: BLUE CROSS/BLUE SHIELD

## 2021-10-22 DIAGNOSIS — M7989 Other specified soft tissue disorders: Secondary | ICD-10-CM

## 2021-10-22 DIAGNOSIS — M7752 Other enthesopathy of left foot: Secondary | ICD-10-CM

## 2021-10-22 DIAGNOSIS — M79671 Pain in right foot: Secondary | ICD-10-CM

## 2021-10-22 DIAGNOSIS — M79672 Pain in left foot: Secondary | ICD-10-CM

## 2021-10-22 NOTE — Progress Notes
PATIENT:Dustin Watts  JJO:[id number]  DOB:1956/04/09  DATE OF SERVICE: 10/22/2021     Referring physician: Murtis Sink., MD, MPH    CHIEF COMPLAINT   Mass on the the right foot    HISTORY OF PRESENT ILLNESS   Interval history 10/22/21: followup  B/l foot pain. Patient recently had drainage of right foot seroma by PCP,Samras, Lendon Collar., MD, MPH but does continue to have mass and pain. Patient also complains of left plantar forefoot pain. Patient would like to review MRI results.    07/30/21: followup  B/l foot pain. Right foot seroma has returned and patient continues to have pain to left plantar forefoot. Patient would like to review MRI results left foot.    04/21/21: followup right foot mass s/p needle aspiration on 03/26/21. Seroma has started to gradually come back and cause some discomfort.    03/26/21: followup  Right foot mass. Patient continues to have discomfort to the foot. Patient would like to review US findings.    The patient is a very pleasant 65 y.o. male who complains of a mass to the dorsal  right foot which is not painful.  It bothers the patient in closed shoes and has been   present for few years.   No history of trauma to the foot.  Prior treatment for this mass include: prior needle aspiration right foot x2  Patient does admit to trauma a few years ago when he first noticed the mass    PAST MEDICAL HISTORY     Past Medical History:   Diagnosis Date   ? Acne    ? Arthritis    ? Cerebral palsy (HCC/RAF)    ? Eczema    ? Hypertension    ? Impaired glucose tolerance 03/23/2016    HGBA1C of 5.7 on 03/19/2016    ? Low vitamin D level 05/07/2016    05/07/2016 - taking antiepiletpic which may exacerbate it, started on ergo 50000, will recheck level in about 2 months    ? Overweight    ? Seizure (HCC/RAF) 1973    chronically on Dilantin, no seizure in 25 years, followed by Neurologist (Dr. Wiliam Ke) at [facility]   ? Shortening of arm, congenital     right arm       MEDICATIONS     Outpatient Medications Prior to Visit   Medication Sig   ? ASPIRIN LOW DOSE 81 MG EC tablet TAKE 1 TABLET BY MOUTH EVERY DAY   ? atorvastatin 10 mg tablet Take 1 tablet (10 mg total) by mouth at bedtime.   ? benzoyl peroxide-erythromycin 5-3% gel Apply to acne once daily..   ? Calcium Carb-Cholecalciferol (OYSTER SHELL CALCIUM W/D) 500-200 MG-UNIT TABS    ? clindamycin 300 mg capsule    ? DILANTIN 100 MG ER capsule TAKE 4 CAPSULES ORALLY AT BEDTIME   ? ibuprofen 600 mg tablet Take 1 tablet (600 mg total) by mouth daily as needed. (Patient not taking: No sig reported)   ? RETIN-A MICRO PUMP 0.04 % gel Apply topically at bedtime.   ? sulfacetamide-sulfur 9-4.5 % LIQD liquid Apply to face twice daily.   ? Tretinoin Microsphere (RETIN-A MICRO PUMP) 0.06 % GEL APPLY PEA SIZED AMOUNT TOPICALLY TO THE FACE NIGHTLY.     No facility-administered medications prior to visit.       ALLERGIES     Allergies   Allergen Reactions   ? Topiramate      Eye swelling   ? Sulfa  Antibiotics Angioedema     No anaphylaxis   ? Amlodipine Other (See Comments)     Lip swelling without anaphylaxis   ? Carvedilol Angioedema     Lip swelling without anaphylaxis   ? Losartan Rash     No hive, no anaphylaxis, unclear if truly has rash from this medicine   ? Penicillins Rash     ''bumps on body''       SURGICAL HISTORY     Past Surgical History:   Procedure Laterality Date   ? rectal abscess  2010    surgery done at [facility]         SOCIAL HISTORY     Social History     Socioeconomic History   ? Marital status: Married   ? Number of children: 0   Occupational History   ? 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 Smoking status: Never   ? Smokeless tobacco: Never   Vaping Use   ? Vaping Use: Never used   Substance and Sexual Activity   ? Alcohol use: No   ? Drug use: No   ? Sexual activity: Not Currently     Partners: Female     Comment: never married (incorrectly documented in Epic)   Other Topics Concern   ? Do you exercise at least a day, 3 or more days a week? No   ? Types of Exercise? (List in Comments) Yes     Comment: biking infrequently, abdominal exercise   ? Do you follow a special diet? No   ? Vegan? No   ? Vegetarian? No   ? Pescatarian? No   ? Lactose Free? No   ? Gluten Free? No   ? Omnivore? Yes       FAMILY HISTORY     Family History   Problem Relation Age of Onset   ? Heart disease Mother    ? Diabetes Mother    ? Kidney disease Mother    ? Stroke Brother    ? Colon cancer Neg Hx    ? Prostate cancer Neg Hx    ? Ovarian cancer Neg Hx    ? Breast cancer Neg Hx    ? Hyperlipidemia Neg Hx    ? Hypertension Neg Hx          PHYSICAL EXAM     GEN: No acute distress.  Demonstrates appropriate mood and affect.    The patient looks stated age and appears well nourished.    VASC:  Dorsalis Pedis:  Palpable 2/4 bilateral feet  Posterior Tibial:  Palpable 2/4 bilateral feet  There is brisk capillary refill time to all digits less than 3 seconds.   There are no varicosities or telangiectasias to the ankles  Edema is absent bilaterally  Ecchymosis is not observed    DERM:   Skin tone and turgor are within normal limits.  There is no visible erythema.      NEURO:  Light touch and protective threshold sensation with monofilament wire testing is grossly intact to both feet. No motor deficits noted with resisted range of motion to the hindfoot or toes.    MSK:    No gross musculoskeletal deformities noted  There is a sessile soft tissue mass that is not tender to palpation over the right dorsal foot  There is no change in pigment to the overlying skin.  No surrounding erythema, edema  or ecchymosis.    No pain with range of motion to any of the joints about the foot or ankle.  Tendernesstopalpation left sub 2nd metatarsal head    Hgb A1c - HPLC   Date Value Ref Range Status   09/30/2021 5.8 (H) <5.7 % Final     Comment:     For patients with diabetes, an A1c less than (<) or equal (=) to 7.0% is recommended for most patients, however the goal may be higher or lower depending on age and/or other medical problems.   For a diagnosis of diabetes, A1c greater than (>) or equal(=) to 6.5% indicates diabetes; values between 5.7% and 6.4% may indicate an increased risk of developing diabetes.   11/22/2020 6.0 (H) <5.7 % Final     Comment:     For patients with diabetes, an A1c less than (<) or equal (=) to 7.0% is recommended for most patients, however the goal may be higher or lower depending on age and/or other medical problems.   For a diagnosis of diabetes, A1c greater than (>) or equal(=) to 6.5% indicates diabetes; values between 5.7% and 6.4% may indicate an increased risk of developing diabetes.   04/14/2019 5.8 (H) <5.7 % Final     Comment:     For patients with diabetes, an A1c less than (<) or equal (=) to 7.0% is recommended for most patients, however the goal may be higher or lower depending on age and/or other medical problems.   For a diagnosis of diabetes, A1c greater than (>) or equal(=) to 6.5% indicates diabetes; values between 5.7% and 6.4% may indicate an increased risk of developing diabetes.        Hemoglobin   Date Value Ref Range Status   09/30/2021 14.5 13.5 - 17.1 g/dL Final   59/56/3875 64.3 13.5 - 17.1 g/dL Final   32/95/1884 16.6 13.5 - 17.1 g/dL Final     Hematocrit   Date Value Ref Range Status   09/30/2021 42.5 38.5 - 52.0 % Final   11/22/2020 43.6 38.5 - 52.0 % Final   07/27/2019 41.1 38.5 - 52.0 % Final     White Blood Cell Count   Date Value Ref Range Status   09/30/2021 5.11 4.16 - 9.95 x10E3/uL Final   11/22/2020 5.45 4.16 - 9.95 x10E3/uL Final   07/27/2019 5.46 4.16 - 9.95 x10E3/uL Final     Platelet Count, Auto   Date Value Ref Range Status   09/30/2021 207 143 - 398 x10E3/uL Final   11/22/2020 211 143 - 398 x10E3/uL Final   07/27/2019 173 143 - 398 x10E3/uL Final     Mean Corpuscular Hemoglobin   Date Value Ref Range Status   09/30/2021 34.2 (H) 26.4 - 33.4 pg Final   11/22/2020 33.5 (H) 26.4 - 33.4 pg Final   07/27/2019 33.2 26.4 - 33.4 pg Final     Mean Corpuscular Volume   Date Value Ref Range Status   09/30/2021 100.2 (H) 79.3 - 98.6 fL Final   11/22/2020 101.4 (H) 79.3 - 98.6 fL Final   07/27/2019 98.8 (H) 79.3 - 98.6 fL Final         IMAGING   09/26/21 right foot MRI    ?  IMPRESSION:   ?  1. Fluid collection overlying the dorsal midfoot, query chronic friction-related changes such as subcutaneous seroma or adventitial bursitis. Mild soft tissue edema along the dorsal base of digits, could also be chronic friction related changes.   2. Diffuse  fatty atrophy and T2 hyperintensity within the intrinsic foot musculature, suggestive of chronic denervation.   ?   06/20/21 left foot MRI  IMPRESSION:   1.  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  2.  Tenosynovitis of the extensor digitorum longus tendons.   3.  Nonspecific muscle edema within the plantar aspect of the forefoot.  ?  ?  Right and left foot [place]    IMPRESSION:   ?  1.  No discrete soft tissue mass at the plantar aspect of the left foot.  ?  2.  Complex fluid collection within the dorsal tissues of the right foot, possibly representing a chronic seroma, hematoma or a posttraumatic adventitial bursa. In review of the electronic medical record, this has previously been aspirated.  ?  Signed by: [place]   ASSESSMENT/PLAN     Dustin Watts is a 65 y.o. male who presents with:    1. Soft tissue mass  Referral to Plastic and Reconstructive Surgery      2. Capsulitis of toe of left foot        3. Pain in both feet        4. Bursitis of left foot        5. 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)         discussion of findings  Review of MRI findings still unclear etiology of right foot seroma  Discussed possible surgical exploration of right dorsal foot mass including risks and benefits  Would recommend patient get 2nd opinion regarding foot mass  Referral to plastic surgery.  Left 2nd MPJ injecion 1cc 1:1 celestone, 1% lidocaine plain  Followup  PRN    Approximately 25 minutes was spent in reviewing the patients medical records and imaging studies, obtaining the patients medical history, performing the physical exam, discussing with the patient his condition, formulating a treatment plan, coordinating care, placing orders, and/or preparing the medical record note.  At least 50% of this time was spent in face to face interaction with the patient.      Sandre Kitty, DPM  10/22/2021

## 2021-11-18 ENCOUNTER — Telehealth: Payer: MEDICAID

## 2021-11-18 ENCOUNTER — Non-Acute Institutional Stay: Payer: MEDICAID

## 2021-11-18 NOTE — Telephone Encounter
Called pt left [facility] informing him ''Due to a combination of the rain and the [employer], there are many lane closures. Please expect to add at least 30 minutes to your commute. We will try to accommodate where possible, but please try to be on time. We can not guarantee we will be able to see you if you are prohibitively late.'' - per physicians and management.

## 2021-12-02 ENCOUNTER — Non-Acute Institutional Stay: Payer: MEDICAID

## 2021-12-10 ENCOUNTER — Non-Acute Institutional Stay: Payer: BLUE CROSS/BLUE SHIELD

## 2021-12-26 ENCOUNTER — Ambulatory Visit: Payer: BLUE CROSS/BLUE SHIELD

## 2021-12-26 DIAGNOSIS — I158 Other secondary hypertension: Secondary | ICD-10-CM

## 2021-12-26 DIAGNOSIS — G40909 Epilepsy, unspecified, not intractable, without status epilepticus: Secondary | ICD-10-CM

## 2021-12-26 DIAGNOSIS — K429 Umbilical hernia without obstruction or gangrene: Secondary | ICD-10-CM

## 2021-12-26 DIAGNOSIS — Z0000 Encounter for general adult medical examination without abnormal findings: Secondary | ICD-10-CM

## 2021-12-26 MED ORDER — ATORVASTATIN CALCIUM 10 MG PO TABS
10 mg | ORAL_TABLET | Freq: Every evening | ORAL | 3 refills | Status: AC
Start: 2021-12-26 — End: ?

## 2021-12-26 NOTE — Progress Notes
[facility] INTERNAL MEDICINE & PEDIATRICS  [facility]  [address]  [address]  [place] [address]  Phone: [phone]  FAX: [phone]  [phone]    Preventive Health Visit - Medicare Visit     Subjective:     CC:    Annual Exam    [ ]  [phone] - Initial preventive physical examination (IPPE) time examination performed within 12 months of the first effective date of Medicare Part B coverage (does not cover any clinical laboratory tests)  [ ]  G0403 - ECG, routine, screening for initial preventive physical examination with interpretation and report  [ ]  G0404 - ECG, routine, screening for initial preventive physical examination without interpretation and report  [ ]  G0438 - Medicare annual wellness (AWV) - initial annual wellness visit (outside of the 12 month window from start of coverage); includes a personalized prevention plan of service  [ x] [phone] - Medicare annual wellness (AWV) - follow up 12 months after the last Annual Wellness Exam    Patient Active Problem List   Diagnosis   ? Acne   ? Osteoarthritis of both knees   ? Overweight   ? White coat syndrome without hypertension   ? Epilepsy (HCC/RAF)   ? Prediabetes   ? Low vitamin D level   ? Elevated cholesterol with elevated triglycerides   ? Drug allergy   ? Umbilical hernia   ? Gall bladder stones   ? Plantar wart of left foot   ? Abnormal brain scan   ? Allergic conjunctivitis   ? Varicose veins of both lower extremities   ? Stress due to family tension   ? 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)   ? Closed dislocation of interphalangeal joint of finger of left hand, initial encounter   ? Elevated MCV       HPI:   Dustin Watts is a 66 y.o. male who presents for a preventive health exam.    Health Maintenance   Topic Date Due   ? Shingles (Shingrix) Vaccine (1 of 2) Never done   ? Colorectal Cancer Screening  05/27/2018   ? COVID-19 Vaccine(Tracks primary and booster doses, not sup/immunocomp) (3 - Booster for Pfizer series) 11/15/2020   ? Annual Preventive Wellness Visit  11/22/2021   ? Prediabetes Screening (See hover text)  09/30/2024   ? Tdap/Td Vaccine (2 - Td or Tdap) 03/08/2026   ? Hepatitis B Screening  Completed   ? Influenza Vaccine  Completed   ? Hepatitis C Screening  Completed   ? Statin prescribed for ASCVD Prevention or Treatment  Completed   ? HIV Screening  Addressed       Health Risk Assessment (HRA):    Self assessment of health status  Overall, ''okay''.  Multiple chronic issues as described below    Activities of Daily Living:   [x]  Independent in all ADLs and instrumental ADLs   []  Needs help in the following:   []  Bathing: hygiene/grooming  []  Dressing  []  Walking: mobility    []  Toileting; continence  []  Eating  []  Shopping    []  Housekeeping  []  Managing Meds  []  Handling Finance    []  Cooking  []  Using Telephone  []  Other     Hearing impairment  [x]  No issues   []  Discussed the following:     Fall risk  [x]  No issues   []  Falls in the past year?   []  Injury from fall  in the past year?  []  Feels unsteady when standing or walking?  []  Worried about falling?     Home Safety   [x]  No issues    []  Worried about clutter in your home?   []  Poor lighting by day or night in your home?   []  Slippery surfaces in your home?   []  Worried about falling when using your toilet, bath or shower facilities?    Goals of Care and End-of-Life Planning  []  On file   [x]  Discussed as follows: full code     Depression screen  PHQ2 [ ]  positive; [ x] negative    Current Providers and Suppliers  PCP:  Murtis Sink., MD, MPH  Podiatry  Neurology    Healthy habits  [ ]  Discussed sun screen use  [ x] Discussed diet and physical activities  [ x] Discussed dentist visit twice a year    Physical exam screening  [ ]  DRE and prostate exam discussed including the risks/benefits of this exam  [ ]  Breast exam discussed   [ ]  Pap +/- HPV testing discussed     Screening labs  [x ] PSA discussed and ordered including the risks/benefits of this testing  [ ]  Chlamydia testing discussed  [ ]  HIV testing discussed  [ ]  Hep C testing discussed  [ x] Lipids discussed  [ x] FIT discussed   [x ] Diabetes screening with A1C discussed     Screening imaging/procedures  [ ]  Dexa scan discussed   [ ]  Mammography discussed   [ x] Colonoscopy discussed   [ ]  AAA screening discussed  [ ]  Lung cancer screening discussed    Vaccines  If applicable, the vaccinations listed in the A/P section below were discussed and administered.      In addition to preventive health, the patient has the separately identifiable issues as shown below.    1. Abd pain  - started on christmas eve  - lasted 3 weeks  - chest cold and nasal dripping  - umbilical hernia increased in size  - severe abd pain and almost called EMS but did not go to the hospital  - also with vomiting and diarrhea  - thinks it was food he ate  - all symptoms improved except for a little cough        Review of Systems  On review of systems, all of the following were normal except for the bolded items which were abnormal:  Const - fevers, unusual fatigue  Eyes - vision change, eye irritation  ENT/mouth - rhinorrhea, sore throat  CV - CP, lower extremity edema  Resp - cough, SOB  GI - nausea, vomiting, abd pain, diarrhea (mostly resolved), constipation  GU - lesions, discharge  Musc - body aches, joint aches  Skin - lesions, rash  Neuro - weakness, numbness  Psych - depression, anxiety  Endo - weight change, hair loss  Hem/lymph - bleeding, LAD  Allerg/immun- allergies, unusually frequent infections      Past Medical/Surgical/Family/Social History  The following past medical history was reviewed with the patient.  He has a past medical history of Acne, Arthritis, Cerebral palsy (HCC/RAF), Eczema, Hypertension, Impaired glucose tolerance (03/23/2016), Low vitamin D level (05/07/2016), Overweight, Seizure (HCC/RAF) (1973), and Shortening of arm, congenital.    The following past surgical history was reviewed with the patient.  Past Surgical History:   Procedure Laterality Date   ? rectal abscess  2010    surgery done at [facility] the [place]       The following family history was reviewed with the patient.  Family History   Problem Relation Age of Onset   ? Heart disease Mother    ? Diabetes Mother    ? Kidney disease Mother    ? Stroke Brother    ? Colon cancer Neg Hx    ? Prostate cancer Neg Hx    ? Ovarian cancer Neg Hx    ? Breast cancer Neg Hx    ? Hyperlipidemia Neg Hx    ? Hypertension Neg Hx        The following social history was reviewed with the patient.  Social History     Socioeconomic History   ? Marital status: Married   ? Number of children: 0   Occupational History   ? 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 Smoking status: Never   ? Smokeless tobacco: Never   Vaping Use   ? Vaping Use: Never used   Substance and Sexual Activity   ? Alcohol use: No   ? Drug use: No   ? Sexual activity: Not Currently     Partners: Female     Comment: never married (incorrectly documented in Epic)   Other Topics Concern   ? Do you exercise at least a day, 3 or more days a week? No   ? Types of Exercise? (List in Comments) Yes     Comment: biking infrequently, abdominal exercise   ? Do you follow a special diet? No   ? Vegan? No   ? Vegetarian? No   ? Pescatarian? No   ? Lactose Free? No   ? Gluten Free? No   ? Omnivore? Yes       Medications/Supplements  The following medication list was reviewed with the patient.    Current Outpatient Medications:   ?  ASPIRIN LOW DOSE 81 MG EC tablet, TAKE 1 TABLET BY MOUTH EVERY DAY, Disp: 90 tablet, Rfl: 3  ?  benzoyl peroxide-erythromycin 5-3% gel, Apply to acne once daily.., Disp: 46.6 g, Rfl: 11  ?  Calcium Carb-Cholecalciferol (OYSTER SHELL CALCIUM W/D) 500-200 MG-UNIT TABS, , Disp: , Rfl:   ?  DILANTIN 100 MG ER capsule, TAKE 4 CAPSULES ORALLY AT BEDTIME, Disp: , Rfl:   ?  ibuprofen 600 mg tablet, Take 1 tablet (600 mg total) by mouth daily as needed., Disp: 30 tablet, Rfl: 1  ?  RETIN-A MICRO PUMP 0.04 % gel, Apply topically at bedtime., Disp: 50 g, Rfl: 11  ?  sulfacetamide-sulfur 9-4.5 % LIQD liquid, Apply to face twice daily., Disp: 454 g, Rfl: 11  ?  Tretinoin Microsphere (RETIN-A MICRO PUMP) 0.06 % GEL, APPLY PEA SIZED AMOUNT TOPICALLY TO THE FACE NIGHTLY., Disp: 50 g, Rfl: 11  ?  atorvastatin 10 mg tablet, Take 1 tablet (10 mg total) by mouth at bedtime., Disp: 90 tablet, Rfl: 3    Objective:   Physical Exam  BP 145/93  ~ Pulse (!) 101  ~ Temp 36.8 ?C (98.2 ?F) (Tympanic)  ~ SpO2 95%   Wt Readings from Last 3 Encounters:   09/30/21 222 lb 3.2 oz (100.8 kg)   08/05/21 222 lb (100.7 kg)   07/01/21 (!) 223 lb 12.8 oz (101.5 kg)     Visual acuity screen - normal on gross evaluation  Cognitive function screen from direct observation - no concerns during the interview  Constitutional - alert, no distress  Eyes - clear conjunctiva  Ears, Nose, Mouth and Throat - lips/mucosa/tongue normal, posterior oropharynx normal without exudate  Neck - supple with midline trachea, no thyromegaly, no lymphadenopathy  Respiratory - clear to auscultation bilaterally, respirations unlabored  Cardiovascular - regular rhythm, S1 and S2 normal, no murmur, no lower extremity edema, no carotid bruits b/l, 2+ radial pulses  Gastrointestinal - soft, nontender, no hepatomegaly, no splenomegaly, has 4.5cm reducible umbilical hernia without tenderness  MSK - malformation of R arm shown. 4th finger of the R hand with a brace  Skin - no rash or lesions, texture and turgor normal. Soft mass on the dorsum of the R foot  Neuro - normal strength throughout, normal speech, normal balance  Psychiatric - normal insight and judgement, normal mental status with adequate orientation      Labs  I personally reviewed the following labs  Lab Results   Component Value Date    WBC 5.11 09/30/2021    HGB 14.5 09/30/2021    HCT 42.5 09/30/2021    MCV 100.2 (H) 09/30/2021    PLT 207 09/30/2021     Lab Results   Component Value Date    NA 139 11/22/2020    K 4.5 11/22/2020    CL 102 11/22/2020    CO2 22 11/22/2020    BUN 9 11/22/2020    CREAT 0.46 (L) 11/22/2020    GLUCOSE 105 (H) 11/22/2020    CALCIUM 9.2 11/22/2020    ALT 18 11/22/2020    AST 22 11/22/2020    ALKPHOS 97 11/22/2020    BILITOT 0.2 11/22/2020     Lab Results   Component Value Date    HGBA1C 5.8 (H) 09/30/2021     Lab Results   Component Value Date    CHOL 258 09/30/2021    CHOLDLCAL 167 (H) 09/30/2021    TRIGLY 193 (H) 09/30/2021     Lab Results   Component Value Date    VITD25OH 37 09/30/2021       Studies  XR foot ap+lat+obl bilat (3 views ea)    Result Date: 02/13/2021  IMPRESSION: Nonspecific soft tissue tissue mass dorsally on the right foot. Healed fractures of the right fourth and fifth metatarsals. Mild hallux MTP osteoarthritis. No significant abnormalities on the left. Signed by: Addison Lank   02/13/2021 9:38 AM    [place] foot left    Result Date: 03/21/2021  IMPRESSION: 1.  No discrete soft tissue mass at the plantar aspect of the left foot. 2.  Complex fluid collection within the dorsal tissues of the right foot, possibly representing a chronic seroma, hematoma or a posttraumatic adventitial bursa. In review of the electronic medical record, this has previously been aspirated. Signed by: [place] S Gross   03/21/2021 2:02 PM    [place] foot right    Result Date: 03/21/2021  IMPRESSION: 1.  No discrete soft tissue mass at the plantar aspect of the left foot. 2.  Complex fluid collection within the dorsal tissues of the right foot, possibly representing a chronic seroma, hematoma or a posttraumatic adventitial bursa. In review of the electronic medical record, this has previously been aspirated. Signed by: [place] S Gross   03/21/2021 2:02 PM    MR foot wo contrast right    Result Date: 09/26/2021  IMPRESSION: 1. Fluid collection overlying the dorsal midfoot, query chronic friction-related changes such as subcutaneous seroma or adventitial bursitis. Mild soft tissue edema along the dorsal base of digits,  could also be chronic friction related changes. 2. Diffuse fatty atrophy and T2 hyperintensity within the intrinsic foot musculature, suggestive of chronic denervation. Lonia Chimera, M.D., have reviewed this radiological study personally and I am in full agreement with the findings of the report presented above. Dictated by: Laural Benes   09/26/2021 6:32 PM Signed by: Pincus Badder   09/26/2021 8:49 PM    MR foot wo contrast left    Result Date: 06/24/2021  IMPRESSION: 1.  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 2.  Tenosynovitis of the extensor digitorum longus tendons. 3.  Nonspecific muscle edema within the plantar aspect of the forefoot. Lonia Chimera, M.D., have reviewed this radiological study personally and I am in full agreement with the findings of the report presented above. Dictated by: Servando Snare   06/23/2021 1:33 PM Signed by: Pincus Badder   06/24/2021 9:24 AM    XR finger pa+lat+obl left (3 views)    Result Date: 03/10/2021  IMPRESSION: Previously described the volar base avulsion fracture off of the fourth middle phalanx appears to be healing. There are no alignment changes when compared to XR 01/06/2021. Signed by: [place] S Gross   03/10/2021 2:13 PM    XR finger pa+lat+obl left (3 views)    Result Date: 01/06/2021  IMPRESSION: Redemonstration of a small avulsion fracture fragment at the volar base of the middle phalanx of the ring finger without significant change in alignment. Mild soft tissue fullness about the PIP joint. No significant arthritis. Signed by: Pincus Badder   01/06/2021 12:59 PM      EKG today - NSR, normal intervals, no ST changes, no Q waves, no BBBs        Assessment/Plan:       ICD-10-CM    1. Routine general medical examination at a health care facility  Z00.00 ECG 12-Lead Clinic Performed     Fecal Immunochemical Test     Hgb A1c     Comprehensive Metabolic Panel      2. Nonintractable epilepsy without status epilepticus, unspecified epilepsy type (HCC/RAF)  G40.909       3. 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)  T79.2XXA       4. Closed dislocation of interphalangeal joint of finger of left hand, initial encounter  S63.279A       5. Elevated cholesterol with elevated triglycerides  E78.2 atorvastatin 10 mg tablet      6. Overweight  E66.3       7. Prediabetes  R73.02 Hgb A1c      8. Umbilical hernia without obstruction and without gangrene  K42.9       9. White coat syndrome without hypertension  R03.0 ECG 12-Lead Clinic Performed      10. Other secondary hypertension  I15.8 ECG 12-Lead Clinic Performed            1. The preventive health counseling was completed as shown in the HPI and with the orders shown above.  Next preventive health exam will be in 1 year.      The following separately identifiable issues were evaluated during this visit:    Unlikely to have HTN but will check EKG to determine for any LVH that may indicate elevated BP at home (which has been denied by the patient.)    Problem   White Coat Syndrome Without Hypertension    HTN diagnosis in the past but likely was only white coat HTN as home readings consistently reassuring and home cuff has  correlated with office measurement on 12/02/2017, lip swelling with carvedilol and amlodipine, rash with losartan, sulfa allergy with HCTZ.  09/08/2018 - again home records reassuring with elevation in the office.  Good correlation between measurements on home BP cuff today and our automated cuff.  Therefore very unlikely to have HTN, but rather [facility]  09/30/2021 - elevated BP continues on office measurements, counseled to monitor at home  12/26/2021 - 140/93 today, will check EKG     Closed Dislocation of Interphalangeal Joint of Finger of Left Hand, Initial Encounter    09/20/2020 - seeing OT, wearing brace, to f/u with ortho, continue supportive care for now.  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 Mild soft tissue edema along the dorsal base of digits, could also be chronic friction related changes.''  Will f/u with Podiatry for definitive management  12/26/2021 - podiatry has referred patient to plastic for evaluation, appointment next month     Umbilical Hernia    05/27/2017 - reducible  01/03/2021 - eval wit surgery last month: ''patient has elected to continue non-operative management since his symptoms from the hernia are minimal at this point. We gave return precautions and patient will schedule follow up with [place] in 6 months to 1 year to revisit discussion for surgery''  09/30/2021 - stable but persisting symptoms, planning for surgery next year  12/26/2021 - reported increase in size 2 months ago but stable on exam today and nontender     Elevated Cholesterol With Elevated Triglycerides    01/21/2017 - ASCVD score about 10%, patient declines medicine, counseled on lifestyle interventions  09/29/2018 - briefly on Lipitor but stopped (headaches, bloating)   01/03/2021 - ASCVD 15.8%  09/30/2021 - again advised statin, will start Lipitor 10, counseled on side effects  12/26/2021 - stopped Lipitor, counseled on restarting     Prediabetes    Chronic, relatively stable  04/14/2019- 5.8, counseled on exercise including continue biking  01/03/2021 - 6.0, counseled on healthy foods  12/26/2021 - recently improving, counseled on diet/exercise     Epilepsy (Hcc/Raf)    chronically on Dilantin, no seizure in 26 years, followed by Neurologist (Dr. Wiliam Ke) at [facility]  01/10/2018 - well controlled, no change in Dilantin, continue management with neuro, recheck vit D today  12/26/2021 - stable on Dilantin without any seizure activity, check vit D today     Overweight    04/02/2016 - neg screen for OSA  12/26/2021 - stable, counseled on exercise, discussed medication options but patient declines at thistime         The above plan of care, diagnosis, orders, and follow-up were discussed with the patient.  Questions related to this recommended plan of care were answered.    Follow up: Return if symptoms worsen or fail to improve.    Lendon Collar Vic Blackbird, MD, MPH  12/26/2021 at 5:05 PM  Time of note filed does not reflect the time of encounter.  Portions of this note may have been created with voice recognition  software. Occasional wrong-word or ''sound-alike'' substitutions may have occurred due to the inherent limitations of voice recognition software. Please read the chart carefully and recognize, using context, where these substitutions have occurred.

## 2021-12-27 LAB — Hgb A1c: HGB A1C - HPLC: 5.7 — ABNORMAL HIGH (ref <5.7)

## 2021-12-27 LAB — Comprehensive Metabolic Panel
ALKALINE PHOSPHATASE: 106 U/L (ref 37–113)
UREA NITROGEN: 9 mg/dL (ref 7–22)

## 2021-12-30 ENCOUNTER — Ambulatory Visit: Payer: MEDICARE

## 2021-12-30 NOTE — Progress Notes
Surgical Oncology Progress Note    PATIENT: Dustin Watts  MRN: [id number]  DOB: [birth year]  DATE OF SERVICE: 12/30/2021   CONSULTING ATTENDING: Lowella Petties, MD    Reason for Visit:     umbilical hernia    History of Present Illness:     Dustin Watts is a 66 y.o. male with history of cerebral palsy, epilepsy, and hypertension who presents today for annual followup regarding his umbilical hernia which is currently being observed. The patient initially became aware of his hernia when he presented to an OSH in 2021 for separate chief complaint, was told that he had an umbilical hernia and subsequently referred to [facility] general surgery. When initially seen in clinic in 2021 was hesitant to pursue surgical repair but wanted to be followed in case symptoms worsened. Last seen in clinic 11/30/2020.     Pt states that last August he lost his footing and sustained a right foot injury which has made it difficult to walk. As a result, he has not been able to lose much weight. Also states that he has abdominal pain when squatting at the gym so he had avoided that activity. States that he is unsure if the bulge is reducible because he has not tried to push it back in. States that he is not in constant abdominal pain, denies fevers/chills, skin changes, or tenderness to touch to abdominal bulge.     History of prediabetes, states he is a nonsmoker.         Past Medical History:     Past Medical History:   Diagnosis Date   ? Acne    ? Arthritis    ? Cerebral palsy (HCC/RAF)    ? Eczema    ? Hypertension    ? Impaired glucose tolerance 03/23/2016    HGBA1C of 5.7 on 03/19/2016    ? Low vitamin D level 05/07/2016    05/07/2016 - taking antiepiletpic which may exacerbate it, started on ergo 50000, will recheck level in about 2 months    ? Overweight    ? Seizure (HCC/RAF) 1973    chronically on Dilantin, no seizure in 25 years, followed by Neurologist (Dr. Wiliam Ke) at [facility]   ? Shortening of arm, congenital right arm        Medications:     Current Outpatient Medications   Medication Instructions   ? ASPIRIN LOW DOSE 81 MG EC tablet TAKE 1 TABLET BY MOUTH EVERY DAY   ? atorvastatin (LIPITOR) 10 mg, Oral, Every night at bedtime   ? benzoyl peroxide-erythromycin 5-3% gel Apply to acne once daily.   ? Calcium Carb-Cholecalciferol (OYSTER SHELL CALCIUM W/D) 500-200 MG-UNIT TABS No dose, route, or frequency recorded.   ? DILANTIN 100 MG ER capsule TAKE 4 CAPSULES ORALLY AT BEDTIME   ? ibuprofen (ADVIL, MOTRIN) 600 mg, Oral, Daily PRN   ? RETIN-A MICRO PUMP 0.04 % gel Topical, Every night at bedtime   ? sulfacetamide-sulfur 9-4.5 % LIQD liquid Apply to face twice daily   ? Tretinoin Microsphere (RETIN-A MICRO PUMP) 0.06 % GEL APPLY PEA SIZED AMOUNT TOPICALLY TO THE FACE NIGHTLY        Review of Systems:     Negative aside from what is documented in HPI.    Objective:     Physical Exam:    Vital Signs: There were no vitals taken for this visit.  GEN: NAD, WDWN, alert & appropriate. Obese body habitus.  HEENT: Sclerae anicteric, NCAT,  EOMI.   CV: Regular rate.  RESP: Breathing unlabored on room air.  ABD: S/NT/ND, palpable masses or hernias. Reducible, soft umbilical hernia.   EXTR:  No clubbing, cyanosis or edema.   NEURO:  Grossly intact without any focal deficits.     Pathology:  FINAL DIAGNOSIS   Date Value Ref Range Status   07/31/2021   Final    A. FOOT, RIGHT (ASPIRATION):   - Proteinaceous cyst contents   - Negative for malignancy          Studies:  No images are attached to the encounter.     Labs:  Lab Results   Component Value Date    WBC 5.11 09/30/2021    WBC 6.44 02/14/2009    HGB 14.5 09/30/2021    HGB 14.0 02/14/2009    HCT 42.5 09/30/2021    HCT 41.7 02/14/2009    PLT 207 09/30/2021    PLT 183 02/14/2009    BUN 9 12/26/2021    BUN 13 04/18/2014    BUN 10 02/14/2009    CREAT 0.54 (L) 12/26/2021    CREAT 0.7 02/14/2009    NA 139 12/26/2021    NA 141 02/14/2009    K 4.1 12/26/2021    K 4.3 02/14/2009    CHOL 258 09/30/2021    CHOLHDL 52 09/30/2021    NOHDLCHOCAL 206 (H) 09/30/2021    TRIGLY 193 (H) 09/30/2021    ALT 18 12/26/2021    ALT 20 02/14/2009    AST 35 12/26/2021    AST 21 02/14/2009    HGBA1C 5.7 (H) 12/26/2021    HGBA1C 5.6 04/23/2008    TSH 1.5 01/21/2017    TSH 0.87 04/23/2008    INR 1.1 05/27/2017    INR 1.0 03/23/2005       Assessment:     Dustin Watts is a 66 y.o. male with history of cerebral palsy, epilepsy, and hypertension who presents today for annual followup regarding his umbilical hernia which is currently being observed. The patient initially became aware of his hernia when he presented to an OSH in 2021 for separate chief complaint, was told that he had an umbilical hernia and subsequently referred to [facility] general surgery. When initially seen in clinic in 2021 was hesitant to pursue surgical repair but wanted to be followed in case symptoms worsened. Last seen in clinic 11/30/2020.     Pt history and clinical exam consistent with reducible, umbilical hernia. Pt notably has gained weight (BMI increased from 30 to 36 from last year's visit). As hernia repair would be elective, recommend patient loses weight (goal of 200lb) before proceeding with further surgical planning.     Plan/Recommendations:     - recommend weight loss (200lb goal weight)  - will consider repair of umbilical hernia after patient obtains goal weight  - will schedule followup appointment in 6 months  - return precautions given    DWA Dr. Mordecai Maes.     Author:  Lovie Macadamia. Clelia Croft, MD PGY-1  Department of General Surgery  Surgical Oncology (C Service)  [phone]

## 2022-01-08 ENCOUNTER — Ambulatory Visit: Payer: MEDICAID

## 2022-01-08 ENCOUNTER — Telehealth: Payer: MEDICAID

## 2022-01-08 NOTE — Consults
PLASTIC SURGERY OUTPATIENT CONSULTATION    PATIENT:  Dustin Watts  MRN:  [id number]  DOB:  06-13-1956  DATE OF SERVICE:  01/08/2022    Referring Physician: Rexene Alberts    Reason for Consultation: Right dorsal foot mass    History of Present Illness: Dustin Watts is a 66 y.o. male with a history of fall 1.5 years ago with development of soft tissue mass on the right foot.  This has been aspirated several times and reaccumulates within an hour of aspiration.  Aspiration contents are ''fluid'' but not blood according to patient.  Has never been infected.  Is painful at night.  Prevents him from wearing shoes sometimes.  Has been referred by [facility] for second opinion.    Patient denies smoking.  Does not take blood thinners.    Past Medical History:   Past Medical History:   Diagnosis Date   ? Acne    ? Arthritis    ? Cerebral palsy (HCC/RAF)    ? Eczema    ? Hypertension    ? Impaired glucose tolerance 03/23/2016    HGBA1C of 5.7 on 03/19/2016    ? Low vitamin D level 05/07/2016    05/07/2016 - taking antiepiletpic which may exacerbate it, started on ergo 50000, will recheck level in about 2 months    ? Overweight    ? Seizure (HCC/RAF) 1973    chronically on Dilantin, no seizure in 25 years, followed by Neurologist (Dr. Wiliam Ke) at [facility]   ? Shortening of arm, congenital     right arm       Past Surgical History:   Past Surgical History:   Procedure Laterality Date   ? rectal abscess  2010    surgery done at [facility] of the [facility]       Medications:   Current Outpatient Medications:   ?  ASPIRIN LOW DOSE 81 MG EC tablet, TAKE 1 TABLET BY MOUTH EVERY Dustin, Disp: 90 tablet, Rfl: 3  ?  atorvastatin 10 mg tablet, Take 1 tablet (10 mg total) by mouth at bedtime., Disp: 90 tablet, Rfl: 3  ?  benzoyl peroxide-erythromycin 5-3% gel, Apply to acne once daily.., Disp: 46.6 g, Rfl: 11  ?  Calcium Carb-Cholecalciferol (OYSTER SHELL CALCIUM W/D) 500-200 MG-UNIT TABS, , Disp: , Rfl:   ?  DILANTIN 100 MG ER capsule, TAKE 4 CAPSULES ORALLY AT BEDTIME, Disp: , Rfl:   ?  ibuprofen 600 mg tablet, Take 1 tablet (600 mg total) by mouth daily as needed., Disp: 30 tablet, Rfl: 1  ?  RETIN-A MICRO PUMP 0.04 % gel, Apply topically at bedtime., Disp: 50 g, Rfl: 11  ?  sulfacetamide-sulfur 9-4.5 % LIQD liquid, Apply to face twice daily., Disp: 454 g, Rfl: 11  ?  Tretinoin Microsphere (RETIN-A MICRO PUMP) 0.06 % GEL, APPLY PEA SIZED AMOUNT TOPICALLY TO THE FACE NIGHTLY., Disp: 50 g, Rfl: 11      Allergies:   Allergies   Allergen Reactions   ? Topiramate      Eye swelling   ? Sulfa Antibiotics Angioedema     No anaphylaxis   ? Amlodipine Other (See Comments)     Lip swelling without anaphylaxis   ? Carvedilol Angioedema     Lip swelling without anaphylaxis   ? Losartan Rash     No hive, no anaphylaxis, unclear if truly has rash from this medicine   ? Penicillins Rash     ''bumps on body''  Family History:   Family History   Problem Relation Age of Onset   ? Heart disease Mother    ? Diabetes Mother    ? Kidney disease Mother    ? Stroke Brother    ? Colon cancer Neg Hx    ? Prostate cancer Neg Hx    ? Ovarian cancer Neg Hx    ? Breast cancer Neg Hx    ? Hyperlipidemia Neg Hx    ? Hypertension Neg Hx        Social History:   Social History     Socioeconomic History   ? Marital status: Married   ? Number of children: 0   Occupational History   ? 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 Smoking status: Never   ? Smokeless tobacco: Never   Vaping Use   ? Vaping Use: Never used   Substance and Sexual Activity   ? Alcohol use: No   ? Drug use: No   ? Sexual activity: Not Currently     Partners: Female     Comment: never married (incorrectly documented in Epic)   Other Topics Concern   ? Do you exercise at least a Dustin, 3 or more days a week? No   ? Types of Exercise? (List in Comments) Yes     Comment: biking infrequently, abdominal exercise   ? Do you follow a special diet? No   ? Vegan? No   ? Vegetarian? No ? Pescatarian? No   ? Lactose Free? No   ? Gluten Free? No   ? Omnivore? Yes       Review of Systems: A 14 point review of systems is negative except as per above.    Physical Exam:  Vitals: BP 143/84  ~ Pulse 82  ~ Ht 1.676 m (5' 6'')  ~ Wt (!) 103.4 kg (228 lb)  ~ SpO2 96%  ~ BMI 36.80 kg/m?   General: WD WN NAD, appears stated age.  Neuro: Grossly normal.  Resp: Breathing well on room air.  CV: RRR.  Abdomen: Soft NT ND.  Extremities: Right foot 5x4cm mobile mass on the right dorsal foot consistent with cyst.      Imaging:  MRI 07/30/2021  1. Fluid collection overlying the dorsal midfoot, query chronic friction-related changes such as subcutaneous seroma or adventitial bursitis. Mild soft tissue edema along the dorsal base of digits, could also be chronic friction related changes.   2. Diffuse fatty atrophy and T2 hyperintensity within the intrinsic foot musculature, suggestive of chronic denervation.     Recommendations:  Dustin Watts is a 66 y.o. male with a history of right dorsal foot cystic mass.  We discussed that aspiration has not been successful and declined aspiration today.  We discussed that sometimes seromas or cysts have walls that allow reaccumulation after aspiration, and that they must be managed with excision.  Discussed option of attempted excision of the cystic mass under local anesthesia.  Discussed risks of recurrence and that this does not guarantee it will not return.  Discussed the ability to send tissue to pathology.  Discussed concern for skin edge necrosis since skin over cyst is somewhat thin.  He is interested in proceeding.  I will have my scheduler reach out to him.  He should hold aspirin.    I spent a total of 30 minutes, >50% of the total spend on patient counseling and coordination  of care.    Author:    Berneice Gandy [phone])

## 2022-01-08 NOTE — Telephone Encounter
Left vm for pt to call me to schedule

## 2022-01-14 ENCOUNTER — Ambulatory Visit: Payer: BLUE CROSS/BLUE SHIELD

## 2022-01-14 DIAGNOSIS — M7752 Other enthesopathy of left foot: Secondary | ICD-10-CM

## 2022-01-14 DIAGNOSIS — M79671 Pain in right foot: Secondary | ICD-10-CM

## 2022-01-14 DIAGNOSIS — M79672 Pain in left foot: Secondary | ICD-10-CM

## 2022-01-14 NOTE — Progress Notes
PATIENT:Dustin Watts  ZOX:[id number]  DOB:March 06, 1956  DATE OF SERVICE: 01/14/2022     Referring Physician:Samras, Lendon Collar., MD, MPH    Reason for Referral: forefoot pain    History of Present Illness:     01/14/2022:  Dustin Watts is a 66 y.o. male who complains of pain with walking affecting the forefoot area of the left foot.   The patient relates having had pain for few months and has tried changing shoes without adequate relief.  The symptoms and deformity have worsened over that time.  Denies any history of injury/trauma to the area.   Staying off the foot seems to help.  Pain generally occurs with weightbearing activity and is worst when barefoot especially on hard surfaces.  Presents today to discuss treatment options. Patient had previous left 2nd MPJ injection which continues to given pain relief. Patient scheduled to have seroma excision right foot with plastic surgery in the near future.    Past Medical History:      Past Medical History:   Diagnosis Date   ? Acne    ? Arthritis    ? Cerebral palsy (HCC/RAF)    ? Eczema    ? Hypertension    ? Impaired glucose tolerance 03/23/2016    HGBA1C of 5.7 on 03/19/2016    ? Low vitamin D level 05/07/2016    05/07/2016 - taking antiepiletpic which may exacerbate it, started on ergo 50000, will recheck level in about 2 months    ? Overweight    ? Seizure (HCC/RAF) 1973    chronically on Dilantin, no seizure in 25 years, followed by Neurologist (Dr. Wiliam Ke) at [facility]   ? Shortening of arm, congenital     right arm       Past Surgical History:     Past Surgical History:   Procedure Laterality Date   ? rectal abscess  2010    surgery done at [facility] of the [facility]       Current Medications:       Current Outpatient Medications:   ?  ASPIRIN LOW DOSE 81 MG EC tablet, TAKE 1 TABLET BY MOUTH EVERY DAY, Disp: 90 tablet, Rfl: 3  ?  atorvastatin 10 mg tablet, Take 1 tablet (10 mg total) by mouth at bedtime., Disp: 90 tablet, Rfl: 3  ?  benzoyl peroxide-erythromycin 5-3% gel, Apply to acne once daily.., Disp: 46.6 g, Rfl: 11  ?  Calcium Carb-Cholecalciferol (OYSTER SHELL CALCIUM W/D) 500-200 MG-UNIT TABS, , Disp: , Rfl:   ?  DILANTIN 100 MG ER capsule, TAKE 4 CAPSULES ORALLY AT BEDTIME, Disp: , Rfl:   ?  ibuprofen 600 mg tablet, Take 1 tablet (600 mg total) by mouth daily as needed., Disp: 30 tablet, Rfl: 1  ?  RETIN-A MICRO PUMP 0.04 % gel, Apply topically at bedtime., Disp: 50 g, Rfl: 11  ?  sulfacetamide-sulfur 9-4.5 % LIQD liquid, Apply to face twice daily., Disp: 454 g, Rfl: 11  ?  Tretinoin Microsphere (RETIN-A MICRO PUMP) 0.06 % GEL, APPLY PEA SIZED AMOUNT TOPICALLY TO THE FACE NIGHTLY., Disp: 50 g, Rfl: 11    Allergies:     Allergies   Allergen Reactions   ? Topiramate      Eye swelling   ? Sulfa Antibiotics Angioedema     No anaphylaxis   ? Amlodipine Other (See Comments)     Lip swelling without anaphylaxis   ? Carvedilol Angioedema     Lip swelling without anaphylaxis   ?  Losartan Rash     No hive, no anaphylaxis, unclear if truly has rash from this medicine   ? Penicillins Rash     ''bumps on body''       Family History:     Family History   Problem Relation Age of Onset   ? Heart disease Mother    ? Diabetes Mother    ? Kidney disease Mother    ? Stroke Brother    ? Colon cancer Neg Hx    ? Prostate cancer Neg Hx    ? Ovarian cancer Neg Hx    ? Breast cancer Neg Hx    ? Hyperlipidemia Neg Hx    ? Hypertension Neg Hx        Social History:      Social History     Tobacco Use   Smoking Status Never   Smokeless Tobacco Never     Social History     Substance and Sexual Activity   Alcohol Use No         PHYSICAL EXAM     There were no vitals taken for this visit.    Gen:  No acute distress.  Demonstrates appropriate mood and affect.    The patient looks stated age and appears well nourished.    VASC:  Dorsalis Pedis artery:    Right foot present      Left foot present  Posterior Tibial artery:   Right foot present      Left foot present  There is brisk capillary refill time to all digits less than 3 seconds.     There are no varicosities or telangiectasias to the ankles and hindfeet.  There is no pitting edema.  Feet warm to touch.    NEURO:  Protective threshold sensation with monofilament wire testing and light touch sensation is intact to the forefoot bilaterally   There is no evidence of muscle weakness with range of motion testing to the joint of the foot or ankle    MUSCULOSKELETAL: The patient demonstrates tenderness to palpation to the 3rd metatarsophalangeal joint of the left foot.    There is not pain with range of motion to the toe  There is an associated hammertoe contracture  There is edema around the metatarsophalangeal joint   Alignment at the metatarsophalangeal joint is not normal  There is dorsal contracture to the toe at the metatarsophalangeal joint  No other areas about the foot are tender to palpation.    There is increased pronation to the foot    ASSESSMENT/PLAN     Dustin Watts is a 66 y.o. male with:  1. Capsulitis of toe of left foot        2. Pain in both feet           ? Discussion of findings  ? Agree with surgical removal of right foot soft tissue mass scheduled with Plastic surgery  ? Corticosteroid injection consisting of 0.5 cc of CELESTONE SOLUSPAN and 0.5 cc of 0.5% MARCAINE PLAIN was administered to the affected joint after carefully prepping the area with alcohol solution  ? A post-operative surgical shoe was dispensed to the patient  ? patient advised to wear stiff soled shoes to minimize bending of the toes when ambulating   ? Avoid hi impact activities or walking barefoot until symptoms resolve  ? ice, and NSAIDs PRN pain   ? NSAIDS for anti-inflammatory affect: 600 mg ibuprofen TID with meals for a 7 days course  unless it irritates their stomach.  ? The patient has had all questions answered satisfactorily and is in agreement with this recommended plan of care.     Author: Sandre Kitty, DPM, 01/14/2022

## 2022-01-27 LAB — Fecal Immunochemical Test: FECAL IMMUNOCHEMICAL TEST: NEGATIVE

## 2022-01-28 ENCOUNTER — Telehealth: Payer: BLUE CROSS/BLUE SHIELD

## 2022-01-28 MED ORDER — ASPIRIN LOW DOSE 81 MG PO TBEC
ORAL_TABLET | ORAL | 3 refills | 30.00000 days
Start: 2022-01-28 — End: ?

## 2022-01-28 NOTE — Telephone Encounter
Left vm for pt to call me

## 2022-01-29 MED ORDER — ASPIRIN LOW DOSE 81 MG PO TBEC
ORAL_TABLET | ORAL | 3 refills | 30.00000 days | Status: AC
Start: 2022-01-29 — End: ?

## 2022-01-30 ENCOUNTER — Non-Acute Institutional Stay: Payer: MEDICAID

## 2022-02-02 ENCOUNTER — Telehealth: Payer: MEDICAID

## 2022-02-02 NOTE — Telephone Encounter
Left vm for pt to call me

## 2022-02-16 ENCOUNTER — Inpatient Hospital Stay: Payer: BLUE CROSS/BLUE SHIELD

## 2022-02-17 ENCOUNTER — Ambulatory Visit: Payer: MEDICAID

## 2022-03-19 ENCOUNTER — Ambulatory Visit: Payer: BLUE CROSS/BLUE SHIELD

## 2022-03-20 ENCOUNTER — Institutional Professional Consult (permissible substitution): Payer: MEDICAID

## 2022-03-20 DIAGNOSIS — Z01810 Encounter for preprocedural cardiovascular examination: Secondary | ICD-10-CM

## 2022-03-20 DIAGNOSIS — K648 Other hemorrhoids: Secondary | ICD-10-CM

## 2022-03-20 LAB — Differential Automated: LYMPHOCYTE PERCENT, AUTO: 31.9 (ref 0.00–0.04)

## 2022-03-20 LAB — CBC: WHITE BLOOD CELL COUNT: 4.32 10*3/uL (ref 4.16–9.95)

## 2022-03-20 NOTE — Progress Notes
[facility] INTERNAL MEDICINE & PEDIATRICS  [facility]  [address]  [address]  [place] [address]  Phone: [phone]  FAX: [phone]  [phone]    Office Note     Subjective:     CC: Pre-operative evaluation    HPI:   Dustin Watts is a 65 y.o. male who presents for the following issue(s).    1. Pre-operative evaluation  - requested by Dr. Berneice Gandy  - procedure: excision of soft tissue mass on right foot on 04/16/22  - indication: seroma  - anesthesia type: unknown  - able to walk up >2 flights of stairs or 2 city blocks without symptoms: yes  - no history of CKD, CHF, CAD, IDDM, CVA  - invasive cardiovascular history: no history of coronary stents, pacemaker or AICD  - history of known anesthesia complications: None  - loose teeth or recent dental surgeries: No  - history of OSA: No     Review of Systems  On review of systems, all of the following were normal except for the bolded items which were abnormal:  Const - fevers  Eyes - vision change  ENT/mouth - rhinorrhea, sore throat  CV - CP  Resp - cough, SOB  GI - nausea, vomiting, abd pain, diarrhea, constipation  Musc - body aches  Skin - lesions, rash  Neuro - weakness, numbness  Hem/lymph - bleeding, LAD    Past Medical/Surgical/Family/Social History  The following past medical history was reviewed with the patient.  Past Medical History:   Diagnosis Date   ? Acne    ? Arthritis    ? Cerebral palsy (HCC/RAF)    ? Eczema    ? Hypertension    ? Impaired glucose tolerance    ? Low vitamin D level    ? obese    ? Seizure (HCC/RAF) 1973    chronically on Dilantin, no seizure in 25 years, followed by Neurologist (Dr. Wiliam Ke) at [facility]   ? Shortening of arm, congenital     right arm       The following past surgical history was reviewed with the patient.  Past Surgical History:   Procedure Laterality Date   ? rectal abscess  2010    surgery done at [facility] [place]       The following social history was reviewed with the patient.  Social History     Socioeconomic History   ? Marital status: Married   ? Number of children: 0   Occupational History   ? 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 Smoking status: Never   ? Smokeless tobacco: Never   Vaping Use   ? Vaping Use: Never used   Substance and Sexual Activity   ? Alcohol use: No   ? Drug use: No   ? Sexual activity: Not Currently     Partners: Female     Comment: never married (incorrectly documented in Epic)   Other Topics Concern   ? Do you exercise at least a day, 3 or more days a week? No   ? Types of Exercise? (List in Comments) Yes     Comment: biking infrequently, abdominal exercise   ? Do you follow a special diet? No   ? Vegan? No   ? Vegetarian? No   ? Pescatarian? No   ? Lactose Free? No   ?  Gluten Free? No   ? Omnivore? Yes       The following family history was reviewed with the patient.   Family History   Problem Relation Age of Onset   ? Heart disease Mother    ? Diabetes Mother    ? Kidney disease Mother    ? Stroke Brother    ? Colon cancer Neg Hx    ? Prostate cancer Neg Hx    ? Ovarian cancer Neg Hx    ? Breast cancer Neg Hx    ? Hyperlipidemia Neg Hx    ? Hypertension Neg Hx        Medications/Supplements  The following medication list was reviewed with the patient.    Current Outpatient Medications:   ?  ASPIRIN LOW DOSE 81 MG EC tablet, TAKE 1 TABLET BY MOUTH EVERY DAY, Disp: 90 tablet, Rfl: 3  ?  benzoyl peroxide-erythromycin 5-3% gel, Apply to acne once daily.., Disp: 46.6 g, Rfl: 11  ?  Calcium Carb-Cholecalciferol (OYSTER SHELL CALCIUM W/D) 500-200 MG-UNIT TABS, , Disp: , Rfl:   ?  DILANTIN 100 MG ER capsule, TAKE 4 CAPSULES ORALLY AT BEDTIME, Disp: , Rfl:   ?  ibuprofen 600 mg tablet, Take 1 tablet (600 mg total) by mouth daily as needed., Disp: 30 tablet, Rfl: 1  ?  RETIN-A MICRO PUMP 0.04 % gel, Apply topically at bedtime., Disp: 50 g, Rfl: 11  ?  sulfacetamide-sulfur 9-4.5 % LIQD liquid, Apply to face twice daily., Disp: 454 g, Rfl: 11  ?  Tretinoin Microsphere (RETIN-A MICRO PUMP) 0.06 % GEL, APPLY PEA SIZED AMOUNT TOPICALLY TO THE FACE NIGHTLY., Disp: 50 g, Rfl: 11  ?  atorvastatin 10 mg tablet, Take 1 tablet (10 mg total) by mouth at bedtime. (Patient not taking: Reported on 03/20/2022.), Disp: 90 tablet, Rfl: 3    Objective:   Physical Exam  BP 150/95 Comment: manual left arm ~ Pulse 99  ~ Temp 36.8 ?C (98.3 ?F) (Tympanic)  ~ Ht 5' 6'' (1.676 m)  ~ SpO2 97%  ~ BMI 36.80 kg/m?    Wt Readings from Last 3 Encounters:   01/08/22 (!) 228 lb (103.4 kg)   12/30/21 (!) 227 lb (103 kg)   09/30/21 222 lb 3.2 oz (100.8 kg)     Constitutional - alert, no distress  Eyes - clear conjunctiva, pupils reactive  Ears, Nose, Mouth and Throat - lips/mucosa/tongue normal, posterior oropharynx normal without exudate  Neck - supple with midline trachea, no thyromegaly, no lymphadenopathy  Respiratory - clear to auscultation bilaterally, respirations unlabored  Cardiovascular - regular rhythm, S1 and S2 normal, no murmur, no lower extremity edema  Gastrointestinal - soft, nontender, no hepatomegaly, no splenomegaly, umbilical hernia reducible.  On anoscopy a <1cm nonthrombosed hemorrhoid was noted without significant tenderness  Skin - no rash or lesions, texture and turgor normal, 4cm x 5cm soft mass on the dorsum of the R foot  Neuro - normal strength throughout, normal speech, normal balance  Psychiatric - normal insight and judgement, normal mental status with adequate orientation    Labs  I personally reviewed the following labs  Lab Results   Component Value Date    WBC 5.11 09/30/2021    HGB 14.5 09/30/2021    HCT 42.5 09/30/2021    MCV 100.2 (H) 09/30/2021    PLT 207 09/30/2021     Lab Results   Component Value Date    NA 139 12/26/2021    K 4.1 12/26/2021  CL 103 12/26/2021    CO2 23 12/26/2021    BUN 9 12/26/2021    CREAT 0.54 (L) 12/26/2021    GLUCOSE 108 (H) 12/26/2021    CALCIUM 9.6 12/26/2021 ALT 18 12/26/2021    AST 35 12/26/2021    ALKPHOS 106 12/26/2021    BILITOT 0.2 12/26/2021     Lab Results   Component Value Date    HGBA1C 5.7 (H) 12/26/2021     Lab Results   Component Value Date    TSH 1.5 01/21/2017     Lab Results   Component Value Date    INR 1.1 05/27/2017    INR 1.0 03/23/2005    PT 11.2 05/27/2017    PT 10.0 03/23/2005     Lab Results   Component Value Date    CHOL 258 09/30/2021    CHOLHDL 52 09/30/2021    CHOLDLCAL 167 (H) 09/30/2021    TRIGLY 193 (H) 09/30/2021     Lab Results   Component Value Date    VITD25OH 37 09/30/2021     Lab Results   Component Value Date    FOLATE 30.9 (H) 09/30/2021     Lab Results   Component Value Date    VITAMINB12 2,912 (H) 09/30/2021         Studies  [place] foot left    Result Date: 03/21/2021  IMPRESSION: 1.  No discrete soft tissue mass at the plantar aspect of the left foot. 2.  Complex fluid collection within the dorsal tissues of the right foot, possibly representing a chronic seroma, hematoma or a posttraumatic adventitial bursa. In review of the electronic medical record, this has previously been aspirated. Signed by: [place] S Gross   03/21/2021 2:02 PM    [place] foot right    Result Date: 03/21/2021  IMPRESSION: 1.  No discrete soft tissue mass at the plantar aspect of the left foot. 2.  Complex fluid collection within the dorsal tissues of the right foot, possibly representing a chronic seroma, hematoma or a posttraumatic adventitial bursa. In review of the electronic medical record, this has previously been aspirated. Signed by: [place] S Gross   03/21/2021 2:02 PM    MR foot wo contrast right    Result Date: 09/26/2021  IMPRESSION: 1. Fluid collection overlying the dorsal midfoot, query chronic friction-related changes such as subcutaneous seroma or adventitial bursitis. Mild soft tissue edema along the dorsal base of digits, could also be chronic friction related changes. 2. Diffuse fatty atrophy and T2 hyperintensity within the intrinsic foot musculature, suggestive of chronic denervation. Lonia Chimera, M.D., have reviewed this radiological study personally and I am in full agreement with the findings of the report presented above. Dictated by: Laural Benes   09/26/2021 6:32 PM Signed by: Pincus Badder   09/26/2021 8:49 PM    MR foot wo contrast left    Result Date: 06/24/2021  IMPRESSION: 1.  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 2.  Tenosynovitis of the extensor digitorum longus tendons. 3.  Nonspecific muscle edema within the plantar aspect of the forefoot. Lonia Chimera, M.D., have reviewed this radiological study personally and I am in full agreement with the findings of the report presented above. Dictated by: Servando Snare   06/23/2021 1:33 PM Signed by: Pincus Badder   06/24/2021 9:24 AM        Assessment/Plan:       ICD-10-CM    1. Preop cardiovascular exam  Z01.810 CBC & Auto Differential  2. 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 Elevated MCV  R71.8       4. Low vitamin D level  R79.89       5. White coat syndrome without hypertension  R03.0       6. Class 2 severe obesity due to excess calories with serious comorbidity and body mass index (BMI) of 36.0 to 36.9 in adult (HCC/RAF)  E66.01     Z68.36       7. Other hemorrhoids  K64.8             Preoperative evaluation - Per 2014 ACC/AHA guidelines, this patient is able to exert more than 4 METs without cardiovascular symptoms, has a reassuring exam, does not have clinical signs of an acute exacerbation of a chronic disease, does not have known history of IDDM/CVA/CAD/CKI/CHF, and can proceed to this intermediate risk surgery without further cardiovascular testing.  A letter summarizing this evaluation was sent to the surgeon.  Patient advised to stop antiplatelet medicines 1 week prior to the surgery.      Problem   White Coat Syndrome Without Hypertension    HTN diagnosis in the past but likely was only white coat HTN as home readings consistently reassuring and home cuff has correlated with office measurement on 12/02/2017, lip swelling with carvedilol and amlodipine, rash with losartan, sulfa allergy with HCTZ.  09/08/2018 - again home records reassuring with elevation in the office.  Good correlation between measurements on home BP cuff today and our automated cuff.  Therefore very unlikely to have HTN, but rather [facility]  09/30/2021 - elevated BP continues on office measurements, counseled to monitor at home  12/26/2021 - 140/93 today, EKG reassuring  03/20/2022 - 150/95 today manual in L arm, confirmed that home results were generally <140/80     Other Hemorrhoids    03/20/2022 - <1cm external hemorrhoid noted on anoscopy today at the anal verge, counseled on management including fiber and stool softeners prn for constipation, f/u if persists.     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 Mild soft tissue edema along the dorsal base of digits, could also be chronic friction related changes.''  Will f/u with Podiatry for definitive management  12/26/2021 - podiatry has referred patient to plastic for evaluation, appointment next month  03/20/2022 - preop today, can proceed without further cardiovascular evaluation     Low Vitamin D Level    taking antiepiletpic which may decrease vit D level  03/04/2018- adequate recent level, can change from weekly to daily supplementation  09/30/2021 - 37  03/20/2022 - counseled on regular supplementation     Class 2 Severe Obesity Due to Excess Calories With Serious Comorbidity and Body Mass Index (Bmi) of 36.0 to 36.9 in Adult (Hcc/Raf)    04/02/2016 - neg screen for OSA  03/20/2022 - counseled on exercise, also goal of weight loss to 200 for umbilical surgery             The above plan of care, diagnosis, orders,  and follow-up were discussed with the patient.  Questions related to this recommended plan of care were answered.    Follow up: as needed    Lendon Collar. Vic Blackbird, MD, MPH  03/20/2022 at 1:19 PM  Time of note filed does not reflect the time of encounter.  45 minutes were spent personally by me today on this encounter which include today's pre-visit review of the chart, obtaining appropriate history, performing an evaluation, documentation and discussion of management with details supported within the note for today's visit. The time documented was exclusive of any time spent on the separately billed procedure.

## 2022-03-27 ENCOUNTER — Ambulatory Visit: Payer: MEDICAID

## 2022-04-01 ENCOUNTER — Telehealth: Payer: MEDICAID

## 2022-04-01 NOTE — Telephone Encounter
Patient Phone Messages:     MD office returned call   x Patient returned call    Patient called with questions regarding surgery     Comments:

## 2022-04-02 ENCOUNTER — Ambulatory Visit: Payer: MEDICAID

## 2022-04-08 MED ORDER — OXYCODONE HCL 5 MG PO TABS
5-10 mg | ORAL_TABLET | ORAL | 0 refills | 1.00 days | Status: AC | PRN
Start: 2022-04-08 — End: ?
  Filled 2022-04-09 (×2): qty 10, 1d supply, fill #0

## 2022-04-08 MED ORDER — CLINDAMYCIN HCL 300 MG PO CAPS
300 mg | ORAL_CAPSULE | Freq: Four times a day (QID) | ORAL | 0 refills | 5.00000 days | Status: AC
Start: 2022-04-08 — End: ?
  Filled 2022-04-09: qty 20, 5d supply, fill #0

## 2022-04-08 MED ORDER — ACETAMINOPHEN 325 MG PO TABS
650 mg | ORAL_TABLET | Freq: Four times a day (QID) | ORAL | 1 refills | PRN
Start: 2022-04-08 — End: ?

## 2022-04-08 MED ADMIN — CLINDAMYCIN PHOSPHATE 300 MG/2ML IJ SOLN: INTRAVENOUS | @ 21:00:00 | Stop: 2022-04-08 | NDC 00009087026

## 2022-04-08 MED ADMIN — LIDOCAINE HCL (CARDIAC) 100 MG/5ML IV SOSY: INTRAVENOUS | @ 21:00:00 | Stop: 2022-04-08 | NDC 76329339001

## 2022-04-08 MED ADMIN — SODIUM CHLORIDE 0.9 % IV SOLN: INTRAVENOUS | @ 20:00:00 | Stop: 2022-04-08 | NDC 00338004904

## 2022-04-08 MED ADMIN — LIDOCAINE-EPINEPHRINE 1 %-1:100000 IJ SOLN: @ 21:00:00 | Stop: 2022-04-09 | NDC 00409317801

## 2022-04-08 MED ADMIN — DEXMEDETOMIDINE HCL 200 MCG/2ML IV SOLN: INTRAVENOUS | @ 20:00:00 | Stop: 2022-04-08 | NDC 42023014625

## 2022-04-08 MED ADMIN — ACETAMINOPHEN 10 MG/ML IV SOLN: INTRAVENOUS | @ 21:00:00 | Stop: 2022-04-08 | NDC 63323043400

## 2022-04-08 MED ADMIN — DEXMEDETOMIDINE HCL 200 MCG/2ML IV SOLN: INTRAVENOUS | @ 21:00:00 | Stop: 2022-04-08 | NDC 42023014625

## 2022-04-08 MED ADMIN — PHENYLEPHRINE HCL 10 MG/ML IV SOLN: INTRAVENOUS | @ 21:00:00 | Stop: 2022-04-08 | NDC 00641618810

## 2022-04-08 MED ADMIN — GLYCOPYRROLATE 1 MG/5ML IJ SOLN: INTRAVENOUS | @ 20:00:00 | Stop: 2022-04-08 | NDC 70700016725

## 2022-04-08 MED ADMIN — ONDANSETRON HCL 4 MG/2ML IJ SOLN: INTRAVENOUS | @ 21:00:00 | Stop: 2022-04-08 | NDC 60505613005

## 2022-04-08 MED ADMIN — PROPOFOL 200 MG/20ML IV EMUL: INTRAVENOUS | @ 21:00:00 | Stop: 2022-04-08 | NDC 63323026929

## 2022-04-08 MED ADMIN — MIDAZOLAM HCL 10 MG/10ML IJ SOLN: INTRAVENOUS | @ 20:00:00 | Stop: 2022-04-08 | NDC 00409258705

## 2022-04-08 MED ADMIN — DEXAMETHASONE SODIUM PHOSPHATE 4 MG/ML IJ SOLN: INTRAVENOUS | @ 21:00:00 | Stop: 2022-04-08 | NDC 67457042312

## 2022-04-08 MED ADMIN — FENTANYL CITRATE (PF) 100 MCG/2ML IJ SOLN: INTRAVENOUS | @ 21:00:00 | Stop: 2022-04-08 | NDC 00409909422

## 2022-04-08 MED ADMIN — PROPOFOL 200 MG/20ML IV EMUL (ANES): INTRAVENOUS | @ 21:00:00 | Stop: 2022-04-08

## 2022-04-08 NOTE — H&P
UPDATED H&P REQUIREMENT    For [facility] and [facility]    WHAT IS THE STATUS OF THE PATIENT'S MOST CURRENT HISTORY AND PHYSICAL?   - The most current H&P is >24 hours and <30 days, and having examined the patient, I attest that there have been no changes. (This suffices as an update to the H&P).      REFER TO MEDICAL STAFF POLICIES REGARDING PRE-PROCEDURE HISTORY AND PHYSICAL EXAMINATION AND UPDATED H&P REQUIREMENTS BELOW:    [facility] and [facility] Medical Staff Policy 200 - For Patients Undergoing Procedures Requiring Moderate or Deep Sedation, General Anesthesia or Regional Anesthesia    Contents of a History and Physical Examination (H&P):    The H&P shall consist of chief complaint, history of present illness, allergies and medications, relevant social and family history, past medical history, review of systems and physical examination, and assessment and plan appropriate to the patient's age.    For Patients Undergoing Procedures Requiring Moderate or Deep Sedation, General Anesthesia or Regional Anesthesia:    1. An H&P shall be performed within 24 hours prior to the procedure by a qualified member of the medical staff or designee with appropriate privileges, except as noted in item 2 below.    2. If a complete history and physical was performed within thirty (30) calendar days prior to the patient's admission to the Medical Center for elective surgery, a member of the medical staff assumes the responsibility for the accuracy of the clinical information and will need to document in the medical record within twenty-four (24) hours of admission and prior to surgery or major invasive procedure, that they either attest that the history and physical has been reviewed and accepted, or document an update of the original history and physical relevant to the patient's current  clinical status.    3. Providing an H&P for patients undergoing surgery under local anesthesia is at the discretion of the Attending Physician.     4. When a procedure is performed by a dentist, podiatrist or other practitioner who is not privileged to perform an H&P, the anesthesiologist's assessment immediately prior to the procedure will constitute the 24 hour re-assessment.The dentist, podiatrist or other practitioner who is not privileged to perform an H&P will document the history and physical relevant to the procedure.    5. If the H&P and the written informed consent for the surgery or procedure are not recorded in the patient's medical record prior to surgery, the operation shall not be performed unless the attending physician states in writing that such a delay could lead to an adverse event or irreversible damage to the patient.    6. The above requirements shall not preclude the rendering of emergency medical or surgical care to a patient in dire circumstances.

## 2022-04-08 NOTE — Op Note
1510 Dr Allena Earing paged for instructions on weight bearing and boot to right foot per pt's question.  1550 Dr Allena Earing paged again for question regarding boot and weight bearing on right foot. Pt tolerating juice and water, denies pain and denies nausea.  1630 Dr Allena Earing paged again for question regarding boot and weight bearing on right foot.  22 Dr Allena Earing paged again for question regarding boot and weight bearing on right foot, message left at office number as well.  1645 Pt ambulated to restroom with steady gait and balance unassisted.  1710 Pt discharged home via wheelchair in stable condition with sister.

## 2022-04-08 NOTE — Op Note
1430 - Received report from anesthesia and surgery. Pt comfortable and denies pain. Belongings at bedside. Pt sister called.     1449 - Pt sitting up drinking juice. Denies pain. Sister picking up medications.     1455 - Gave report to RN Roxy.

## 2022-04-08 NOTE — Op Note
1510: Received report from [facility] [place] instructions given to pt, paged Dr. Allena Earing regarding, weight bearing status, possible boot. No response yet, pt does not complain of any pain, tolerating juice.     1521: Report given to [facility].

## 2022-04-08 NOTE — Discharge Instructions
Until Cleared by Surgeon at Clinic Follow-up Appointments:     Diet:  Regular diet - soft foods    No crunchy or chewy foods  Increase protein intake for wound healing    Activity:   Avoid strenuous activity - walking OK   Avoid activities that could injure surgical area(s)  Keep surgical area elevated to assist in decreasing post-operative swelling   Sleep with leg elevated on pillows to help decrease swelling   No direct pressure or sleeping on surgical area(s)    Medications:  Take your medications as prescribed.   Pain control: You may take Acetaminophen (Tylenol) as needed for pain, do not exceed recommended dosage from all sources.   You can expect to wean off your pain medications over the next few days to weeks depending on your pain tolerance and procedure performed.    Make sure to take stool softeners such as Colace or laxatives such as Miralax daily to prevent constipation while on narcotic pain medications.  You have been provided with a narcotic prescription to last until your initial post operative appointment. Additional narcotic prescriptions will be provided to you by your surgeon at your clinic visits after you have been examined. Please contact your surgeons office directly   Do not take aspirin until cleared by surgeon     Wound Care:   Avoid nicotine exposure   Avoid sun exposure to surgical area due to chance of sunburn   You may shower with indirect water stream onto surgical area. No water submersion (no pool, no Jacuzzi, no bath, no sauna)   Do not apply any lotion, cologne or deodorant on or near  your incisions unless prescribed  Swelling and any bruising will decrease over the next few weeks to months    Follow-up    Call [phone] or your Surgeon?s office directly to make follow-up appointment in 1-2 [facility]) for post-operative check-up.  Please contact surgeon's office to confirm follow up timeline.    When to call your Doctor:    For NON-urgent medical issues, questions, prescription refills, or to schedule or change an appointment, please message your provider through [email or web address]   [email or web address] allows you to:  o   Contact members of your care team, including your surgeon  o   Ask non-urgent medical questions  o   Send your MD photo(s)  o   Request referrals  o   View lab and test results  o   Request prescription refills  o   Request, change, or cancel appointments    For URGENT medical issues, including:  o   Fever>101o F  o   Shortness of breath  o   Chest pain  o   Calf/leg pain  o   Redness, warmth, swelling or unusual drainage from wound(s) or tube(s)  o   Fluid leaking around the tube(s)  o   Bright red or excessive drainage from the incision(s) or in the drain(s)    During business hours (from 8:00 AM to 5:00 PM), please call your surgeon?s office directly or call [phone].  Afterhours (from 5:00 PM to 8:00 AM) please page the Plastic Surgery resident on call at [phone] or go to the emergency room. (This should not be used for non-urgent requests such as scheduling appointments, requesting non-urgent medications refills, etc.)   DISCHARGE INSTRUCTIONS: POST OPERATIVE  GENERAL ANESTHESIA - ADULT    YOUR ANESTHESIOLOGIST WAS:     DIET     Your diet may be  based on your own preference:    Begin with clear liquids, such as sodas, apple juice, tea and such. DO NOT use a straw for drinking, as this increases the amount of air swallowed and can increase ''bloating'' and stomach distress.    If liquids are tolerated, progress slowly to your usual diet.    NOTE: Intermittent nausea, with or without vomiting, is a common side effect following general anesthesia. If this occurs, DO NOT eat or drink anything until it has subsided. If the nausea or vomiting persists for more than 24 hours. Contact your physician or report to your closest emergency room.    ACTIVITY     You may gradually return to your usual activity (if not restricted by other instructions). Sedative medications may linger in the body for 24-48 hours. Therefore it is not uncommon to feel somewhat drowsy or dizzy the remainder of the day following their administration:    Avoid bending over as this may enhance dizziness.  Do not drive an automotive vehicle or operate any machinery (including home appliances) for 24 hours.  Do not make sudden movements changing position from reclining to upright.  Do not sign any legal documents or make any important decisions for 24 hours.  Do not drink alcohol for 24 hours.    ADDITIONAL INSTRUCTIONS     It is not uncommon to tire easily and/or feel exhausted the day following surgery. Do not be alarmed, as this is normal. Plan activities that will allow you to ''pace'' yourself and/or rest as needed.    For your protection and safety, we strongly advise a responsible adult remain with you for the rest of the surgical day and night. Also, you should not be responsible for the care of others (children/adults) for 24 hours post sedation and a responsible adult should be present to assume those responsibilities.    You should take several deep breaths and give several forceful coughs every hour during the initial post-operative day. This will help to clear mucus, and keep the lungs appropriately inflated.    If you need advice or have any questions regarding ANESTHESIA, please call the appropriate number and ask to speak to an available anesthesiologist:    [phone] WEEKDAYS until 5:00 PM    [phone] if AFTER HOURS, or WEEKENDS/HOLIDAYS  (This is the page operator: ask to have the faculty anesthesiologist paged)    PATIENT EXPERIENCE        We listen. We care.  Respectful care means listening to you. At [facility], we partner with you to achieve your health goals. Our doctors, nurses and other health care staff are committed to providing exceptional care with kindness and respect.    At  [facility], you should always feel:    Comfortable and safe  Listened to and heard  Valued and respected  If you ever experience anything less from [facility], we want to hear from you.    Please visit:   [email or web address]         or contact     Ardyth Harps [facility] Patient Experience    [address]  [place], [place] [address] (map)  Phone: [phone]

## 2022-04-09 NOTE — Op Note
Plastic Surgery Operation Report  04/08/2022     Pre-Operative Diagnosis:  Right foot dorsal cyst 5cm    Post-Operative Diagnosis:   Same    Operation Title(s):  Excisional biopsy of right foot dorsal cyst  Neuroplasty, exploration and neurolysis and decompression from cyst    Surgeon:   Allena Earing     Anesthesia: MAC and local     Indication For Surgery:   66 year old male with a cystic structure in the subcutis of his dorsal right foot for about 2 years.  This has been aspirated several times by other providers but recurs relatively rapidly.  Has never been infected and is painful at night.  Impedes wearing footwear.  Patient interested in removal.     Description Of The Procedure:   The patient was identified in preoperative holding and the right foot was marked.  He was taken to the OR, given clindamycin, mac anesthesia induced.  A ring block with 10 cc of 1% lidocaine with epinephrine was infiltrated around his lesion, which was clearly palpable.  A linear incision was marked.  He was prepped with betadine and draped.  The incision was made and a cystic structure was immediately visible underneath.  Blunt dissection was used to separate from surrounding tissues.  It was noted that the surrounding tissues were relatively adherent.  Two branches of a nerve were identified traversing directly through the cystic structure.  The cyst was therefore opened with clear fluid and the nerves were carefully dissected away from cystic tissues under loupe magnification.  Once the nerves were free and spared, the remaining cyst was removed from surrounding tissues and sent to pathology.  The wound was irrigated with saline.  There was no unexpected bleeding.  The incision was then closed with 3-0 deep dermal monocryl and 4-0 running nylon.  A soft bandage was placed.  He was taken to PACU in stable condition.     Complication(s): None     Estimated Blood Loss:   2cc     Specimen(s):   Right foot cyst     Findings:    Nerve entrapped in cyst, freed      Drains:   None

## 2022-04-14 LAB — Tissue Exam

## 2022-04-16 ENCOUNTER — Ambulatory Visit: Payer: MEDICAID

## 2022-04-16 NOTE — Progress Notes
PLASTIC SURGERY POSTOPERATIVE NOTE    Patient: Dustin Watts  MRN: [id number]  DOB: Aug 20, 1956  Date of Service: 04/16/2022    Date of Surgery: 04/08/2022  Surgery: Right foot cyst excision and neuroplasty    History of Present Illness: Mr. Dustin Watts is a 66 y.o. male who is status post above.  He has been elevating and healing well.  He denies fevers or chills.     Physical Exam:  BP 151/87  ~ Pulse 91  ~ Temp 36.2 C (97.2 F) (Tympanic)  ~ SpO2 98%   General: WD WN NAD, appears stated age.  Neuro: Grossly normal.  Resp: Breathing well on room air.  CV: RRR.  Abdomen: Soft NT ND.  Extremities: WWP.  Right foot minimal swelling.  Incision healing well, no signs of infection.  Diminished sensation in the DPN distribution, full sensation in SPN.    FINAL DIAGNOSIS       A. FOOT, CYST, RIGHT (EXCISION):   - Benign cyst and fibrous tissue             Recommendations:  Dustin Watts is a 65 y.o. male who is status post above.  Discussed that cyst was benign.  Discussed that his nerves were visualized intact and were separated from cyst but may have element of neuropraxia which will likely improve with time.  Discussed that the worst possible scenario is the current numbness in the first webspace.    He is also inquiring about intermittent non painful swelling around the left ankle.  Suggested that he follow up with podiatry and that MRI may be helpful if they feel it is indicated.  Return in 1 week for suture removal.      I spent a total of 20 minutes, >50% of the total spend on patient counseling and coordination of care.     Author:    Berneice Gandy [phone])

## 2022-04-23 ENCOUNTER — Telehealth: Payer: BLUE CROSS/BLUE SHIELD

## 2022-04-23 ENCOUNTER — Ambulatory Visit: Payer: BLUE CROSS/BLUE SHIELD

## 2022-04-23 NOTE — Progress Notes
PLASTIC SURGERY POSTOPERATIVE NOTE    Patient: Dustin Watts  MRN: [id number]  DOB: 14-Mar-1956  Date of Service: 04/23/2022    Date of Surgery: 04/08/2022  Surgery: Right foot cyst excision and neuroplasty    History of Present Illness: Mr. Ardoin is a 66 y.o. male who is status post above.  Denies fevers and chills.  No issues.        Physical Exam:  There were no vitals taken for this visit.  General: WD WN NAD, appears stated age.  Neuro: Grossly normal.  Resp: Breathing well on room air.  CV: RRR.  Abdomen: Soft NT ND.  Extremities: WWP.  Right foot healing well without signs of infection.  Sutures in place.       Recommendations:  TYLAND KLEMENS is a 66 y.o. male who is status post above.  Sutures removed  Follow up as needed.      I spent a total of 20 minutes, >50% of the total spend on patient counseling and coordination of care.     Author:    Berneice Gandy [phone])

## 2022-04-24 NOTE — Telephone Encounter
Called pt left [facility]mg informing him Dr. Vic Blackbird will be out of the office from 06/19 to 07/10. If pt would like to be seen this month than he can schedule with any provider in the office.

## 2022-04-24 NOTE — Telephone Encounter
Appointment Accommodation Request      Appointment Type: Post op/return    Reason for sooner request: Pt had surgery on right foot on 04/08/22. Pt stated stitches were removed today. Per pt wanted to also inform Dr. Vic Blackbird that he will not be seeing Dr. Jamelle Haring again. Per pt felt the docttor was not nice     Date/Time Requested (If any): Morning appointment this month    Last seen by MD:     Any Symptoms:  [x]  Yes  []  No       If yes, what symptoms are you experiencing:  Little pain, little blood right foot    o Duration of symptoms (how long): after stitches have been removed    Patient or caller was offered an appointment but declined.    Patient or caller was advised to seek emergency services if conditions are urgent or emergent.    Patient or caller has been notified of the turnaround time of 1-2 business (days).

## 2022-05-22 ENCOUNTER — Ambulatory Visit: Payer: MEDICAID

## 2022-05-22 DIAGNOSIS — M25472 Effusion, left ankle: Secondary | ICD-10-CM

## 2022-05-22 DIAGNOSIS — R600 Localized edema: Secondary | ICD-10-CM

## 2022-05-22 NOTE — Patient Instructions
To schedule the MRI    [facility] Sched[facility]  Phone: [phone]  Fax: [phone]  Hours of Operation:  7:00 am - 7:00 pm, Mon - Fri  [email or web address]
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
## 2022-07-03 ENCOUNTER — Ambulatory Visit: Payer: MEDICAID

## 2022-07-03 DIAGNOSIS — L918 Other hypertrophic disorders of the skin: Secondary | ICD-10-CM

## 2022-07-03 MED ORDER — FUROSEMIDE 20 MG PO TABS
20 mg | ORAL_TABLET | Freq: Every day | ORAL | 1 refills | Status: AC | PRN
Start: 2022-07-03 — End: ?

## 2022-07-03 NOTE — Progress Notes
PATIENT: Dustin Watts  MRN: [id number]  DOB: [birth year]  DATE OF SERVICE: 07/03/2022    CHIEF COMPLAINT:   Chief Complaint   Patient presents with   ? Follow-up     Foot         History of Present Illness  Dustin Watts is a 66 y.o. male presents for:     Foot edema  - chronic  - R foot  - has had seroma surgical excision in May [facility] plastic surgery  - since then the incision site has been mildly painful  - the compression socks cause much edema in the toes  - pins and needles on the foot at night    ROS  No weakness, no numbness    (click to expand/collapse)      Past Medical History:   Diagnosis Date   ? Acne    ? Arthritis    ? Cerebral palsy (HCC/RAF)    ? Eczema    ? Hypertension     ''White coat syndrome''   ? Impaired glucose tolerance    ? Low vitamin D level    ? obese    ? Seizure (HCC/RAF) 1973    chronically on Dilantin, no seizure in 25 years, followed by Neurologist (Dr. Wiliam Ke) at [facility]   ? 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)     R foot s/p surgical removal   ? Shortening of arm, congenital     right arm       Medications that the patient states to be currently taking   Medication Sig   ? acetaminophen 325 mg tablet Take 2 tablets (650 mg total) by mouth every six (6) hours as needed (mild pain).   ? ASPIRIN LOW DOSE 81 MG EC tablet TAKE 1 TABLET BY MOUTH EVERY DAY   ? benzoyl peroxide-erythromycin 5-3% gel Apply to acne once daily..   ? Calcium Carb-Cholecalciferol (OYSTER SHELL CALCIUM W/D) 500-200 MG-UNIT TABS    ? DILANTIN 100 MG ER capsule TAKE 4 CAPSULES ORALLY AT BEDTIME   ? docusate 100 mg capsule    ? ibuprofen 600 mg tablet Take 1 tablet (600 mg total) by mouth daily as needed.   ? oxyCODONE 5 mg tablet Take 1-2 tablets (5-10 mg total) by mouth every four (4) hours as needed for Moderate Pain (Pain Scale 4-6) or Severe Pain (Pain Scale 7-10). Max Daily Amount: 60 mg   ? RETIN-A MICRO PUMP 0.04 % gel Apply topically at bedtime.   ? sulfacetamide-sulfur 9-4.5 % LIQD liquid Apply to face twice daily.   ? Tretinoin Microsphere (RETIN-A MICRO PUMP) 0.06 % GEL APPLY PEA SIZED AMOUNT TOPICALLY TO THE FACE NIGHTLY.       Physical Exam  Vitals:    07/03/22 0945 07/03/22 0952   BP: 163/87 137/81   Pulse: 96    Resp: 20    Temp: 36 ?C (96.8 ?F)    TempSrc: Tympanic    SpO2: 97%    Weight: (!) 228 lb 9.6 oz (103.7 kg)    Height: 5' 6'' (1.676 m)      Wt Readings from Last 3 Encounters:   07/03/22 (!) 228 lb 9.6 oz (103.7 kg)   05/22/22 (!) 231 lb 3.2 oz (104.9 kg)   04/08/22 (!) 229 lb 9.6 oz (104.1 kg)     Body mass index is 36.9 kg/m?Marland Kitchen  System Check if normal Positive or additional negative findings   Constit  [x]  General appearance  Eyes  []  Conj/Lids []  Pupils  []  Fundi     HENMT  []  External ears/nose []  TMs (bilateral)   []  Gross Hearing []  Nasal mucosa   []  Lips/teeth/gums []  Oropharynx    []  Mucus membranes []  Head     Neck  []  Inspection/palpation []  Thyroid     Resp  [x]  Effort []  No wheezing    []  Auscultation  []  No crackles     CV  []  Rhythm/rate []  Murmurs []  LEE    []  JVP non-elevated []  Radial pulses     Breast  []  Inspection []  Palpation     GI  []  No abd masses  []  No tenderness   []  No rebound/guarding  []  Liver/spleen      GU  []  Scrotum []  Penis []  Prostate      Lymph  Normal: []  Neck []  Axillae []  Groin     MSK site examined:    []  Inspect/palp []  ROM []  Strength/tone   R foot with 3+ pitting edema distal to the surgical scar on the dorsum, mild tenderness without fluctuance of the scar area, no dehiscence, no discharge, no unusual warmth, no erythema     Skin  []  Inspection []  Palpation  Site: 4mm skin tag on the inner R thigh   Neuro  []  CN2-12 intact grossly   []  Alert and oriented   []  Muscle strength - legs     []  Sensation  []  Gait/balance     Psych  []  Insight/judgement  []  Mood/affect           Lab and Studies:  Lab Results   Component Value Date    NA 139 12/26/2021    K 4.1 12/26/2021    CL 103 12/26/2021    CO2 23 12/26/2021    BUN 9 12/26/2021    CREAT 0.54 (L) 12/26/2021    GLUCOSE 108 (H) 12/26/2021    CALCIUM 9.6 12/26/2021    ALT 18 12/26/2021    AST 35 12/26/2021    ALKPHOS 106 12/26/2021    BILITOT 0.2 12/26/2021     Lab Results   Component Value Date    WBC 4.32 03/20/2022    HGB 14.5 03/20/2022    HCT 43.4 03/20/2022    MCV 97.5 03/20/2022    PLT 198 03/20/2022     Lab Results   Component Value Date    HGBA1C 5.7 (H) 12/26/2021     Lab Results   Component Value Date    TSH 1.5 01/21/2017               Assessment and Plan       ICD-10-CM    1. Edema of right foot  R60.0 furosemide 20 mg tablet      2. Elevated MCV  R71.8       3. Acrochordon  L91.8     R leg              Problem   Edema of Right Foot    05/22/2022 - significantly worsened s/p surgical removal of a cyst/seroma on the dorsum of the R foot in May, possible change in the venous drainage of the foot after the surgery, counseled on elevation and compression sock, will monitor for improvement in the next few months  07/03/2022 - insufficient benefit with the compression sock, will re-engage plastic surgery to determine if further surgical management will help     Elevated Mcv (Resolved)    09/30/2021 -  folate and b12 reassuring  07/03/2022 - recently reassuring, will continue monitoring at annual exams         Cryotherapy to the R leg acrochordon without complication, f/u if no resolution        The above recommendation were discussed with the patient.  The patient has all questions answered satisfactorily and is in agreement with this recommended plan of care.    Return if symptoms worsen or fail to improve.     Author:  Lendon Collar. Dhyana Bastone 07/03/2022 10:40 AM  Time of note filed does not necessarily reflect the time of encounter.  Portions of this note may have been created with voice recognition software. Occasional wrong-word or ''sound-alike'' substitutions may have occurred due to the inherent limitations of voice recognition software. Please read the chart carefully and recognize, using context, where these substitutions have occurred.

## 2022-07-14 ENCOUNTER — Ambulatory Visit: Payer: MEDICAID

## 2022-07-14 NOTE — Progress Notes
Surgical Oncology Progress Note    PATIENT: Dustin Watts  MRN: [id number]  DOB: 01-21-1956  DATE OF SERVICE: 07/14/2022   CONSULTING ATTENDING: Lowella Petties, MD    Reason for Visit:     umbilical hernia    History of Present Illness:     Dustin Watts is a 66 y.o. male with history of cerebral palsy, epilepsy, and hypertension who presents today for annual followup regarding his umbilical hernia which is currently being observed. The patient initially became aware of his hernia when he presented to an OSH in 2021 for separate chief complaint, was told that he had an umbilical hernia and subsequently referred to [facility] general surgery. When initially seen in clinic in 2021 was hesitant to pursue surgical repair but wanted to be followed in case symptoms worsened. Last seen in clinic 11/30/2020.     Pt states that last August he lost his footing and sustained a right foot injury which has made it difficult to walk. As a result, he has not been able to lose much weight. Also states that he has abdominal pain when squatting at the gym so he had avoided that activity. States that he is unsure if the bulge is reducible because he has not tried to push it back in. States that he is not in constant abdominal pain, denies fevers/chills, skin changes, or tenderness to touch to abdominal bulge.     History of prediabetes, states he is a nonsmoker.     07/14/22:      Past Medical History:     Past Medical History:   Diagnosis Date   ? Acne    ? Arthritis    ? Cerebral palsy (HCC/RAF)    ? Eczema    ? Hypertension     ''White coat syndrome''   ? Impaired glucose tolerance    ? Low vitamin D level    ? obese    ? Seizure (HCC/RAF) 1973    chronically on Dilantin, no seizure in 25 years, followed by Neurologist (Dr. Wiliam Ke) at [facility]   ? 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)     R foot s/p surgical removal   ? Shortening of arm, congenital     right arm        Medications:     Current Outpatient Medications   Medication Instructions   ? acetaminophen (TYLENOL) 650 mg, Oral, Every 6 hours PRN   ? ASPIRIN LOW DOSE 81 MG EC tablet TAKE 1 TABLET BY MOUTH EVERY DAY   ? atorvastatin (LIPITOR) 10 mg, Oral, Every night at bedtime   ? benzoyl peroxide-erythromycin 5-3% gel Apply to acne once daily.   ? Calcium Carb-Cholecalciferol (OYSTER SHELL CALCIUM W/D) 500-200 MG-UNIT TABS No dose, route, or frequency recorded.   ? DILANTIN 100 MG ER capsule TAKE 4 CAPSULES ORALLY AT BEDTIME   ? docusate 100 mg capsule No dose, route, or frequency recorded.   ? furosemide (LASIX) 20 mg, Oral, Daily PRN   ? ibuprofen (ADVIL, MOTRIN) 600 mg, Oral, Daily PRN   ? oxyCODONE 5-10 mg, Oral, Every 4 hours PRN   ? RETIN-A MICRO PUMP 0.04 % gel Topical, Every night at bedtime   ? sulfacetamide-sulfur 9-4.5 % LIQD liquid Apply to face twice daily   ? Tretinoin Microsphere (RETIN-A MICRO PUMP) 0.06 % GEL APPLY PEA SIZED AMOUNT TOPICALLY TO THE FACE NIGHTLY        Review of Systems:     Negative aside from  what is documented in HPI.    Objective:     Physical Exam:    Vital Signs: There were no vitals taken for this visit.  GEN: NAD, WDWN, alert & appropriate. Obese body habitus.  HEENT: Sclerae anicteric, NCAT, EOMI.   CV: Regular rate.  RESP: Breathing unlabored on room air.  ABD: S/NT/ND, palpable masses or hernias. Reducible, soft umbilical hernia.   EXTR:  No clubbing, cyanosis or edema.   NEURO:  Grossly intact without any focal deficits.     Pathology:  FINAL DIAGNOSIS   Date Value Ref Range Status   04/08/2022   Final       A. FOOT, CYST, RIGHT (EXCISION):   - Benign cyst and fibrous tissue          Studies:  No images are attached to the encounter.     Labs:  Lab Results   Component Value Date    WBC 4.32 03/20/2022    WBC 6.44 02/14/2009    HGB 14.5 03/20/2022    HGB 14.0 02/14/2009    HCT 43.4 03/20/2022    HCT 41.7 02/14/2009    PLT 198 03/20/2022    PLT 183 02/14/2009    BUN 9 12/26/2021    BUN 13 04/18/2014    BUN 10 02/14/2009    CREAT 0.54 (L) 12/26/2021    CREAT 0.7 02/14/2009    NA 139 12/26/2021    NA 141 02/14/2009    K 4.1 12/26/2021    K 4.3 02/14/2009    CHOL 258 09/30/2021    CHOLHDL 52 09/30/2021    NOHDLCHOCAL 206 (H) 09/30/2021    TRIGLY 193 (H) 09/30/2021    ALT 18 12/26/2021    ALT 20 02/14/2009    AST 35 12/26/2021    AST 21 02/14/2009    HGBA1C 5.7 (H) 12/26/2021    HGBA1C 5.6 04/23/2008    TSH 1.5 01/21/2017    TSH 0.87 04/23/2008    INR 1.1 05/27/2017    INR 1.0 03/23/2005       Assessment:     Dustin Watts is a 66 y.o. male with history of cerebral palsy, epilepsy, and hypertension who presents today for annual followup regarding his umbilical hernia which is currently being observed. The patient initially became aware of his hernia when he presented to an OSH in 2021 for separate chief complaint, was told that he had an umbilical hernia and subsequently referred to [facility] general surgery. When initially seen in clinic in 2021 was hesitant to pursue surgical repair but wanted to be followed in case symptoms worsened. Last seen in clinic 11/30/2020.     Pt history and clinical exam consistent with reducible, umbilical hernia. Pt notably has gained weight (BMI increased from 30 to 36 from last year's visit). As hernia repair would be elective, recommend patient loses weight (goal of 200lb) before proceeding with further surgical planning.     Plan/Recommendations:     - recommend weight loss (200lb goal weight)  - will consider repair of umbilical hernia after patient obtains goal weight  - will schedule followup appointment in 6 months  - return precautions given    DWA Dr. Mordecai Maes.     Author:  Bland Span, MD PGY-1  Department of General Surgery  Surgical Oncology (C Service)  [phone]

## 2022-07-25 MED ORDER — RETIN-A MICRO PUMP 0.06 % EX GEL
0 refills
Start: 2022-07-25 — End: ?

## 2022-07-27 ENCOUNTER — Telehealth: Payer: MEDICAID

## 2022-07-27 NOTE — Telephone Encounter
Refill request sent to MD.

## 2022-07-27 NOTE — Telephone Encounter
Call Back Request      Reason for call back:  Pt would like to follow up on his prescription refill request for the retin a micro pump 0.04% gel.    Any Symptoms:  []  Yes  [x]  No       If yes, what symptoms are you experiencing:    o Duration of symptoms (how long):    o Have you taken medication for symptoms (OTC or Rx):      If call was taken outside of clinic hours:    [] Patient or caller has been notified that this message was sent outside of normal clinic hours.     [] Patient or caller has been warm transferred to the physician's answering service. If applicable, patient or caller informed to please call us back if symptoms progress.  Patient or caller has been notified of the turnaround time of 1-2 business day(s).

## 2022-07-28 ENCOUNTER — Inpatient Hospital Stay: Payer: MEDICAID

## 2022-07-28 DIAGNOSIS — M25472 Effusion, left ankle: Secondary | ICD-10-CM

## 2022-07-28 MED ORDER — FUROSEMIDE 20 MG PO TABS
20 mg | ORAL_TABLET | Freq: Every day | ORAL | 1 refills | PRN
Start: 2022-07-28 — End: ?

## 2022-07-28 MED ORDER — RETIN-A MICRO PUMP 0.06 % EX GEL
6 refills | Status: AC
Start: 2022-07-28 — End: ?

## 2022-08-07 ENCOUNTER — Ambulatory Visit: Payer: MEDICAID

## 2022-08-07 DIAGNOSIS — Z125 Encounter for screening for malignant neoplasm of prostate: Secondary | ICD-10-CM

## 2022-08-07 DIAGNOSIS — Z23 Encounter for immunization: Secondary | ICD-10-CM

## 2022-08-07 DIAGNOSIS — L700 Acne vulgaris: Secondary | ICD-10-CM

## 2022-08-07 MED ORDER — FUROSEMIDE 20 MG PO TABS
20 mg | ORAL_TABLET | Freq: Every day | ORAL | 1 refills | Status: AC
Start: 2022-08-07 — End: ?

## 2022-08-07 NOTE — Patient Instructions
Follow up with the surgeon for your persistent swelling in the right foot.    Please see a new orthopedic doctor for the swelling in the left ankle and you can discuss the recent MRI findings with them.    MRI ankle left  IMPRESSION: 1.  Extensor digitorum longus tenosynovitis, which corresponds to the symptom marker. Subcutaneous edema is also present about the lateral foot and ankle. 2.  Confluent signal abnormality at the sinus tarsi, suggestive of scarring versus synovitis. Accompanying mild fluid distention of the Gruberi bursa at the anterolateral ankle. 3.  Abnormal morphology of the peroneus brevis tendon, worrisome for partial split tear. 4.  Mild multifocal muscle edema, of uncertain etiology. I, Gita Kudo, M.D., have reviewed this radiological study personally and I am in full agreement with the findings of the report presented here. Dictated by: Dorthy Cooler   07/28/2022 2:51 PM Signed by: Gita Kudo   07/28/2022 5:05 PM     Continue the diuretic (water pill) to reduce the swelling
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
## 2022-08-10 ENCOUNTER — Telehealth: Payer: MEDICAID

## 2022-08-10 NOTE — Telephone Encounter
Appointment Accommodation Request    Appointment Type:New     Reason for sooner request: water in Knee    Date/Time Requested (If any): open     Last seen by MD:2019    Any Symptoms:  []  Yes  [x]  No       If yes, what symptoms are you experiencing:   o Duration of symptoms (how long):     Patient or caller was offered an appointment but declined.    Patient or caller was advised to seek emergency services if conditions are urgent or emergent.    Patient or caller has been notified of the turnaround time of 1-2 business (days).

## 2022-08-11 ENCOUNTER — Telehealth: Payer: BLUE CROSS/BLUE SHIELD

## 2022-08-13 NOTE — Telephone Encounter
I left a voicemail for patient advising to call back for scheduling. Patient can be scheduled next available and placed on the wait list.

## 2022-09-17 ENCOUNTER — Ambulatory Visit: Payer: MEDICAID

## 2022-09-17 DIAGNOSIS — L280 Lichen simplex chronicus: Secondary | ICD-10-CM

## 2022-09-17 DIAGNOSIS — L700 Acne vulgaris: Secondary | ICD-10-CM

## 2022-09-17 MED ORDER — RETIN-A MICRO PUMP 0.06 % EX GEL
11 refills | Status: AC
Start: 2022-09-17 — End: ?

## 2022-09-17 MED ORDER — SULFACETAMIDE SODIUM-SULFUR 9-4.5 % EX LIQD
11 refills | Status: AC
Start: 2022-09-17 — End: ?

## 2022-09-17 MED ORDER — BENZOYL PEROXIDE-ERYTHROMYCIN 5-3 % EX GEL
11 refills | Status: AC
Start: 2022-09-17 — End: ?

## 2022-09-17 NOTE — Progress Notes
DERMATOLOGY ATTENDING ESTABLISHED PATIENT NOTE    PATIENT: Dustin Watts   MRN: [id number]   DOB: 1956-09-04   DATE OF SERVICE: 09/17/2022     CHIEF COMPLAINT:   Chief Complaint   Patient presents with    Acne        Visit start time: 9:38 AM     Dustin Watts is a 66 y.o. who presents for follow up acne and lichen simplex chronicus.    Last seen:   Last Visits with Me       Date Department Visit Type Primary Dx    07/11/2021 [facility] [place] Office Visit Acne vulgaris    10/10/2020 [facility] [place] Office Visit Acne vulgaris    10/19/2019 [facility] [place] Office Visit Acne vulgaris    01/12/2019 [facility] [place] Office Visit Acne vulgaris    08/18/2017 [facility] [place] Office Visit Acne vulgaris          Needs refills; retin-a micro pump 0.06% is what works for him.   Sulfacetamide-sulfur wash and benzamycin gel work for him also (no issue with sulfacetamide despite documented sulfa allergy)    Has been breaking out more on forehead. Uses benzamycin gel only prn.     Has had several health issues this past year. Started on furosemide and wondering if it might be drying his skin out.     Had     PMH/PSH/etc.  (click to expand/collapse)   PMH:   Past Medical History:   Diagnosis Date    Acne     Arthritis     Cerebral palsy (HCC/RAF)     Eczema     Hypertension     ''White coat syndrome''    Impaired glucose tolerance     Low vitamin D level     obese     Seizure (HCC/RAF) 1973    chronically on Dilantin, no seizure in 25 years, followed by Neurologist (Dr. Wiliam Ke) at [facility]    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)     R foot s/p surgical removal    Shortening of arm, congenital     right arm     PSH:   Past Surgical History:   Procedure Laterality Date    rectal abscess  2010    surgery [facility] at [facility]        Outpatient Medications Marked as Taking for the 09/17/22 encounter (Office Visit) with Arbutus Ped, MD   Medication    acetaminophen 325 mg tablet    ASPIRIN LOW DOSE 81 MG EC tablet    benzoyl peroxide-erythromycin 5-3% gel    Calcium Carb-Cholecalciferol (OYSTER SHELL CALCIUM W/D) 500-200 MG-UNIT TABS    DILANTIN 100 MG ER capsule    docusate 100 mg capsule    furosemide 20 mg tablet    ibuprofen 600 mg tablet    oxyCODONE 5 mg tablet    RETIN-A MICRO PUMP 0.04 % gel    sulfacetamide-sulfur 9-4.5 % LIQD liquid    Tretinoin Microsphere (RETIN-A MICRO PUMP) 0.06 % GEL       Allergies   Allergen Reactions    Topiramate      Eye swelling    Sulfa Antibiotics Angioedema     No anaphylaxis    Amlodipine Other (See Comments)     Lip swelling without anaphylaxis    Carvedilol Angioedema  Lip swelling without anaphylaxis    Losartan Rash     No hive, no anaphylaxis, unclear if truly has rash from this medicine    Penicillins Rash     ''bumps on body''            Objective (click to expand/collapse)      There were no vitals taken for this visit.    General: well-appearing, in no acute distress    Skin exam:  Total body skin exam performed as listed below.  Head/face: examined  All areas examined were within normal limits with the following exceptions:     Left Lower Cutaneous Lip, Mid Forehead  Erythematous papules on forehead - subtle, few on other areas. Mild erythema of cheeks    Mid Lower Cutaneous Lip  No lichenified plaques today             Assessment & Plan      1. Acne vulgaris  Mid Forehead; Left Lower Cutaneous Lip    Chronic condition, flaring some.   - may be stress, changes in hormones, or new medication  - recommend using benzamycin gel daily instead of only as needed for particular lesions.     Continue others. Printed prescriptions per patient preference.    Related Medications  sulfacetamide-sulfur 9-4.5 % LIQD liquid  Apply to face twice daily.    benzoyl peroxide-erythromycin 5-3% gel  Apply to acne once daily..    Tretinoin Microsphere (RETIN-A MICRO PUMP) 0.06 % GEL  APPLY A PEA SIZED AMOUNT TOPICALLY TO AFFECTED AREA OF FACE NIGHTLY.    2. Lichen simplex chronicus  Mid Lower Cutaneous Lip    Chronic condition, appears to be controlled        Assessment and plan were discussed with the patient.   Common risks, benefits, alternatives of medications and interventions was discussed and the patient expressed understanding.   Where appropriate, printed information was provided in the after visit summary.    Counseled patient regarding details of diagnosis and prognosis.    F/u 1 year or sooner prn          Author: Arbutus Ped, MD 09/17/2022 9:38 AM     Future Appointments         Provider Department Visit Type    09/24/2022 11:15 AM Allena Earing, Suszanne Conners., MD [facility] RETURN    10/05/2022 11:00 AM Bretta Bang., MD [facility] NEW    10/09/2022 10:00 AM Samras, Lendon Collar., MD, MPH [facility] RETURN    11/30/2022 10:00 AM Liana Gerold, MD, MS [facility] NEW    01/05/2023 12:30 PM Lowella Petties., MD [facility] RETURN    09/23/2023 9:30 AM Arbutus Ped, MD [facility] RETURN

## 2022-09-18 NOTE — Patient Instructions
-------------------------------------------------------    MyChart messages should be reserved for non-urgent and BRIEF questions relevant to issues covered during your visit. For urgent messages, please call ([phone]). For issues not discussed during your visit, please make an appointment - you can request an urgent appointment or may have to see someone in another office. These messages are only received during business hours and delayed responses may be expected. For extensive/multiple questions taking more than 5 minutes of your physicians time, your insurance will be billed for this time and you may be responsible for a portion of it.    For prescription refills, please check with your pharmacy for available refills prior to messaging. Also, prior authorizations may be required for many prescriptions. Please follow up with the clinic and pharmacy and insurance company for help with this. For some of these medications, using goodrx (goodrx.com or download the app), may give you the best deal.

## 2022-09-24 ENCOUNTER — Ambulatory Visit: Payer: MEDICAID

## 2022-09-24 DIAGNOSIS — T792XXA Traumatic secondary and recurrent hemorrhage and seroma, initial encounter: Secondary | ICD-10-CM

## 2022-09-24 NOTE — Progress Notes
PLASTIC SURGERY POSTOPERATIVE NOTE    Patient: Dustin Watts  MRN: [id number]  DOB: 1956/05/26  Date of Service: 09/24/2022    Date of Surgery:  04/08/2022  Surgery: Right foot cyst excision    History of Present Illness: Dustin Watts is a 66 y.o. male who is status post above.  Benign cyst.  Indicates he had some recent ''inflammation'' and was given Lasix.  He reports that he does not have any current issues with the foot or scar.     Physical Exam:  BP 148/95  ~ Pulse 98  ~ Temp 37 C (98.6 F) (Tympanic)  ~ Resp 16  ~ Ht 1.829 m (6')  ~ SpO2 96%  ~ BMI 30.76 kg/m   General: WD WN NAD, appears stated age.  Neuro: Grossly normal.  Resp: Breathing well on room air.  CV: RRR.  Right foot scar well healed.  No fluid collections.  No recurrence of cyst.  No inflammation.  No hypertrophic scars.  No signs of infection.  Nontender.     Recommendations:  Dustin Watts is a 66 y.o. male who is status post above.  No apparent issues  Follow up as needed.    I spent a total of 20 minutes, >50% of the total spend on patient counseling and coordination of care.     Author:    Berneice Watts [phone])

## 2022-10-02 MED ORDER — RETIN-A MICRO PUMP 0.06 % EX GEL
0 refills
Start: 2022-10-02 — End: ?

## 2022-10-05 ENCOUNTER — Inpatient Hospital Stay: Payer: BLUE CROSS/BLUE SHIELD

## 2022-10-05 ENCOUNTER — Ambulatory Visit: Payer: MEDICAID

## 2022-10-05 DIAGNOSIS — M65872 Other synovitis and tenosynovitis, left ankle and foot: Secondary | ICD-10-CM

## 2022-10-05 DIAGNOSIS — R52 Pain, unspecified: Secondary | ICD-10-CM

## 2022-10-05 DIAGNOSIS — M7989 Other specified soft tissue disorders: Secondary | ICD-10-CM

## 2022-10-05 DIAGNOSIS — M25472 Effusion, left ankle: Secondary | ICD-10-CM

## 2022-10-05 MED ORDER — DICLOFENAC SODIUM 1 % EX GEL
2 g | Freq: Four times a day (QID) | TOPICAL | 2 refills | Status: AC | PRN
Start: 2022-10-05 — End: ?
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
## 2022-10-05 NOTE — Patient Instructions
Thank you for attending your appointment today with Dr. Hyacinth Meeker in the [facility] Division of Sports Medicine.  As we continue your medical care, we would like to share a few helpful resources with regard to your ongoing care coordination and follow-up appointments:     Compression socks for the swelling in the lower extremities   Start physical therapy for your ankle      Imaging (X-Ray, MRI, Ultrasound): You can schedule any needed imaging directly with the [facility] Radiology Department by calling [phone].  Here is the link to access information regarding the various radiology locations within the [facility] -- [email or web address].  Typically results are reviewed during a telemedicine virtual visit -- once you have scheduled your imaging study please call the office or send a MyChart message to schedule a video visit for approximately 1 week after your imaging appointment.     Nerve Conduction & EMG Studies: You can schedule any needed nerve testing directly with the [facility] Physical Medicine Division by calling [phone].    Physical Therapy: If you have questions regarding your physical therapy prescription or need your physical therapy extended, please send a message via the MyChart secure messaging system so that we can renew or adjust your prescription accordingly. Please include the name of your physical therapy location.  A list of PT locations can also be provided, please make sure that they take your insurance.    Medication Refills: If you need a medication refill, please send a message via the MyChart secure messaging system so that we can refill your prescription appropriately.    Medication Authorization: If you have questions about insurance authorization for viscosupplementation (Monovisc, Durolane, SynviscOne, Orthovisc, Euflexxa, Supartz, etc.) or long-acting triamcinolone (Zilretta), please send a message via the MyChart secure messaging system and my assistant or myself will send you an update as soon as possible.      Medical Questions: If you have additional questions about your injury, diagnosis, or treatment plan, you can either send a secure message via MyChart or schedule a video visit follow-up to discuss your questions in detail.  Please keep in mind that MyChart messages are not monitored after hours or over the weekend and are for non-urgent issues.  Depending on the complexity of the question, a follow up visit (in person or video) may be recommended.  If you have not received a response from your MyChart message within a week please call the office, we do our best to address them within 1-2 business days.      Follow-Up Visits: We try to prioritize telemedicine virtual follow-up visits when possible to minimize your time away from home & work as well as the hassle of [place] traffic and parking on campus.  To schedule a follow-up visit, please send me a message via MyChart or call my patient coordinator Gwenevere Ghazi at [phone] who will schedule you for a video visit follow-up.  Note that some issues may specifically require an in-person follow-up visit (e.g. procedures, ultrasounds, etc.).    Clinical Availability: Dr. Hyacinth Meeker works in the Division of Sports Medicine in our [place] clinic on Mondays, [facility] clinic on every other Thursday mornings and in our telehealth clinic every Thursday afternoon.  In addition to her clinical work in the Division of Sports Medicine, she spends time working in the [employer] for Children, the [employer] of Family Medicine, and within [employer] the remainder of the week.     New Injuries or Issues: New musculoskeletal  injuries are unpredictable and will inevitable arise.  To discuss a new injury or issue, please send me a message via MyChart or call my patient coordinator Gwenevere Ghazi at [phone] who will schedule you for the appropriate follow-up visit.        Dustin Watts. Hyacinth Meeker, MD Division of Sports Medicine & Non-Operative Orthopedics  Department of Orthopaedic Surgery  [facility]    ------------------------------------------------------------------------------------        [facility]   Ph: [phone] ~ Fax: [phone]  [facility]: [address] [place], [address] ~ Ph: [phone]  [facility]: [address] ~ Ph: [phone]  [facility]: [address] ~ Ph: [phone]  [facility]: [address] [place] ~ Ph: [phone] ~ [phone]    [facility]   [email or web address]  [facility]: [address], [place], [place], [address]  Ph: [phone] ~ Fax: [phone]  [facility]: [address], [address], [place], [place] [address]  Ph: [phone] ~ Fax: [phone]  [place]: [address] [place], [address]   Ph: [phone] ~ Fax: [phone]  In network with [facility], BCBS, Medicare; (Peds over 11)  [facility]: [address], [place], [place] [address]  Ph: [phone] ~ Fax [phone]    [facility] Travel to you PT, possible waitlist  [email or web address]  Ph: [phone] ~ Fax: [phone]

## 2022-10-08 ENCOUNTER — Telehealth: Payer: MEDICAID

## 2022-10-08 MED ORDER — RETIN-A MICRO PUMP 0.06 % EX GEL
0 refills
Start: 2022-10-08 — End: ?

## 2022-10-09 ENCOUNTER — Ambulatory Visit: Payer: MEDICAID

## 2022-10-09 DIAGNOSIS — M170 Bilateral primary osteoarthritis of knee: Secondary | ICD-10-CM

## 2022-10-09 DIAGNOSIS — R7989 Other specified abnormal findings of blood chemistry: Secondary | ICD-10-CM

## 2022-10-09 DIAGNOSIS — Z125 Encounter for screening for malignant neoplasm of prostate: Secondary | ICD-10-CM

## 2022-10-09 NOTE — Telephone Encounter
Call Back Request      Reason for call back: Pt called to schedule his EMG. Per last office notes with Dr. Hyacinth Meeker Pt should get an EMG but there's no referral for [place] to schedule. Pt requesting referral be placed and call back, once complete.     CBN [phone]    Any Symptoms:  []  Yes  [x]  No       If yes, what symptoms are you experiencing:    o Duration of symptoms (how long):    o Have you taken medication for symptoms (OTC or Rx):      If call was taken outside of clinic hours:    [] Patient or caller has been notified that this message was sent outside of normal clinic hours.     [] Patient or caller has been warm transferred to the physician's answering service. If applicable, patient or caller informed to please call back if symptoms progress.  Patient or caller has been notified of the turnaround time of 1-2 business day(s).

## 2022-10-09 NOTE — Patient Instructions
For the swelling in the left leg, we are checking labs to make sure there is no major problem in the blood vessels, and please continue with the following  Compression stockings regularly  Elevate you leg as much as possible  Avoid salt in your diet and avoid eating too much food from restaurants as this also has much salt  Regular exercise is good with a bike or similar  Continue the furosemide (Lasix) 20mg  once a day.  You can increase the furosemide to twice a day if needed, and please follow up for repeat labs in 1 month.

## 2022-10-09 NOTE — Progress Notes
PATIENT: Dustin Watts  MRN: [id number]  DOB: 09/17/1956  DATE OF SERVICE: 10/09/2022    CHIEF COMPLAINT:   Chief Complaint   Patient presents with   ? Follow-up        History of Present Illness  Dustin Watts is a 66 y.o. male presents for:     Seroma  - dorsum of R foot  - s/p excision  - seeing podiatry  - told that it would take a year for the edema to fully resolve  - has been bike riding recently    Edema  - chronic L leg  - has seen ortho (see below)  - recently worsened after bike riding  - pruritic at times  - has been taking furosemide but not last night  - also used diclofenac topically for antiinflammatory but patient said that is worsened the edema  - history of OA in the knee, seeing ortho    Assessment from ortho 11/27  Dustin Watts is a 66 y.o. male who presents with chronic L ankle swelling. History and examination are concerning for a non-musculoskeletal cause for lower extremity swelling, such as venous insufficiency. Low likelihood for acute DVT given the intermittent nature of swelling, lack of pain/erythema, warmth but will rule out with DVT US. Will trial topical diclofenac for anti-inflammatory properties, physical therapy for strengthening and proprioception, and compression socks. Will advise follow-up with primary care provider.    ROS  No fever, no cough, no SP    (click to expand/collapse)      Past Medical History:   Diagnosis Date   ? Acne    ? Arthritis    ? Cerebral palsy (HCC/RAF)    ? Eczema    ? Hypertension     ''White coat syndrome''   ? Impaired glucose tolerance    ? Low vitamin D level    ? obese    ? Seizure (HCC/RAF) 1973    chronically on Dilantin, no seizure in 25 years, followed by Neurologist (Dr. Wiliam Ke) at [facility]   ? 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)     R foot s/p surgical removal   ? Shortening of arm, congenital     right arm       Medications that the patient states to be currently taking   Medication Sig   ? diclofenac Sodium (VOLTAREN) 1% gel APPLY 2 GRAMS TO THE AFFECTED AREA(S) BY TOPICAL ROUTE 4 TIMES PER DAY       Physical Exam  Vitals:    10/09/22 1003   BP: 150/95   Pulse: 85   Resp: 16   SpO2: 96%   Weight: (!) 225 lb (102.1 kg)     Wt Readings from Last 3 Encounters:   10/09/22 (!) 225 lb (102.1 kg)   08/07/22 (!) 226 lb 12.8 oz (102.9 kg)   07/03/22 (!) 228 lb 9.6 oz (103.7 kg)     Body mass index is 30.52 kg/m?Marland Kitchen  System Check if normal Positive or additional negative findings   Constit  [x]  General appearance     Eyes  []  Conj/Lids []  Pupils  []  Fundi     HENMT  []  External ears/nose []  TMs (bilateral)   []  Gross Hearing []  Nasal mucosa   []  Lips/teeth/gums []  Oropharynx    []  Mucus membranes []  Head     Neck  []  Inspection/palpation []  Thyroid     Resp  [x]  Effort []  No wheezing    []   Auscultation  []  No crackles     CV  []  Rhythm/rate []  Murmurs []  LEE    []  JVP non-elevated []  Radial pulses     Breast  []  Inspection []  Palpation     GI  []  No abd masses  []  No tenderness   []  No rebound/guarding  []  Liver/spleen      GU  []  Scrotum []  Penis []  Prostate      Lymph  Normal: []  Neck []  Axillae []  Groin     MSK site examined:    []  Inspect/palp []  ROM []  Strength/tone   3+ pitting edema of L ankle  Has 2+ pitting edema on the L shin  No tenderness on the L knee, no significant edema noted  R dorsum without tenderness; surgical scar noted   Skin  []  Inspection []  Palpation  Site:     Neuro  []  CN2-12 intact grossly   []  Alert and oriented   []  Muscle strength - legs     []  Sensation  []  Gait/balance     Psych  []  Insight/judgement  []  Mood/affect           Lab and Studies:  Lab Results   Component Value Date    NA 139 12/26/2021    K 4.1 12/26/2021    CL 103 12/26/2021    CO2 23 12/26/2021    BUN 9 12/26/2021    CREAT 0.54 (L) 12/26/2021    GLUCOSE 108 (H) 12/26/2021    CALCIUM 9.6 12/26/2021    ALT 18 12/26/2021    AST 35 12/26/2021    ALKPHOS 106 12/26/2021    BILITOT 0.2 12/26/2021     Lab Results   Component Value Date    WBC 4.32 03/20/2022 HGB 14.5 03/20/2022    HCT 43.4 03/20/2022    MCV 97.5 03/20/2022    PLT 198 03/20/2022     Lab Results   Component Value Date    HGBA1C 5.7 (H) 12/26/2021     Lab Results   Component Value Date    TSH 1.5 01/21/2017     Lab Results   Component Value Date    ALBCREATUR  06/08/2019      Comment:      Unable to calculate. Test result is?below detection?limit.         MR ANKLE WO CONTRAST LEFT (07/28/22):   IMPRESSION:   1. ?Extensor digitorum longus tenosynovitis, which corresponds to the symptom marker. Subcutaneous edema is also present about the lateral foot and ankle.  2. ?Confluent signal abnormality at the sinus tarsi, suggestive of scarring versus synovitis. Accompanying mild fluid distention of the Gruberi bursa at the anterolateral ankle.   3. ?Abnormal morphology of the peroneus brevis tendon, worrisome for partial split tear.  4. ?Mild multifocal muscle edema, of uncertain etiology.          Assessment and Plan       ICD-10-CM    1. Edema of left ankle  M25.472 D-Dimer     Albumin/Creat Ratio Ur     Comprehensive Metabolic Panel      2. Prediabetes  R73.02 Hgb A1c      3. Edema of right foot  R60.0       4. Primary osteoarthritis of both knees  M17.0       5. Screening for prostate cancer  Z12.5 PSA,Screening              Problem   Edema of Right Foot  05/22/2022 - significantly worsened s/p surgical removal of a cyst/seroma on the dorsum of the R foot in May, possible change in the venous drainage of the foot after the surgery, counseled on elevation and compression sock, will monitor for improvement in the next few months  07/03/2022 - insufficient benefit with the compression sock, will re-engage plastic surgery to determine if further surgical management will help  08/07/2022 - mild improvement with furosemide, refilled today, again advised f/u with surgery  10/09/2022 - improvement, expect significant resolution in the next year     Prediabetes    Chronic, relatively stable  04/14/2019- 5.8, counseled on exercise including continue biking  01/03/2021 - 6.0, counseled on healthy foods  12/26/2021 - recently improving, counseled on diet/exercise  10/09/2022 - recheck today     Osteoarthritis of Both Knees    03/19/2016 - followed by rheum, doing well on ibuprofen prn and knee brace  05/07/2016 - counseled to take ibuprofen infrequently to avoid side effects including worsening of the BP  10/22/2016 - infrequent use of ibuprofen (a couple times a week), counseled to maintain low frequency of use because of HTN  01/21/2017 - no current pain, will only take ibuprofen infrequently, no role for imaging or PT at this time.  Counseled on weight loss.  07/01/2017- mild pain, infrequent Tylenol, no NSAIDs, no change in this current management and encouraged to increase exercise regimen.  09/29/2018 - chronic, stable, f/u with Dr Myrtice Lauth (in this office) or Rheumatologist for arthrocentesis if fluid recurs  10/09/2022 - recent edema and pain after biking, will f/u with ortho for drainage prn           Assessment and Plan per patient instructions:  For the swelling in the left leg, we are checking labs to make sure there is no major problem in the blood vessels, and please continue with the following  1. Compression stockings regularly  2. Elevate you leg as much as possible  3. Avoid salt in your diet and avoid eating too much food from restaurants as this also has much salt  4. Regular exercise is good with a bike or similar  5. Continue the furosemide (Lasix) 20mg  once a day.  You can increase the furosemide to twice a day if needed, and please follow up for repeat labs in 1 month.          The above recommendation were discussed with the patient.  The patient has all questions answered satisfactorily and is in agreement with this recommended plan of care.    No follow-ups on file.     Author:  Lendon Collar. Rakim Moone 10/09/2022 10:39 AM  Time of note filed does not necessarily reflect the time of encounter.  Portions of this note may have been created with voice recognition software. Occasional wrong-word or ''sound-alike'' substitutions may have occurred due to the inherent limitations of voice recognition software. Please read the chart carefully and recognize, using context, where these substitutions have occurred.

## 2022-10-10 LAB — D-Dimer: D-DIMER STAGO: 0.51 ug{FEU}/mL (ref <0.60)

## 2022-10-10 LAB — Comprehensive Metabolic Panel
BILIRUBIN,TOTAL: 0.2 mg/dL (ref 0.1–1.2)
CALCIUM: 9.7 mg/dL (ref 8.6–10.4)

## 2022-10-10 LAB — Vitamin D,25-Hydroxy: VITAMIN D,25-HYDROXY: 35 ng/mL (ref 20–50)

## 2022-10-10 LAB — Hgb A1c: HGB A1C: 6 — ABNORMAL HIGH (ref <5.7)

## 2022-10-10 LAB — PSA,Screening: PSA,SCREENING: 0.68 ng/mL (ref 0–4.5)

## 2022-10-13 ENCOUNTER — Inpatient Hospital Stay: Payer: BLUE CROSS/BLUE SHIELD

## 2022-10-13 DIAGNOSIS — M7989 Other specified soft tissue disorders: Secondary | ICD-10-CM

## 2022-10-29 MED ORDER — DICLOFENAC SODIUM 1 % EX GEL
2 g | Freq: Four times a day (QID) | TOPICAL | 1 refills | PRN
Start: 2022-10-29 — End: ?

## 2022-11-26 NOTE — Progress Notes
RHEUMATOLOGY OUTPATIENT CONSULTATION  PATIENT: Dustin Watts MRN: [id number] DOB: 01-16-1956  REASONS FOR VISIT/CHIEF COMPLAINT:OA      HPI:66 y.o. male former pt of Dr Roselee Nova with generalized OA, OK knees here to establish care.  Hx of epilepsy, pre-diabetic, HTN, HLD, vit D deficiency      Current visit patient reports the followings:-     11/28/2022 visit - Patient has not been seen since 10/2019  Larey Seat twice during the pandemic, the most recent 2023 / Pain in both knees, worse in L / Other joints (hands and wrists are ok)  03/2022 - Foot cyst removal   Hx of seizure diagnosis, but free from seizures for long time       10/25/2019  - doing well ibuprofen may be 3 times a week left knee bothers walking not bad ''ibuprofen before doing a lot of walking'' no stomach issue aware not to take daily  - lost about 3 lbs doing squat daily 200s and bike      07/2019  - staying home due to covid  - L knee is not bad; occasional small pain with walking ''do I have fluid in my knee''  - Ibuprofen 800 mg; tried not to take too many; 4-5 times a month or so could be more e.g before going out/walking    03/2019  - L knee doing OK; last injection pain after the procedure lasted 1.5 month; now fine; walking fine  - L ankle strain earlier this year not recall splint  - Ibuprofen 800 mg prn BID but not need them; taking oxycodone as needed on average once at night for the chronic LBP    09/2018  - overall good no falls; L knee some swelling and worsening pain wondering if take fluid out or if any fluid inside  - ibuprofen as needed 800 mg 2 times a day only prn for years no problem with food no indigestion  - otherwise doing well      11/2017   -last visit increasing knee pain XR R knee good L knee mild OA   - doing well; no seizure any longer  - joints are not bad; no pain with walking; no need to take ibuprofen regularly; takes infrequently- a day before and on day out [place] going [place]; requesting refill; no problem no GERD/taking with food    08/2017  -last visit 02/2017 joints are doing fine on prn NSAID/post CVA  -knees are doing not too badly ''only after long walk'' ''described as blown up'', keen to have refill ibuprofen 800 mg PRN given by Dr Roselee Nova in past ''I only take it if I have a long walk to do so taking infrequently''; no stiffness; at times puffy not today; slight worse lately with pain   -other joints are fine  -chronic R sided atrophy since borne Dx cerebral palsy otherwise in good health  -no seizure for some time now      No known history of malar rash, photosensitivity rash, discoid lesions, oral/nasal ulcers,  pleuritic chest pain, seizures/psychosis, Raynaud's, skin tightness, dry eyes/mouth, hair loss or blood clots.  No history of muscle pain or proximal myopathy.  No history for fever, weight loss, loss of appetite, night sweat or lymphadenopathy.  No history of skin psoriasis, eye issues, STDs or IBD.    No recent travelling.  No sick contacts.    Prior medication/Joint injections/Immune suppressant therapy:  None    ROS:  A 14 point ROS was  obtained and significant for what is mentioned in HPI and below.    ROS    (Check if Normal, or note + findings):   []    reviewed questionnaire on 11/30/2022    []   Not Obtainable :   [x]    Constit : [-] fever, [-] weight loss    [x]    Skin :[-] vasculitic palpable rash, [-] tender nodules, [-]scaling plaques      [x]    Eyes : [-] photophobia, [-] red eye [-] gritty eyes    [x]    CV :[-] LE swelling, orthopnea, [-] PND,    []    MS :     [x]    ENT: [-] ear /nose swelling, [-] throat pain or post nasal d/c   [x]    GI :[-] constipation [-] diarrhea  [-] melena [-]heartburn,   [x]    Heme/Lymph :[-] easy bruising or bleeding      [x]   Resp : [-] DOE, [-] wheezing [-] pleurisy   [x]    GU:[-]dysruria, [-] gross hematuria   []    Neuro :[-] focal neurological [-] wrist or foot drop     [x]    Imm/All :[-] recurrent infections in past, [-] pollen or seasonal allergy   [x]   Endo : [x]    Psych: [-] mood, [-] memory     PMHx:  Past Medical History:   Diagnosis Date    Acne     Arthritis     Cerebral palsy (HCC/RAF)     Eczema     Hypertension     ''White coat syndrome''    Impaired glucose tolerance     Low vitamin D level     obese     Seizure (HCC/RAF) 1973    chronically on Dilantin, no seizure in 25 years, followed by Neurologist (Dr. Wiliam Ke) at [facility]    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)     R foot s/p surgical removal    Shortening of arm, congenital     right arm       PSHx:  Past Surgical History:   Procedure Laterality Date    rectal abscess  2010    surgery done at [facility]       Meds:    Medications that the patient states to be currently taking   Medication Sig    acetaminophen 325 mg tablet Take 2 tablets (650 mg total) by mouth every six (6) hours as needed (mild pain).    ASPIRIN LOW DOSE 81 MG EC tablet TAKE 1 TABLET BY MOUTH EVERY DAY    benzoyl peroxide-erythromycin 5-3% gel Apply to acne once daily..    Calcium Carb-Cholecalciferol (OYSTER SHELL CALCIUM W/D) 500-200 MG-UNIT TABS     diclofenac Sodium 1% gel Apply 2 g topically four (4) times daily as needed.    DILANTIN 100 MG ER capsule TAKE 4 CAPSULES ORALLY AT BEDTIME    docusate 100 mg capsule     furosemide 20 mg tablet Take 1 tablet (20 mg total) by mouth daily.    ibuprofen 600 mg tablet Take 1 tablet (600 mg total) by mouth daily as needed.    oxyCODONE 5 mg tablet Take 1-2 tablets (5-10 mg total) by mouth every four (4) hours as needed for Moderate Pain (Pain Scale 4-6) or Severe Pain (Pain Scale 7-10). Max Daily Amount: 60 mg    RETIN-A MICRO PUMP 0.04 % gel Apply topically at bedtime.    sulfacetamide-sulfur 9-4.5 % LIQD liquid Apply to  face twice daily.    Tretinoin Microsphere (RETIN-A MICRO PUMP) 0.06 % GEL APPLY A PEA SIZED AMOUNT TOPICALLY TO AFFECTED AREA OF FACE NIGHTLY.       Allergies:  Allergies   Allergen Reactions    Topiramate      Eye swelling    Sulfa Antibiotics Angioedema     No anaphylaxis    Amlodipine Other (See Comments)     Lip swelling without anaphylaxis    Carvedilol Angioedema     Lip swelling without anaphylaxis    Losartan Rash     No hive, no anaphylaxis, unclear if truly has rash from this medicine    Penicillins Rash     ''bumps on body''       SocHx:  Social History     Tobacco Use    Smoking status: Never     Passive exposure: Never    Smokeless tobacco: Never   Substance Use Topics    Alcohol use: No     Works in Loss adjuster, chartered; single    FamHx:  Family History   Problem Relation Age of Onset    Heart disease Mother     Diabetes Mother     Kidney disease Mother     Stroke Brother     Colon cancer Brother 50    Prostate cancer Neg Hx     Ovarian cancer Neg Hx     Breast cancer Neg Hx     Hyperlipidemia Neg Hx     Hypertension Neg Hx     Anesthesia problems Neg Hx     Malignant hyperthermia Neg Hx     Hypotension Neg Hx     Malignant hypertension Neg Hx        Physical Exam:    11/30/2022  BP 137/83 (BP Location: Left arm, Patient Position: Sitting, Cuff Size: Large)  ~ Pulse 82  ~ Temp 36.3 ?C (97.4 ?F) (Forehead)  ~ Resp 18  ~ Ht 6' (1.829 m)  ~ Wt (!) 232 lb 9.6 oz (105.5 kg)  ~ SpO2 98%  ~ BMI 31.55 kg/m?     Pain Information (Last Filed)       Score Location Comments Edu?    0-No pain None None None              Wt Readings from Last 3 Encounters:   11/30/22 (!) 232 lb 9.6 oz (105.5 kg)   10/09/22 (!) 225 lb (102.1 kg)   08/07/22 (!) 226 lb 12.8 oz (102.9 kg)     BP Readings from Last 3 Encounters:   11/30/22 137/83   10/09/22 150/95   09/24/22 148/95       NAD  No ocular injection  No alopecia  OP clear  No LAD  CTAB  RRR  S/NT/ND  No C/C/E  No rashes  No telangectasia/discoloration/calcinosis/sclerodactyly  No tender joints  No swollen joints  L knee crepitus+ no effusion, no warmth   No enthesitis  No dactylitis  No myofascial pain  Neuro grossly intact except R extremities atrophy  Peripheral pulses intact                         Labs:  Lab Results   Component Value Date    WBC 4.32 03/20/2022    HGB 14.5 03/20/2022    HCT 43.4 03/20/2022    PLT 198 03/20/2022    NEUTABS 2.52 03/20/2022    LYMPHABS 1.38 03/20/2022  Lab Results   Component Value Date    GLUCOSE 99 10/09/2022    GFREST >89 04/18/2014    CREAT 0.56 (L) 10/09/2022    CALCIUM 9.7 10/09/2022    TOTPRO 8.0 10/09/2022    ALBUMIN 5.2 (H) 10/09/2022    AST 33 10/09/2022    ALT 21 10/09/2022    ALKPHOS 112 10/09/2022     @RRCOAGS @  Lab Results   Component Value Date    TSH 1.5 01/21/2017       Rheum labs:  Lab Results   Component Value Date    SRWEST 13 (H) 05/27/2017    SRWEST 15 (H) 11/28/2014     No results found for: ''CARDLIPIGA'', ''CARDLIPIGG'', ''CARDLIPIGM'', ''ANAAB'', ''ANAABTITER'', ''B2GLYPROIGA'', ''B2GLYPROIGG'', ''B2GLYPROIGM'', ''DRVVT'', ''RNPAB'', ''SMAB'', ''SSA''  No results found for: ''C3'', ''C4'', ''DSDNAAB'', ''NDNAABIFA''  No results found for: ''CANCA'', ''PANCA'', ''PROT3AB'', ''MPOAB''  Lab Results   Component Value Date    CKTOT 68 04/23/2008    URICACID 3.6 11/28/2014     Lab Results   Component Value Date    VITD25OH 35 10/09/2022    VITD25OH 37 09/30/2021     UA:   Lab Results   Component Value Date    PROTCLUR Trace 04/23/2008    BLDUR Negative 04/23/2008    LEUKESTUR Negative 04/23/2008    RBCSUR 5 04/23/2008    WBCSUR 3 04/23/2008       Studies: [x] reviewed [] ordered [] independently reviewed.  Component      Latest Ref Rng & Units 09/16/2018   Segmented Neutrophil      No Reference Range % 1   Lymphocyte      No Reference Range % 1   Monocyte      No Reference Range % 98   Total Cells      cells 100   Fluid Appearance       Viscoid . . .   RBC Count,Joint Fl      No Reference Range /cmm <1,000   WBC Count,Joint Fl      No Reference Range /cmm 282   Crystals,Fluid #1      No Crystals Seen No Crystals Seen       Imaging:    Knees XR 08/2017   Mild osteoarthritis of the left knee.  No significant arthritis of the right knee.    Knees 2007 normal     ASSESSMENT AND PLAN  Dustin Watts is a 67 y.o. male     Knees OA stable on prn prn ibuprofen 800 mg as needed; no GERD; pt takes rarely; recent slight worsening with effusion in L knee s/p CSI improved currently no significant effusion stable on PRN NSAID  Hx of epilepsy stable   pre-diabetic, HTN, HLD  Hx vit D deficiency WNL 02/2017  overweight    RECOMMENDATIONS  - Follow up with bilateral knee XR today for knee pain and hx of falls     - Follow up 11:00 am 12/30/2022; added to cancellation list for earlier appointment     - Consider L knee aspiration and CSI next visit     RTC  3 months or sooner as needed.  CC note to PCP/referred physician    Orders Placed This Encounter    XR knee ap+lat bilat (2 views)          IMMUNIZATION STATUS  Immunization History   Administered Date(s) Administered    COVID-19, mRNA, (Pfizer - Purple Cap) 30 mcg/0.3 mL 08/23/2020, 09/20/2020  Tdap 03/08/2016    influenza vaccine IM quadrivalent (Fluarix Quad) (PF) SYR (20 months of age and older) 08/04/2017, 09/08/2018, 08/23/2020, 08/05/2021, 08/07/2022    influenza vaccine IM quadrivalent (Fluzone Quad) (PF) SYR/SDV (34 months of age and older) 08/06/2016    influenza, unspecified formulation 08/06/2016, 08/04/2017     Thank you for allowing me to participate in this patient's care.  If I can be of further service, please contact my office at  [phone].  [x]  >50%  of this * minute visit was spent in face-to-face evaluation and counseling the patient.  The above plan of care, diagnosis, orders, and follow-up were discussed with the patient. Questions related to this recommended plan of care were answered  []  Immunosuppression drugs changed. [x]  Risks/benefits/toxicities of meds discussed.   []  Reviewed and summarized old records. []  Requested outside medical records.  []  Discussed with other providers of complex medical conditions and management plans outlined above    Liana Gerold MD Rheumatology Division, [place] [phone])    Attestation:  Scribe Signature:  I, Aslin Farinas Danielle Lupe Handley, have scribed for Dr. Liana Gerold with the documentation for Mancel Parsons on 11/30/2022 at 10:34 AM.    Physician Signature:  Dr.Thanda Aung 11/30/2022 10:34 AM     I have reviewed this note, scribed by Jerilee Hoh Toryn Dewalt, and attest that it is an accurate representation of my H & P and other events of the outpatient visit except if otherwise noted.

## 2022-11-30 ENCOUNTER — Telehealth: Payer: BLUE CROSS/BLUE SHIELD

## 2022-11-30 ENCOUNTER — Inpatient Hospital Stay: Payer: MEDICAID

## 2022-11-30 ENCOUNTER — Ambulatory Visit: Payer: MEDICAID

## 2022-11-30 DIAGNOSIS — Z9181 History of falling: Secondary | ICD-10-CM

## 2022-11-30 DIAGNOSIS — G40909 Epilepsy, unspecified, not intractable, without status epilepticus: Secondary | ICD-10-CM

## 2022-11-30 DIAGNOSIS — M170 Bilateral primary osteoarthritis of knee: Secondary | ICD-10-CM

## 2022-11-30 NOTE — Patient Instructions
Do knees XR    Consider drainage next time

## 2022-11-30 NOTE — Telephone Encounter
Hi Hanaly!     Dr. Juliette Alcide would like you to add this patient to the cancellation list.     Thank you!

## 2022-11-30 NOTE — Telephone Encounter
Pt has been added to the wait list.

## 2022-12-01 ENCOUNTER — Ambulatory Visit: Payer: BLUE CROSS/BLUE SHIELD

## 2022-12-01 DIAGNOSIS — M170 Bilateral primary osteoarthritis of knee: Secondary | ICD-10-CM

## 2022-12-01 DIAGNOSIS — G40909 Epilepsy, unspecified, not intractable, without status epilepticus: Secondary | ICD-10-CM
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
## 2022-12-01 NOTE — Patient Instructions
Please change the dose of your furosemide (Lasix) from 2 pills (40mg ) to 1 pill (20mg ) daily for your swelling.

## 2022-12-29 NOTE — Progress Notes
RHEUMATOLOGY OUTPATIENT CONSULTATION  PATIENT: Dustin Watts MRN: [id number] DOB: 11-10-1955  REASONS FOR VISIT/CHIEF COMPLAINT:OA      HPI:67 y.o. male former pt of Dr Roselee Nova with generalized OA, OK knees here to establish care.  Hx of epilepsy, pre-diabetic, HTN, HLD, vit D deficiency      Current visit patient reports the followings:-     12/30/2022 visit - overall doing well - experiencing pain in L knee at night. Patient requests joints injection and aspiration today 12/30/2022  XR last visit Bilateral knees OA left knee effusion     11/28/2022 visit - Patient has not been seen since 10/2019  Larey Seat twice during the pandemic, the most recent 2023 / Pain in both knees, worse in L / Other joints (hands and wrists are ok)- it like it to be drained left knee  03/2022 - Foot cyst removal   Hx of seizure diagnosis, but free from seizures for long time       10/25/2019  - doing well ibuprofen may be 3 times a week left knee bothers walking not bad ''ibuprofen before doing a lot of walking'' no stomach issue aware not to take daily  - lost about 3 lbs doing squat daily 200s and bike      07/2019  - staying home due to covid  - L knee is not bad; occasional small pain with walking ''do I have fluid in my knee''  - Ibuprofen 800 mg; tried not to take too many; 4-5 times a month or so could be more e.g before going out/walking    03/2019  - L knee doing OK; last injection pain after the procedure lasted 1.5 month; now fine; walking fine  - L ankle strain earlier this year not recall splint  - Ibuprofen 800 mg prn BID but not need them; taking oxycodone as needed on average once at night for the chronic LBP    09/2018  - overall good no falls; L knee some swelling and worsening pain wondering if take fluid out or if any fluid inside  - ibuprofen as needed 800 mg 2 times a day only prn for years no problem with food no indigestion  - otherwise doing well      11/2017   -last visit increasing knee pain XR R knee good L knee mild OA - doing well; no seizure any longer  - joints are not bad; no pain with walking; no need to take ibuprofen regularly; takes infrequently- a day before and on day out e.g going to [place]; requesting refill; no problem no GERD/taking with food    08/2017  -last visit 02/2017 joints are doing fine on prn NSAID/post CVA  -knees are doing not too badly ''only after long walk'' ''described as blown up'', keen to have refill ibuprofen 800 mg PRN given by Dr Roselee Nova in past ''I only take it if I have a long walk to do so taking infrequently''; no stiffness; at times puffy not today; slight worse lately with pain   -other joints are fine  -chronic R sided atrophy since borne Dx cerebral palsy otherwise in good health  -no seizure for some time now      No known history of malar rash, photosensitivity rash, discoid lesions, oral/nasal ulcers,  pleuritic chest pain, seizures/psychosis, Raynaud's, skin tightness, dry eyes/mouth, hair loss or blood clots.  No history of muscle pain or proximal myopathy.  No history for fever, weight loss, loss of appetite, night sweat or  lymphadenopathy.  No history of skin psoriasis, eye issues, STDs or IBD.    No recent travelling.  No sick contacts.    Prior medication/Joint injections/Immune suppressant therapy:  None    ROS:  A 14 point ROS was obtained and significant for what is mentioned in HPI and below.    ROS    (Check if Normal, or note + findings):   []    reviewed questionnaire on 12/30/2022    []   Not Obtainable :   [x]    Constit : [-] fever, [-] weight loss    [x]    Skin :[-] vasculitic palpable rash, [-] tender nodules, [-]scaling plaques      [x]    Eyes : [-] photophobia, [-] red eye [-] gritty eyes    [x]    CV :[-] LE swelling, orthopnea, [-] PND,    []    MS :     [x]    ENT: [-] ear /nose swelling, [-] throat pain or post nasal d/c   [x]    GI :[-] constipation [-] diarrhea  [-] melena [-]heartburn,   [x]    Heme/Lymph :[-] easy bruising or bleeding      [x]   Resp : [-] DOE, [-] wheezing [-] pleurisy   [x]    GU:[-]dysruria, [-] gross hematuria   []    Neuro :[-] focal neurological [-] wrist or foot drop     [x]    Imm/All :[-] recurrent infections in past, [-] pollen or seasonal allergy   [x]   Endo :   [x]    Psych: [-] mood, [-] memory     PMHx:  Past Medical History:   Diagnosis Date    Acne     Arthritis     Cerebral palsy (HCC/RAF)     Eczema     Hypertension     ''White coat syndrome''    Impaired glucose tolerance     Low vitamin D level     obese     Seizure (HCC/RAF) 1973    chronically on Dilantin, no seizure in 25 years, followed by Neurologist (Dr. Wiliam Ke) at [facility]    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)     R foot s/p surgical removal    Shortening of arm, congenital     right arm       PSHx:  Past Surgical History:   Procedure Laterality Date    rectal abscess  2010    surgery done at [facility] the [facility]       Meds:    Medications that the patient states to be currently taking   Medication Sig    acetaminophen 325 mg tablet Take 2 tablets (650 mg total) by mouth every six (6) hours as needed (mild pain).    ASPIRIN LOW DOSE 81 MG EC tablet TAKE 1 TABLET BY MOUTH EVERY DAY    atorvastatin 10 mg tablet Take 1 tablet (10 mg total) by mouth at bedtime.    benzoyl peroxide-erythromycin 5-3% gel Apply to acne once daily..    Calcium Carb-Cholecalciferol (OYSTER SHELL CALCIUM W/D) 500-200 MG-UNIT TABS     Calcium Carb-Cholecalciferol 500-10 MG-MCG CHEW TAKE 1 CHEWABLE BY MOUTH THREE TIMES DAILY    diclofenac Sodium (VOLTAREN) 1% gel     diclofenac Sodium 1% gel Apply 2 g topically four (4) times daily as needed.    DILANTIN 100 MG ER capsule TAKE 4 CAPSULES ORALLY AT BEDTIME    docusate 100 mg capsule     furosemide 20 mg tablet Take 1  tablet (20 mg total) by mouth daily.    ibuprofen 600 mg tablet Take 1 tablet (600 mg total) by mouth daily as needed.    oxyCODONE 5 mg tablet Take 1-2 tablets (5-10 mg total) by mouth every four (4) hours as needed for Moderate Pain (Pain Scale 4-6) or Severe Pain (Pain Scale 7-10). Max Daily Amount: 60 mg    RETIN-A MICRO PUMP 0.04 % gel Apply topically at bedtime.    sulfacetamide-sulfur 9-4.5 % LIQD liquid Apply to face twice daily.    Tretinoin Microsphere (RETIN-A MICRO PUMP) 0.06 % GEL APPLY A PEA SIZED AMOUNT TOPICALLY TO AFFECTED AREA OF FACE NIGHTLY.       Allergies:  Allergies   Allergen Reactions    Topiramate      Eye swelling    Sulfa Antibiotics Angioedema     No anaphylaxis    Amlodipine Other (See Comments)     Lip swelling without anaphylaxis    Carvedilol Angioedema     Lip swelling without anaphylaxis    Losartan Rash     No hive, no anaphylaxis, unclear if truly has rash from this medicine    Penicillins Rash     ''bumps on body''       SocHx:  Social History     Tobacco Use    Smoking status: Never     Passive exposure: Never    Smokeless tobacco: Never   Substance Use Topics    Alcohol use: No     Works in Loss adjuster, chartered; single    FamHx:  Family History   Problem Relation Age of Onset    Heart disease Mother     Diabetes Mother     Kidney disease Mother     Stroke Brother     Colon cancer Brother 50    Prostate cancer Neg Hx     Ovarian cancer Neg Hx     Breast cancer Neg Hx     Hyperlipidemia Neg Hx     Hypertension Neg Hx     Anesthesia problems Neg Hx     Malignant hyperthermia Neg Hx     Hypotension Neg Hx     Malignant hypertension Neg Hx        Physical Exam:    12/30/2022  BP 132/82  ~ Pulse 72  ~ Temp (!) 35.6 ?C (96 ?F) (Forehead)  ~ Resp 16  ~ Ht 6' (1.829 m)  ~ Wt (!) 230 lb (104.3 kg)  ~ SpO2 99%  ~ BMI 31.19 kg/m?     Pain Information (Last Filed)       Score Location Comments Edu?      3 None None None              NAD  No ocular injection  No alopecia  OP clear  No LAD  CTAB  RRR  S/NT/ND  No C/C/E  No rashes  No telangectasia/discoloration/calcinosis/sclerodactyly  No tender joints  No swollen joints  L knee crepitus+, small effusion, no warmth   No enthesitis  No dactylitis  No myofascial pain  Neuro grossly intact except R extremities atrophy  Peripheral pulses intact                         Labs:  Lab Results   Component Value Date    WBC 4.32 03/20/2022    HGB 14.5 03/20/2022    HCT 43.4 03/20/2022    PLT 198  03/20/2022    NEUTABS 2.52 03/20/2022    LYMPHABS 1.38 03/20/2022     Lab Results   Component Value Date    GLUCOSE 99 10/09/2022    GFREST >89 04/18/2014    CREAT 0.56 (L) 10/09/2022    CALCIUM 9.7 10/09/2022    TOTPRO 8.0 10/09/2022    ALBUMIN 5.2 (H) 10/09/2022    AST 33 10/09/2022    ALT 21 10/09/2022    ALKPHOS 112 10/09/2022     @RRCOAGS @  Lab Results   Component Value Date    TSH 1.5 01/21/2017       Rheum labs:  Lab Results   Component Value Date    SRWEST 13 (H) 05/27/2017    SRWEST 15 (H) 11/28/2014     No results found for: ''CARDLIPIGA'', ''CARDLIPIGG'', ''CARDLIPIGM'', ''ANAAB'', ''ANAABTITER'', ''B2GLYPROIGA'', ''B2GLYPROIGG'', ''B2GLYPROIGM'', ''DRVVT'', ''RNPAB'', ''SMAB'', ''SSA''  No results found for: ''C3'', ''C4'', ''DSDNAAB'', ''NDNAABIFA''  No results found for: ''CANCA'', ''PANCA'', ''PROT3AB'', ''MPOAB''  Lab Results   Component Value Date    CKTOT 68 04/23/2008    URICACID 3.6 11/28/2014     Lab Results   Component Value Date    VITD25OH 35 10/09/2022    VITD25OH 37 09/30/2021     UA:   Lab Results   Component Value Date    PROTCLUR Trace 04/23/2008    BLDUR Negative 04/23/2008    LEUKESTUR Negative 04/23/2008    RBCSUR 5 04/23/2008    WBCSUR 3 04/23/2008       Studies: [x] reviewed [] ordered [] independently reviewed.  Component      Latest Ref Rng & Units 09/16/2018   Segmented Neutrophil      No Reference Range % 1   Lymphocyte      No Reference Range % 1   Monocyte      No Reference Range % 98   Total Cells      cells 100   Fluid Appearance       Viscoid . . .   RBC Count,Joint Fl      No Reference Range /cmm <1,000   WBC Count,Joint Fl      No Reference Range /cmm 282   Crystals,Fluid #1      No Crystals Seen No Crystals Seen       Imaging:    XR Knees B/L 11/2022:   R- IMPRESSION: The previously noted material was extrinsic about the right knee. There is mild right and moderate left knee osteoarthritis.  L - IMPRESSION:  1. Limited examination due to overlying material. Rectangular density projecting over the suprapatellar recess on the lateral view of the right knee may be external. The patient will be called back to the department for repeat frontal and lateral views   of the right knee.  2. There is progression of left knee osteoarthritis, now moderate.     Knees XR 08/2017   Mild osteoarthritis of the left knee.  No significant arthritis of the right knee.    Knees 2007 normal     ASSESSMENT AND PLAN  DEVANTAE BABE is a 67 y.o. male     # Knees OA stable on prn prn ibuprofen 800 mg as needed; no GERD; pt takes rarely; recent slight worsening with effusion in L knee s/p CSI 2019- synovial fluid no crystals    12/30/2022 - pain in L knee at night and feels there is fluid - aspiration and CSI of L knee today XR Knees 11/2022 shows mild osteoarthritis of R  knee and moderate osteoarthritis of L knee  5 cc fluid obtained          Hx of epilepsy stable   pre-diabetic, HTN, HLD  Hx vit D deficiency WNL 02/2017  overweight    RECOMMENDATIONS    L knee aspiration and CSI today 12/30/2022     Follow up in 4 months 04/2023 - potential L knee injection - 40 minutes       RTC  4 months or sooner as needed.  CC note to PCP/referred physician    Procedure Note:  L knee aspiration and steroid injection  Physician performing injection: Liana Gerold, MD, Rheumatology        The risks, benefits, and alternatives of the procedure were explained, and verbal consent was obtained.  The risks and benefits of the injection were discussed with the patient including but not limited to the risk of allergic reactions, infection, flare reaction, damage to local nerves, tendons or vessels, skin color changes (usually hyperpigmentation), local fat atrophy, the risk of blood sugar elevations and a hyperactivity response. The patient understands these risks elected to proceed.     Area over medial aspect of knee was prepped with betadyne and numbed with 1% xylocaine. 22-gauge needle was inserted, and Kenalog 40mg  + 4cc 1% xylocaine were injected to left knee. Patient tolerated procedure well without any immediate complications. Patient was instructed to monitor for s/s of reaction to steroid and infection.  ED precaution discussed.  Ice applied immediately at site of injection.      5 cc of clear synovial fluid was removed prior to the injection.   Synovial fluid sent for further analysis including crystals and cell count.     Orders Placed This Encounter    Inject/Drain Knee    Joint Fluid Cell Count & Differential    Crystals,Fluid    Joint Fluid, Cell Count    Joint Fluid, Differential    lidocaine PF 1% inj 2 mL    triamcinolone acetonide 40 mg/mL inj 40 mg          Thank you for allowing me to participate in this patient's care.  If I can be of further service, please contact my office at  [phone].  [x]  >50%  of this * minute visit was spent in face-to-face evaluation and counseling the patient.  The above plan of care, diagnosis, orders, and follow-up were discussed with the patient. Questions related to this recommended plan of care were answered  []  Immunosuppression drugs changed. [x]  Risks/benefits/toxicities of meds discussed.   []  Reviewed and summarized old records. []  Requested outside medical records.  []  Discussed with other providers of complex medical conditions and management plans outlined above    Liana Gerold MD Rheumatology Division, [place] [phone])    Attestation:  Scribe Signature:  I, Mia Danielle Celestin, have scribed for Dr. Liana Gerold with the documentation for Mancel Parsons on 12/30/2022 at 11:12 AM.    Physician Signature:  Dr.Tanika Bracco 12/30/2022 11:12 AM     I have reviewed this note, scribed by Jerilee Hoh Celestin, and attest that it is an accurate representation of my H & P and other events of the outpatient visit except if otherwise noted.

## 2022-12-30 ENCOUNTER — Telehealth: Payer: BLUE CROSS/BLUE SHIELD

## 2022-12-30 ENCOUNTER — Ambulatory Visit: Payer: MEDICAID

## 2022-12-30 DIAGNOSIS — M1712 Unilateral primary osteoarthritis, left knee: Secondary | ICD-10-CM

## 2022-12-30 DIAGNOSIS — M25462 Effusion, left knee: Secondary | ICD-10-CM

## 2022-12-30 DIAGNOSIS — M170 Bilateral primary osteoarthritis of knee: Secondary | ICD-10-CM

## 2022-12-30 LAB — Joint Fluid Differential: MONOCYTE,FLUID PERCENT: 80

## 2022-12-30 LAB — Joint Fluid Cell Count

## 2022-12-30 LAB — Crystals,Fluid

## 2022-12-30 MED ADMIN — LIDOCAINE HCL (PF) 1 % IJ SOLN: 2 mL | INTRA_ARTICULAR | @ 19:00:00 | Stop: 2022-12-30 | NDC 63323049227

## 2022-12-30 MED ADMIN — TRIAMCINOLONE ACETONIDE 40 MG/ML IJ SUSP: 40 mg | INTRA_ARTICULAR | @ 19:00:00 | Stop: 2022-12-30 | NDC 00703024101

## 2022-12-30 NOTE — Patient Instructions
L knee aspiration and CSI today    Please ice injection site few times today and tomorrow, about 10-15 minutes. Please do not overuse the injected joint/area for 48 hours after injection. Please call me for any signs of infection, such as fever, chills, local warmth, redness, and/or pain.  Please keep your appointments. Thank you!     Follow up in 4 months 04/2023 - potential L knee injection - 40 minutes

## 2022-12-30 NOTE — Procedures
Procedure Note:  L knee aspiration and steroid injection  Physician performing injection: Liana Gerold, MD, Rheumatology      The risks, benefits, and alternatives of the procedure were explained, and verbal consent was obtained.  The risks and benefits of the injection were discussed with the patient including but not limited to the risk of allergic reactions, infection, flare reaction, damage to local nerves, tendons or vessels, skin color changes (usually hyperpigmentation), local fat atrophy, the risk of blood sugar elevations and a hyperactivity response. The patient understands these risks elected to proceed.    Area over medial aspect of knee was prepped with betadyne and numbed with 1% xylocaine. 22-gauge needle was inserted, and Kenalog 40mg  + 4cc 1% xylocaine were injected into left knee. Patient tolerated procedure well without any immediate complications. Patient was instructed to monitor for s/s of reaction to steroid and infection.  ED precaution discussed.  Ice applied immediately at site of injection.     5 cc of clear synovial fluid was removed prior to the injection.   Synovial fluid sent for further analysis including crystals and cell count.

## 2022-12-30 NOTE — Telephone Encounter
Message to Practice/Provider      Message: Patient calling for  refill of Water pill (does not have the name of the water pill) and Asprin  does not have a mg     Not able to collect any prescription information    Return call is not being requested by the patient or caller.    Patient or caller has been notified of the turnaround time of 1-2 business day(s).  Yes

## 2022-12-31 MED ORDER — FUROSEMIDE 20 MG PO TABS
20 mg | ORAL_TABLET | Freq: Every day | ORAL | 1 refills
Start: 2022-12-31 — End: ?

## 2022-12-31 MED ORDER — ASPIRIN 81 MG PO TBEC
81 mg | ORAL_TABLET | Freq: Every day | ORAL | 3 refills | 30.00000 days
Start: 2022-12-31 — End: ?

## 2022-12-31 NOTE — Telephone Encounter
Sent to [facility] on refill encounter.

## 2022-12-31 NOTE — Telephone Encounter
Sent RX request to [facility] through another encounter.

## 2023-01-01 MED ORDER — ASPIRIN 81 MG PO TBEC
81 mg | ORAL_TABLET | Freq: Every day | ORAL | 3 refills
Start: 2023-01-01 — End: ?

## 2023-01-01 MED ORDER — ASPIRIN LOW DOSE 81 MG PO TBEC
ORAL_TABLET | ORAL | 3 refills | 30.00000 days
Start: 2023-01-01 — End: ?

## 2023-01-01 MED ORDER — FUROSEMIDE 20 MG PO TABS
20 mg | ORAL_TABLET | Freq: Every day | ORAL | 1 refills
Start: 2023-01-01 — End: ?

## 2023-01-04 ENCOUNTER — Telehealth: Payer: MEDICAID

## 2023-01-04 MED ORDER — FUROSEMIDE 20 MG PO TABS
20 mg | ORAL_TABLET | Freq: Every day | ORAL | 1 refills
Start: 2023-01-04 — End: ?

## 2023-01-04 MED ORDER — ASPIRIN LOW DOSE 81 MG PO TBEC
ORAL_TABLET | 3 refills
Start: 2023-01-04 — End: ?

## 2023-01-04 NOTE — Telephone Encounter
Called pt left [facility] to call back to rs appt due to MD being out.

## 2023-01-04 NOTE — Progress Notes
Surgical Oncology Progress Note    PATIENT: Dustin Watts  MRN: [id number]  DOB: May 28, 1956  DATE OF SERVICE: 01/04/2023   CONSULTING ATTENDING: Lowella Petties, MD    Reason for Visit:     umbilical hernia    History of Present Illness:     Dustin Watts is a 67 y.o. male with history of cerebral palsy, epilepsy, and hypertension who presents today for annual followup regarding his umbilical hernia which is currently being observed. The patient initially became aware of his hernia when he presented to an OSH in 2021 for separate chief complaint, was told that he had an umbilical hernia and subsequently referred to [facility] general surgery. When initially seen in clinic in 2021 was hesitant to pursue surgical repair but wanted to be followed in case symptoms worsened. Last seen in clinic 11/30/2020.     Pt states that last August he lost his footing and sustained a right foot injury which has made it difficult to walk. As a result, he has not been able to lose much weight. Also states that he has abdominal pain when squatting at the gym so he had avoided that activity. States that he is unsure if the bulge is reducible because he has not tried to push it back in. States that he is not in constant abdominal pain, denies fevers/chills, skin changes, or tenderness to touch to abdominal bulge.     History of prediabetes, states he is a nonsmoker.     Interval events:   01/04/23: Last visit he was recommended to lose weight (200lb goal weight). Today, he returns at weight of 230lbs. He reports that his hernia ***      Past Medical History:     Past Medical History:   Diagnosis Date    Acne     Arthritis     Cerebral palsy (HCC/RAF)     Eczema     Hypertension     ''White coat syndrome''    Impaired glucose tolerance     Low vitamin D level     obese     Seizure (HCC/RAF) 1973    chronically on Dilantin, no seizure in 25 years, followed by Neurologist (Dr. Wiliam Ke) at [facility]    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) R foot s/p surgical removal    Shortening of arm, congenital     right arm        Medications:     Current Outpatient Medications   Medication Instructions    acetaminophen (TYLENOL) 650 mg, Oral, Every 6 hours PRN    ASPIRIN LOW DOSE 81 MG EC tablet TAKE 1 TABLET BY MOUTH EVERY DAY    atorvastatin (LIPITOR) 10 mg, Oral, Every night at bedtime    benzoyl peroxide-erythromycin 5-3% gel Apply to acne once daily.    Calcium Carb-Cholecalciferol (OYSTER SHELL CALCIUM W/D) 500-200 MG-UNIT TABS No dose, route, or frequency recorded.    Calcium Carb-Cholecalciferol 500-10 MG-MCG CHEW TAKE 1 CHEWABLE BY MOUTH THREE TIMES DAILY    diclofenac Sodium (VOLTAREN) 1% gel     diclofenac Sodium (VOLTAREN) 2 g, Topical, 4 times daily PRN    DILANTIN 100 MG ER capsule TAKE 4 CAPSULES ORALLY AT BEDTIME    docusate 100 mg capsule No dose, route, or frequency recorded.    furosemide (LASIX) 20 mg, Oral, Daily    ibuprofen (ADVIL, MOTRIN) 600 mg, Oral, Daily PRN    oxyCODONE 5-10 mg, Oral, Every 4 hours PRN  RETIN-A MICRO PUMP 0.04 % gel Topical, Every night at bedtime    sulfacetamide-sulfur 9-4.5 % LIQD liquid Apply to face twice daily    Tretinoin Microsphere (RETIN-A MICRO PUMP) 0.06 % GEL APPLY A PEA SIZED AMOUNT TOPICALLY TO AFFECTED AREA OF FACE NIGHTLY        Review of Systems:     Negative aside from what is documented in HPI.    Objective:     Physical Exam:    Vital Signs: There were no vitals taken for this visit.  GEN: NAD, WDWN, alert & appropriate. Obese body habitus.  HEENT: Sclerae anicteric, NCAT, EOMI.   CV: Regular rate.  RESP: Breathing unlabored on room air.  ABD: S/NT/ND. Reducible, soft umbilical hernia. ***  EXTR:  No clubbing, cyanosis or edema.   NEURO:  Grossly intact without any focal deficits.     Pathology:  FINAL DIAGNOSIS   Date Value Ref Range Status   04/08/2022   Final       A. FOOT, CYST, RIGHT (EXCISION):   - Benign cyst and fibrous tissue          Studies:  No images are attached to the encounter.     Labs:  Lab Results   Component Value Date    WBC 4.32 03/20/2022    WBC 6.44 02/14/2009    HGB 14.5 03/20/2022    HGB 14.0 02/14/2009    HCT 43.4 03/20/2022    HCT 41.7 02/14/2009    PLT 198 03/20/2022    PLT 183 02/14/2009    BUN 10 10/09/2022    BUN 13 04/18/2014    BUN 10 02/14/2009    CREAT 0.56 (L) 10/09/2022    CREAT 0.7 02/14/2009    NA 139 10/09/2022    NA 141 02/14/2009    K 4.1 10/09/2022    K 4.3 02/14/2009    CHOL 258 09/30/2021    CHOLHDL 52 09/30/2021    NOHDLCHOCAL 206 (H) 09/30/2021    TRIGLY 193 (H) 09/30/2021    ALT 21 10/09/2022    ALT 20 02/14/2009    AST 33 10/09/2022    AST 21 02/14/2009    HGBA1C 6.0 (H) 10/09/2022    HGBA1C 5.6 04/23/2008    TSH 1.5 01/21/2017    TSH 0.87 04/23/2008    INR 1.1 05/27/2017    INR 1.0 03/23/2005       Assessment:     Dustin Watts is a 67 y.o. male with history of cerebral palsy, epilepsy, and hypertension who presents today for annual followup regarding his umbilical hernia which is currently being observed. The patient initially became aware of his hernia when he presented to an OSH in 2021 for separate chief complaint, was told that he had an umbilical hernia and subsequently referred to [facility] general surgery. When initially seen in clinic in 2021 was hesitant to pursue surgical repair but wanted to be followed in case symptoms worsened. At last clinic visit, he was recommended to lose weight to goal 200lbs. He returns today still above goal at 230lbs.     As hernia repair would be elective, recommend patient loses weight (goal of 200lb) before proceeding with further surgical planning.     Plan/Recommendations:     - recommend weight loss (200lb goal weight)  - will consider repair of umbilical hernia after patient obtains goal weight  - will schedule followup appointment in 6 months  - return precautions given    DWA Dr. Mordecai Maes.  Author:  Lavonna Rua. Clyde Canterbury  General Surgery PGY1  01/04/2023 8:34 AM   Department of General Surgery  Surgical Oncology (C Service)  [phone]

## 2023-01-05 ENCOUNTER — Ambulatory Visit: Payer: BLUE CROSS/BLUE SHIELD

## 2023-01-05 MED ORDER — FUROSEMIDE 20 MG PO TABS
20 mg | ORAL_TABLET | Freq: Every day | ORAL | 1 refills | Status: AC
Start: 2023-01-05 — End: ?

## 2023-01-05 MED ORDER — ASPIRIN 81 MG PO TBEC
81 mg | ORAL_TABLET | Freq: Every day | ORAL | 3 refills | Status: AC
Start: 2023-01-05 — End: ?

## 2023-01-08 ENCOUNTER — Ambulatory Visit: Payer: MEDICAID

## 2023-01-29 ENCOUNTER — Telehealth: Payer: BLUE CROSS/BLUE SHIELD

## 2023-01-29 NOTE — Telephone Encounter
Reached out to the patient to ask if he can provide me the reason he will be seeing Dr Orlene Och on 03/26 since it was not added on the appointment note and the patient does not have any recent imaging records.     NOTE: The appointment was scheduled by the Call Center.     The patient stated he was scheduled to follow up on a Hernia but also asked if the doctor recommends to reschedule the appointment because he has not lost any weight due to a recent knee surgery.       The patient was informed a STAT notice will be sent to the MD and NP for the medical suggestion and we will reach out by Monday with the plan.

## 2023-02-01 NOTE — Telephone Encounter
The appointment has been rescheduled for 2025,     PT was made area that the appointment date and time may change since its being rescheduled that far out.

## 2023-02-01 NOTE — Telephone Encounter
Reply by: Emmaline Life  It is for the umbilical hernia but if he doesn't want to come or hasn't lost weight to a goal of 200lbs then no need to come

## 2023-02-02 ENCOUNTER — Ambulatory Visit: Payer: MEDICAID

## 2023-02-19 ENCOUNTER — Ambulatory Visit: Payer: BLUE CROSS/BLUE SHIELD

## 2023-02-19 DIAGNOSIS — M170 Bilateral primary osteoarthritis of knee: Secondary | ICD-10-CM

## 2023-02-19 NOTE — Progress Notes
PATIENT: Dustin Watts  MRN: [id number]  DOB: 1956-03-20  DATE OF SERVICE: 02/19/2023    CHIEF COMPLAINT:   Chief Complaint   Patient presents with    Follow-up        History of Present Illness  Dustin Watts is a 67 y.o. male presents for:     Knee swelling  - L knee  - chronic  - drained by rhuem in Feb, diagnosis of OA  - mildly recurred after  - exercising twice a day        ROS  No fever, no rash, no cough, no wheezing    (click to expand/collapse)      Past Medical History:   Diagnosis Date    Acne     Arthritis     Cerebral palsy (HCC/RAF)     Eczema     Hypertension     ''White coat syndrome''    Impaired glucose tolerance     Low vitamin D level     obese     Seizure (HCC/RAF) 1973    chronically on Dilantin, no seizure in 25 years, followed by Neurologist (Dr. 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    aspirin (ASPIRIN LOW DOSE) 81 mg EC tablet Take 1 tablet (81 mg total) by mouth daily.    atorvastatin 10 mg tablet Take 1 tablet (10 mg total) by mouth at bedtime.    benzoyl peroxide-erythromycin 5-3% gel Apply to acne once daily..    Calcium Carb-Cholecalciferol (OYSTER SHELL CALCIUM W/D) 500-200 MG-UNIT TABS     Calcium Carb-Cholecalciferol 500-10 MG-MCG CHEW TAKE 1 CHEWABLE BY MOUTH THREE TIMES DAILY    diclofenac Sodium (VOLTAREN) 1% gel     diclofenac Sodium 1% gel Apply 2 g topically four (4) times daily as needed.    DILANTIN 100 MG ER capsule TAKE 4 CAPSULES ORALLY AT BEDTIME    docusate 100 mg capsule     furosemide 20 mg tablet Take 1 tablet (20 mg total) by mouth daily.    ibuprofen 600 mg tablet Take 1 tablet (600 mg total) by mouth daily as needed.    oxyCODONE 5 mg tablet Take 1-2 tablets (5-10 mg total) by mouth every four (4) hours as needed for Moderate Pain (Pain Scale 4-6) or Severe Pain (Pain Scale 7-10). Max Daily Amount: 60 mg    RETIN-A MICRO PUMP 0.04 % gel Apply topically at bedtime.    sulfacetamide-sulfur 9-4.5 % LIQD liquid Apply to face twice daily.    Tretinoin Microsphere (RETIN-A MICRO PUMP) 0.06 % GEL APPLY A PEA SIZED AMOUNT TOPICALLY TO AFFECTED AREA OF FACE NIGHTLY.       Physical Exam  Vitals:    02/19/23 0951 02/19/23 0954   BP: 153/88 153/83   Pulse: 97    Temp: 36.5 ?C (97.7 ?F)    TempSrc: Forehead    SpO2: 95%    Weight: (!) 228 lb 9.6 oz (103.7 kg)    Height: 6' (1.829 m)      Wt Readings from Last 3 Encounters:   02/19/23 (!) 228  lb 9.6 oz (103.7 kg)   12/30/22 (!) 230 lb (104.3 kg)   12/01/22 (!) 229 lb (103.9 kg)     Body mass index is 31 kg/m?Marland Kitchen  System Check if normal Positive or additional negative findings   Constit  [x]  General appearance     Eyes  []  Conj/Lids []  Pupils  []  Fundi     HENMT  []  External ears/nose []  TMs (bilateral)   []  Gross Hearing []  Nasal mucosa   []  Lips/teeth/gums []  Oropharynx    []  Mucus membranes []  Head     Neck  []  Inspection/palpation []  Thyroid     Resp  [x]  Effort []  No wheezing    []  Auscultation  []  No crackles     CV  []  Rhythm/rate []  Murmurs []  LEE    []  JVP non-elevated []  Radial pulses     Breast  []  Inspection []  Palpation     GI  []  No abd masses  []  No tenderness   []  No rebound/guarding  []  Liver/spleen      GU  []  Scrotum []  Penis []  Prostate      Lymph  Normal: []  Neck []  Axillae []  Groin     MSK site examined:    []  Inspect/palp []  ROM []  Strength/tone   Mild L knee edema with FROM, 1+ pitting edema distally     Skin  []  Inspection []  Palpation  Site:     Neuro  []  CN2-12 intact grossly   []  Alert and oriented   []  Muscle strength - legs     []  Sensation  []  Gait/balance     Psych  []  Insight/judgement  []  Mood/affect           Lab and Studies:  Lab Results   Component Value Date    NA 139 10/09/2022    K 4.1 10/09/2022    CL 101 10/09/2022    CO2 26 10/09/2022    BUN 10 10/09/2022    CREAT 0.56 (L) 10/09/2022    GLUCOSE 99 10/09/2022    CALCIUM 9.7 10/09/2022    ALT 21 10/09/2022    AST 33 10/09/2022    ALKPHOS 112 10/09/2022    BILITOT 0.2 10/09/2022     Lab Results   Component Value Date    WBC 4.32 03/20/2022    HGB 14.5 03/20/2022    HCT 43.4 03/20/2022    MCV 97.5 03/20/2022    PLT 198 03/20/2022     Lab Results   Component Value Date    HGBA1C 6.0 (H) 10/09/2022     Lab Results   Component Value Date    TSH 1.5 01/21/2017     Lab Results   Component Value Date    VITD25OH 35 10/09/2022     Lab Results   Component Value Date    CHOL 258 09/30/2021    CHOLHDL 52 09/30/2021    CHOLDLCAL 167 (H) 09/30/2021    TRIGLY 193 (H) 09/30/2021               Assessment and Plan       ICD-10-CM    1. Primary osteoarthritis of both knees  M17.0       2. Class 1 obesity due to excess calories with serious comorbidity and body mass index (BMI) of 31.0 to 31.9 in adult  E66.09     Z68.31       3. Prediabetes  R73.02       4. Elevated  cholesterol with elevated triglycerides  E78.2 Lipid Panel     Lipid Panel      5. Low vitamin D level  R79.89               Problem   Elevated Cholesterol With Elevated Triglycerides    01/21/2017 - ASCVD score about 10%, patient declines medicine, counseled on lifestyle interventions  09/29/2018 - briefly on Lipitor but stopped (headaches, bloating)   01/03/2021 - ASCVD 15.8%  09/30/2021 - again advised statin, will start Lipitor 10, counseled on side effects  12/26/2021 - stopped Lipitor, counseled on restarting  02/19/2023 - declines Lipitor, counseled on risks/benefits and strongly advised to restart, recheck lipids today, counseled on exercise     Low Vitamin D Level    taking antiepiletpic which may decrease vit D level  03/04/2018- adequate recent level, can change from weekly to daily supplementation  09/30/2021 - 37  03/20/2022 - counseled on regular supplementation  02/19/2023 - recently reassuring Prediabetes    Chronic, relatively stable  04/14/2019- 5.8, counseled on exercise including continue biking  01/03/2021 - 6.0, counseled on healthy foods  10/09/2022 - 6.0  12/01/2022 - counseled on diet/exercise  02/19/2023 - continue with regular exercise recheck A1C     Class 1 Obesity Due to Excess Calories With Serious Comorbidity and Body Mass Index (Bmi) of 31.0 to 31.9 in Adult    04/02/2016 - neg screen for OSA  12/01/2022 - counseled on exercise along with adequate management of chronic knee pain  02/19/2023 - counseled on continue exercise which he currently is doing BID although no recent weight loss, counseled on option of Wegovy/Zepbound and he prefers to continue with lifestyle changes, declines nutritionist, goal is 2000 lbs for hernia surgery     Osteoarthritis of Both Knees    03/19/2016 - followed by rheum, doing well on ibuprofen prn and knee brace  05/07/2016 - counseled to take ibuprofen infrequently to avoid side effects including worsening of the BP  10/22/2016 - infrequent use of ibuprofen (a couple times a week), counseled to maintain low frequency of use because of HTN  01/21/2017 - no current pain, will only take ibuprofen infrequently, no role for imaging or PT at this time.  Counseled on weight loss.  07/01/2017- mild pain, infrequent Tylenol, no NSAIDs, no change in this current management and encouraged to increase exercise regimen.  09/29/2018 - chronic, stable, f/u with Dr Myrtice Lauth (in this office) or Rheumatologist for arthrocentesis if fluid recurs  10/09/2022 - recent edema and pain after biking, will f/u with ortho for drainage prn  12/01/2022 - recent saw rheum, plan for ibuprofen prn and f/u for left knee arthrocentesis.  Also repeat xrays today  02/19/2023 - recent arthrocentesis with rheum, mild reaccumulation of fluid in the knee but overall improved, will continue with regular exercise as it has not worsened the pain.  Naproxen and Tylenol prn             The above recommendation were discussed with the patient.  The patient has all questions answered satisfactorily and is in agreement with this recommended plan of care.    Return if symptoms worsen or fail to improve.     Author:  Lendon Collar. Aijalon Kirtz 02/19/2023 3:09 PM  Time of note filed does not necessarily reflect the time of encounter.  Portions of this note may have been created with voice recognition software. Occasional wrong-word or ''sound-alike'' substitutions may have occurred due to the inherent limitations of voice recognition  software. Please read the chart carefully and recognize, using context, where these substitutions have occurred.

## 2023-02-19 NOTE — Patient Instructions
[place] has outstanding physicians, including at our clinic in [facility], who are available to continue your care.  Specifically Drs Janus Molder, Rushie Chestnut are outstanding physicians to whom I would entrust my own care.  They all see patients of all ages and they accept the same insurance.

## 2023-02-20 LAB — Lipid Panel: CHOLESTEROL,LDL,CALCULATED: 141 mg/dL — ABNORMAL HIGH (ref >40–<100)

## 2023-03-30 ENCOUNTER — Ambulatory Visit: Payer: BLUE CROSS/BLUE SHIELD

## 2023-03-30 DIAGNOSIS — K429 Umbilical hernia without obstruction or gangrene: Secondary | ICD-10-CM

## 2023-03-30 DIAGNOSIS — Z0000 Encounter for general adult medical examination without abnormal findings: Secondary | ICD-10-CM

## 2023-03-30 DIAGNOSIS — Z23 Encounter for immunization: Secondary | ICD-10-CM

## 2023-03-30 MED ORDER — ZOSTER VAC RECOMB ADJUVANTED 50 MCG/0.5ML IM SUSR
.5 mL | Freq: Once | INTRAMUSCULAR | 1 refills | Status: AC
Start: 2023-03-30 — End: ?

## 2023-03-30 NOTE — Patient Instructions
It is unusual for the Lipitor (atorvastatin) to cause stomach ache.  I recommend restarting this.  I have sent the prescription to the pharmacy again.  Please take each night, and follow up with you new doctor in the next 2-3 months to recheck labs.    Dr Leroy Sea office is here:  [facility] ~ [address] [place], [address], [place], [place] [address]  [phone]    Please stay in touch [phone]!
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
## 2023-04-16 DIAGNOSIS — M1712 Unilateral primary osteoarthritis, left knee: Secondary | ICD-10-CM

## 2023-04-16 NOTE — Progress Notes
RHEUMATOLOGY OUTPATIENT CONSULTATION  PATIENT: Dustin Watts MRN: [id number] DOB: May 28, 1956  REASONS FOR VISIT/CHIEF COMPLAINT:OA      HPI:66 y.o. male former pt of Dr Roselee Nova with generalized OA, OK knees here to establish care.  Hx of epilepsy, pre-diabetic, HTN, HLD, vit D deficiency    Current visit patient reports the followings:-     04/19/2023 visit - pain in L knee, Patient requests joints injection today 04/19/2023  - L knee CSI    12/30/2022 visit - overall doing well - experiencing pain in L knee at night. Patient requests joints injection and aspiration today 12/30/2022  XR last visit Bilateral knees OA left knee effusion     11/28/2022 visit - Patient has not been seen since 10/2019  Larey Seat twice during the pandemic, the most recent 2023 / Pain in both knees, worse in L / Other joints (hands and wrists are ok)- it like it to be drained left knee  03/2022 - Foot cyst removal   Hx of seizure diagnosis, but free from seizures for long time       10/25/2019  - doing well ibuprofen may be 3 times a week left knee bothers walking not bad ''ibuprofen before doing a lot of walking'' no stomach issue aware not to take daily  - lost about 3 lbs doing squat daily 200s and bike      07/2019  - staying home due to covid  - L knee is not bad; occasional small pain with walking ''do I have fluid in my knee''  - Ibuprofen 800 mg; tried not to take too many; 4-5 times a month or so could be more e.g before going out/walking    03/2019  - L knee doing OK; last injection pain after the procedure lasted 1.5 month; now fine; walking fine  - L ankle strain earlier this year not recall splint  - Ibuprofen 800 mg prn BID but not need them; taking oxycodone as needed on average once at night for the chronic LBP    09/2018  - overall good no falls; L knee some swelling and worsening pain wondering if take fluid out or if any fluid inside  - ibuprofen as needed 800 mg 2 times a day only prn for years no problem with food no indigestion  - otherwise doing well      11/2017   -last visit increasing knee pain XR R knee good L knee mild OA   - doing well; no seizure any longer  - joints are not bad; no pain with walking; no need to take ibuprofen regularly; takes infrequently- a day before and on day out e.g going to [place]; requesting refill; no problem no GERD/taking with food    08/2017  -last visit 02/2017 joints are doing fine on prn NSAID/post CVA  -knees are doing not too badly ''only after long walk'' ''described as blown up'', keen to have refill ibuprofen 800 mg PRN given by Dr Roselee Nova in past ''I only take it if I have a long walk to do so taking infrequently''; no stiffness; at times puffy not today; slight worse lately with pain   -other joints are fine  -chronic R sided atrophy since borne Dx cerebral palsy otherwise in good health  -no seizure for some time now      No known history of malar rash, photosensitivity rash, discoid lesions, oral/nasal ulcers,  pleuritic chest pain, seizures/psychosis, Raynaud's, skin tightness, dry eyes/mouth, hair loss or blood clots.  No history of muscle pain or proximal myopathy.  No history for fever, weight loss, loss of appetite, night sweat or lymphadenopathy.  No history of skin psoriasis, eye issues, STDs or IBD.    No recent travelling.  No sick contacts.    Prior medication/Joint injections/Immune suppressant therapy:  None    PMHx:  Past Medical History:   Diagnosis Date    Acne     Arthritis     Cerebral palsy (HCC/RAF)     Eczema     Hypertension     ''White coat syndrome''    Impaired glucose tolerance     Low vitamin D level     obese     Seizure (HCC/RAF) 1973    chronically on Dilantin, no seizure in 25 years, followed by Neurologist (Dr. Wiliam Ke) at [facility]    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)     R foot s/p surgical removal    Shortening of arm, congenital     right arm       PSHx:  Past Surgical History:   Procedure Laterality Date    rectal abscess  2010    surgery done at [facility]       Meds:    Medications that the patient states to be currently taking   Medication Sig    acetaminophen 325 mg tablet Take 2 tablets (650 mg total) by mouth every six (6) hours as needed (mild pain).    aspirin (ASPIRIN LOW DOSE) 81 mg EC tablet Take 1 tablet (81 mg total) by mouth daily.    benzoyl peroxide-erythromycin 5-3% gel Apply to acne once daily..    Calcium Carb-Cholecalciferol (OYSTER SHELL CALCIUM W/D) 500-200 MG-UNIT TABS     Calcium Carb-Cholecalciferol 500-10 MG-MCG CHEW TAKE 1 CHEWABLE BY MOUTH THREE TIMES DAILY    diclofenac Sodium (VOLTAREN) 1% gel     diclofenac Sodium 1% gel Apply 2 g topically four (4) times daily as needed.    DILANTIN 100 MG ER capsule TAKE 4 CAPSULES ORALLY AT BEDTIME    docusate 100 mg capsule     ibuprofen 600 mg tablet Take 1 tablet (600 mg total) by mouth daily as needed.    naproxen 500 mg tablet Take 1 tablet (500 mg total) by mouth two (2) times daily.    oxyCODONE 5 mg tablet Take 1-2 tablets (5-10 mg total) by mouth every four (4) hours as needed for Moderate Pain (Pain Scale 4-6) or Severe Pain (Pain Scale 7-10). Max Daily Amount: 60 mg    RETIN-A MICRO PUMP 0.04 % gel Apply topically at bedtime.    sulfacetamide-sulfur 9-4.5 % LIQD liquid Apply to face twice daily.    Tretinoin Microsphere (RETIN-A MICRO PUMP) 0.06 % GEL APPLY A PEA SIZED AMOUNT TOPICALLY TO AFFECTED AREA OF FACE NIGHTLY.     Current Facility-Administered Medications for the 04/19/23 encounter (Office Visit) with Liana Gerold, MD, MS   Medication    lidocaine PF 1% inj 2 mL    triamcinolone acetonide 40 mg/mL inj 40 mg     SocHx:  No T/E/D use  Works in Loss adjuster, chartered; single    FamHx:  There is no family history of autoimmune/rheumatic disease and psoriasis.    Physical Exam:    04/19/2023  BP 134/84 (BP Location: Left arm, Patient Position: Sitting, Cuff Size: Large)  ~ Pulse 78  ~ Temp 36.5 ?C (97.7 ?F) (Forehead)  ~ Resp 18  ~ Wt (!) 231  lb (104.8 kg)  ~ SpO2 98%  ~ BMI 31.33 kg/m?     Pain Information (Last Filed)       Score Location Comments Edu?      8 Knee None None              NAD  No ocular injection  No alopecia  OP clear  No LAD  CTAB  RRR  S/NT/ND  No C/C/E  No rashes  No telangectasia/discoloration/calcinosis/sclerodactyly  No tender joints  No swollen joints  L knee crepitus+, small effusion, no warmth   No enthesitis  No dactylitis  No myofascial pain  Neuro grossly intact except R extremities atrophy  Peripheral pulses intact      Labs:  Lab Results   Component Value Date    WBC 4.32 03/20/2022    HGB 14.5 03/20/2022    HCT 43.4 03/20/2022    PLT 198 03/20/2022    NEUTABS 2.52 03/20/2022    LYMPHABS 1.38 03/20/2022     Lab Results   Component Value Date    GLUCOSE 99 10/09/2022    GFREST >89 04/18/2014    CREAT 0.56 (L) 10/09/2022    CALCIUM 9.7 10/09/2022    TOTPRO 8.0 10/09/2022    ALBUMIN 5.2 (H) 10/09/2022    AST 33 10/09/2022    ALT 21 10/09/2022    ALKPHOS 112 10/09/2022     @RRCOAGS @  Lab Results   Component Value Date    TSH 1.5 01/21/2017       Rheum labs:  Lab Results   Component Value Date    SRWEST 13 (H) 05/27/2017    SRWEST 15 (H) 11/28/2014     No results found for: ''CARDLIPIGA'', ''CARDLIPIGG'', ''CARDLIPIGM'', ''ANAAB'', ''ANAABTITER'', ''B2GLYPROIGA'', ''B2GLYPROIGG'', ''B2GLYPROIGM'', ''DRVVT'', ''RNPAB'', ''SMAB'', ''SSA''  No results found for: ''C3'', ''C4'', ''DSDNAAB'', ''NDNAABIFA''  No results found for: ''CANCA'', ''PANCA'', ''PROT3AB'', ''MPOAB''  Lab Results   Component Value Date    CKTOT 68 04/23/2008    URICACID 3.6 11/28/2014     Lab Results   Component Value Date    VITD25OH 35 10/09/2022    VITD25OH 37 09/30/2021     UA:   Lab Results   Component Value Date    PROTCLUR Trace 04/23/2008    BLDUR Negative 04/23/2008    LEUKESTUR Negative 04/23/2008    RBCSUR 5 04/23/2008    WBCSUR 3 04/23/2008       Studies: [x] reviewed [] ordered [] independently reviewed.  Component      Latest Ref Rng & Units 09/16/2018   Segmented Neutrophil      No Reference Range % 1   Lymphocyte      No Reference Range % 1   Monocyte      No Reference Range % 98   Total Cells      cells 100   Fluid Appearance       Viscoid . . .   RBC Count,Joint Fl      No Reference Range /cmm <1,000   WBC Count,Joint Fl      No Reference Range /cmm 282   Crystals,Fluid #1      No Crystals Seen No Crystals Seen       Imaging:    XR Knees B/L 11/2022:   R- IMPRESSION: The previously noted material was extrinsic about the right knee. There is mild right and moderate left knee osteoarthritis.  L - IMPRESSION:  1. Limited examination due to overlying material. Rectangular density projecting over the suprapatellar  recess on the lateral view of the right knee may be external. The patient will be called back to the department for repeat frontal and lateral views   of the right knee.  2. There is progression of left knee osteoarthritis, now moderate.     Knees XR 08/2017   Mild osteoarthritis of the left knee.  No significant arthritis of the right knee.    Knees 2007 normal     ASSESSMENT AND PLAN  Dustin Watts is a 67 y.o. male     # Knees OA stable on prn ibuprofen 800 mg as needed; no GERD; pt takes rarely; recent slight worsening with effusion in L knee s/p CSI 2019- synovial fluid no crystals    12/30/2022 - pain in L knee at night and feels there is fluid - aspiration and CSI of L knee today XR Knees 11/2022 shows mild osteoarthritis of R knee and moderate osteoarthritis of L knee  5 cc fluid obtained no crystals non-inflammatory    # Injection history left knee  L knee CSI  04/19/2023  L knee aspiration and CSI 12/2022   Bilateral CSI 09/2018    Hx of epilepsy stable   pre-diabetic, HTN, HLD  Hx vit D deficiency WNL 02/2017  Overweight BMI 04/19/2023- 31    RECOMMENDATIONS    L knee aspiration and CSI today 04/19/2023   Follow up in 3 months 07/2023 - potential L knee injection - 40 minutes   Weight reduction  Keep minimum Ibuprofen 400 mg qday PRN-with food    RTC 12 weeks or sooner as needed.  CC note to PCP/referred physician      Orders Placed This Encounter    Inject/Drain Knee    lidocaine PF 1% inj 2 mL    triamcinolone acetonide 40 mg/mL inj 40 mg          Thank you for allowing me to participate in this patient's care.  If I can be of further service, please contact my office at  [phone].  [x]  >50%  of this * minute visit was spent in face-to-face evaluation and counseling the patient.  The above plan of care, diagnosis, orders, and follow-up were discussed with the patient. Questions related to this recommended plan of care were answered  []  Immunosuppression drugs changed. [x]  Risks/benefits/toxicities of meds discussed.   []  Reviewed and summarized old records. []  Requested outside medical records.  []  Discussed with other providers of complex medical conditions and management plans outlined above    Liana Gerold MD Rheumatology Division, [place] [phone])    Attestation:  Scribe Signature:  I, Mia Danielle Celestin, have scribed for Dr. Liana Gerold with the documentation for Dustin Watts on 04/19/2023 at 10:19 AM.    Physician Signature:  Dr.Shamyia Grandpre 04/19/2023 10:19 AM     I have reviewed this note, scribed by Jerilee Hoh Celestin, and attest that it is an accurate representation of my H & P and other events of the outpatient visit except if otherwise noted.

## 2023-04-17 ENCOUNTER — Ambulatory Visit: Payer: BLUE CROSS/BLUE SHIELD

## 2023-04-19 ENCOUNTER — Ambulatory Visit: Payer: BLUE CROSS/BLUE SHIELD

## 2023-04-19 MED ORDER — IBUPROFEN 400 MG PO TABS
400 mg | ORAL_TABLET | Freq: Every day | ORAL | 0 refills | Status: AC | PRN
Start: 2023-04-19 — End: ?

## 2023-04-19 MED ADMIN — LIDOCAINE HCL (PF) 1 % IJ SOLN: 2 mL | INTRA_ARTICULAR | @ 18:00:00 | Stop: 2023-04-19 | NDC 63323049227

## 2023-04-19 MED ADMIN — TRIAMCINOLONE ACETONIDE 40 MG/ML IJ SUSP: 40 mg | INTRA_ARTICULAR | @ 18:00:00 | Stop: 2023-04-19 | NDC 00703024101

## 2023-04-19 NOTE — Procedures
Procedure Note:  L knee aspiration & steroid injection  Physician performing injection: Liana Gerold, MD, Rheumatology      The risks, benefits, and alternatives of the procedure were explained, and verbal consent was obtained.  The risks and benefits of the injection were discussed with the patient including but not limited to the risk of allergic reactions, infection, flare reaction, damage to local nerves, tendons or vessels, skin color changes (usually hyperpigmentation), local fat atrophy, the risk of blood sugar elevations and a hyperactivity response. The patient understands these risks elected to proceed.    Area over medial aspect of the L knee was prepped with betadyne and numbed with 1% xylocaine. 22-gauge needle was inserted, and Kenalog 40mg  + 4cc 1% xylocaine were injected into the left knee. Patient tolerated procedure well without any immediate complications. Patient was instructed to monitor for s/s of reaction to steroid and infection.  ED precaution discussed.  Ice applied immediately at site of injection.     15 cc of clear synovial fluid was removed prior to the injection.

## 2023-04-19 NOTE — Patient Instructions
L Knee Aspiration & Steroid Injection 04/19/2023     Please ice injection site few times today and tomorrow, about 10-15 minutes. Please do not overuse the injected joint/area for 48 hours after injection. Please call me for any signs of infection, such as fever, chills, local warmth, redness, and/or pain.  Please keep your appointments. Thank you!     Ibuprofen 800 mg refilled 04/19/2023     Follow up in 3 months; repeat L knee CSI

## 2023-04-27 ENCOUNTER — Telehealth: Payer: MEDICAID

## 2023-04-27 NOTE — Telephone Encounter
Call Back Request      Reason for call back: patient would  like to know if office is able to provide ''appointment card or proof of appointment''. May office mail it to patient if there is that type of appt reminder. Please call back to assist, thank you.   Cbn: [phone]  Any Symptoms:  []  Yes  [x]  No      If yes, what symptoms are you experiencing:    Duration of symptoms (how long):    Have you taken medication for symptoms (OTC or Rx):      If call was taken outside of clinic hours:    [] Patient or caller has been notified that this message was sent outside of normal clinic hours.     [] Patient or caller has been warm transferred to the physician's answering service. If applicable, patient or caller informed to please call us back if symptoms progress.  Patient or caller has been notified of the turnaround time of 1-2 business day(s).

## 2023-04-27 NOTE — Telephone Encounter
I left a voicemail for patient advising of below, if patient calls back please transfer to me.

## 2023-04-27 NOTE — Telephone Encounter
Patient states Red Flag Symptoms  Symptoms: Rash or Redness on One Body Area Only, Bruises - Not From Injury, Injection Reactions - Caller Reports  Outcome: Urgent Call - Do NOT Schedule Appointment  Reason: Painful that started in the past 24 hours      Additional Details:   Call warm transferred to PDL: [x]  Yes  []  No    Call Received by Practice Representative:  Hanaly  If call not answered/not accepted, call received by Patient Services Representative:

## 2023-04-27 NOTE — Telephone Encounter
Per patient he has been experiencing redness, bruising and pain on his injection site. He states the bruise is getting a little bigger but declines having a fever. He wanted to make you aware and would like to know if this is normal or if there is anything he should do, please advise

## 2023-04-28 NOTE — Telephone Encounter
Called, no answer.  Left a message.  Letter can be given on the day of the appt as proof that patient was seen.

## 2023-07-01 MED ORDER — FUROSEMIDE 20 MG PO TABS
20 mg | ORAL_TABLET | Freq: Every day | ORAL | 2 refills | Status: AC
Start: 2023-07-01 — End: ?

## 2023-07-21 ENCOUNTER — Telehealth: Payer: BLUE CROSS/BLUE SHIELD

## 2023-07-21 NOTE — Telephone Encounter
Called pt to confirm appt for 9/16

## 2023-07-26 ENCOUNTER — Inpatient Hospital Stay: Payer: BLUE CROSS/BLUE SHIELD

## 2023-07-26 ENCOUNTER — Ambulatory Visit: Payer: BLUE CROSS/BLUE SHIELD

## 2023-07-26 DIAGNOSIS — Z87898 Personal history of other specified conditions: Secondary | ICD-10-CM

## 2023-07-26 DIAGNOSIS — S99922A Unspecified injury of left foot, initial encounter: Secondary | ICD-10-CM

## 2023-07-26 DIAGNOSIS — D649 Anemia, unspecified: Secondary | ICD-10-CM

## 2023-07-26 DIAGNOSIS — Z0000 Encounter for general adult medical examination without abnormal findings: Secondary | ICD-10-CM

## 2023-07-26 LAB — CBC: RED BLOOD CELL COUNT: 3.94 x10E6/uL — ABNORMAL LOW (ref 4.41–5.95)

## 2023-07-26 LAB — Comprehensive Metabolic Panel
ALBUMIN: 4.5 g/dL (ref 3.9–5.0)
CALCIUM: 9.1 mg/dL (ref 8.6–10.4)

## 2023-07-26 LAB — Ferritin: FERRITIN: 92 ng/mL (ref 8–350)

## 2023-07-26 LAB — Hgb A1c: HGB A1C: 5.8 — ABNORMAL HIGH (ref <5.7)

## 2023-07-26 LAB — Lipid Panel: TRIGLYCERIDES: 390 mg/dL — ABNORMAL HIGH (ref >40–<150)

## 2023-07-26 LAB — Iron & Iron Binding Capacity: % SATURATION: 38 (ref 262–502)

## 2023-07-26 MED ORDER — DILANTIN 100 MG PO CAPS
400 mg | ORAL_CAPSULE | Freq: Every evening | ORAL | 3 refills | 9.00000 days | Status: AC
Start: 2023-07-26 — End: ?

## 2023-07-26 NOTE — Progress Notes
[facility] Primary Care  Internal Medicine-Pediatrics    PATIENT: Dustin Watts  MRN: [id number]  DOB: 1956-06-29  DATE OF SERVICE: 07/26/2023  CHIEF COMPLAINT:   Chief Complaint   Patient presents with    Annual Exam        HPI   Dustin Watts is a 67 y.o. male who presents for annual exam.    Acute Concerns:    Left Foot Pain  - medial side of foot  - thinks he hit his foot on something in his home while walking around in the middle of the night  - this was last month   - wearing compression sock   - history difficult to obtain    Healthcare Maintenance  - Chronic Conditions:  - hx of epilepsy: On Dilatin 400 mg nightly. Last seizure was >40 years ago.  - hx of cerebral palsy:   - hx of knee OA: follows with Rheumatology. Got steroid injection around 3 months ago. Controls pain with Tylenol, voltaren gel, ibuprofen,   - HTN: White-coat HTN  - HLD: not on statin  - hx of vitamin D deficiency  - hx of acne: on benzoyl peroxide-erythromycin 5-3% daily to lesions, sulfacetamide-sulfur solution and retin-A at 0.06% nighttime.  - HCM: on aspirin 81 mg daily for preventative purposes, Calcium-Vit D,   - Vaccines:   [x]  Covid vaccinated x3 - including 2023 monovalent    []  Flu vaccinated (if applicable)   [x]  Tetanus Booster w/in last 10 years   []  Shingles completed   []  PNA vaccine completed  - Screenings:  [x]  Screened for HIV   [x]  Screened for Hep B   [x]  Screened for Hep C   [x]  Colon Cancer Screening (if applicable) 04/2023 - FIT neg   []  Breast Cancer Screening (if applicable) n/a   []  DEXA screening (if applicable) n/a   []  Other screens:  PMH     Past Medical History:   Diagnosis Date    Acne     Arthritis     Cerebral palsy (HCC/RAF)     Eczema     Hypertension     ''White coat syndrome''    Impaired glucose tolerance     Low vitamin D level     obese     Seizure (HCC/RAF) 1973    chronically on Dilantin, no seizure in 25 years, followed by Neurologist (Dr. Wiliam Ke) at [facility]    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)     R foot s/p surgical removal    Shortening of arm, congenital     right arm       PSxH     Past Surgical History:   Procedure Laterality Date    rectal abscess  2010    surgery done at [facility]     Family History   Problem Relation Age of Onset    Heart disease Mother     Diabetes Mother     Kidney disease Mother     Stroke Brother     Colon cancer Brother 50    Prostate cancer Neg Hx     Ovarian cancer Neg Hx     Breast cancer Neg Hx     Hyperlipidemia Neg Hx     Hypertension Neg Hx     Anesthesia problems Neg Hx     Malignant hyperthermia Neg Hx     Hypotension Neg Hx  Malignant hypertension Neg Hx        SocHx     Social History     Tobacco Use    Smoking status: Never     Passive exposure: Never    Smokeless tobacco: Never   Vaping Use    Vaping status: Never Used   Substance Use Topics    Alcohol use: No    Drug use: No       ALL     Allergies   Allergen Reactions    Topiramate      Eye swelling    Sulfa Antibiotics Angioedema     No anaphylaxis    Amlodipine Other (See Comments)     Lip swelling without anaphylaxis    Carvedilol Angioedema     Lip swelling without anaphylaxis    Losartan Rash     No hive, no anaphylaxis, unclear if truly has rash from this medicine    Penicillins Rash     ''bumps on body''       Meds     Outpatient Medications Prior to Visit   Medication Sig    acetaminophen 325 mg tablet Take 2 tablets (650 mg total) by mouth every six (6) hours as needed (mild pain).    aspirin (ASPIRIN LOW DOSE) 81 mg EC tablet Take 1 tablet (81 mg total) by mouth daily.    benzoyl peroxide-erythromycin 5-3% gel Apply to acne once daily..    Calcium Carb-Cholecalciferol (OYSTER SHELL CALCIUM W/D) 500-200 MG-UNIT TABS     Calcium Carb-Cholecalciferol 500-10 MG-MCG CHEW TAKE 1 CHEWABLE BY MOUTH THREE TIMES DAILY    diclofenac Sodium (VOLTAREN) 1% gel     diclofenac Sodium 1% gel Apply 2 g topically four (4) times daily as needed.    DILANTIN 100 MG ER capsule TAKE 4 CAPSULES ORALLY AT BEDTIME    docusate 100 mg capsule     ibuprofen 400 mg tablet Take 1 tablet (400 mg total) by mouth daily as needed With food.    ibuprofen 600 mg tablet Take 1 tablet (600 mg total) by mouth daily as needed.    naproxen 500 mg tablet Take 1 tablet (500 mg total) by mouth two (2) times daily.    oxyCODONE 5 mg tablet Take 1-2 tablets (5-10 mg total) by mouth every four (4) hours as needed for Moderate Pain (Pain Scale 4-6) or Severe Pain (Pain Scale 7-10). Max Daily Amount: 60 mg    RETIN-A MICRO PUMP 0.04 % gel Apply topically at bedtime.    sulfacetamide-sulfur 9-4.5 % LIQD liquid Apply to face twice daily.    Tretinoin Microsphere (RETIN-A MICRO PUMP) 0.06 % GEL APPLY A PEA SIZED AMOUNT TOPICALLY TO AFFECTED AREA OF FACE NIGHTLY.    atorvastatin 10 mg tablet Take 1 tablet (10 mg total) by mouth at bedtime. (Patient not taking: Reported on 07/26/2023.)    FUROSEMIDE 20 mg tablet take 1 tablet by mouth every day (Patient not taking: Reported on 07/26/2023.)    naproxen 500 mg tablet Take 1 tablet (500 mg total) by mouth two (2) times daily. (Patient not taking: Reported on 07/26/2023.)     No facility-administered medications prior to visit.       ROS   [x]  10-point ROS reviewed: normal unless stated in HPI              PHYSICAL EXAM     Last Recorded Vital Signs:    07/26/23 0946   BP: 150/85   Pulse:  Resp:    Temp:    SpO2:      Body mass index is 31 kg/m?Marland Kitchen    General - Awake and alert, NAD  CVS - S1 and S2 heard, RRR, no murmurs  Pulm - Lungs CTAB, Good respiratory effort  GI - BS+, Nontender to palpation  GU - pt deferred  MSK - grossly normal appearance of the left foot  Eyes - Clear conjunctiva  ENT - Oropharynx clear without exudate, Nares clear, Tympanic membranes normal  Neck - Supple with no lymphadenopathy    LABS/STUDIES   I have:   [x]  Reviewed/ordered []  1 []  2 [x]  ? 3 unique laboratory, radiology, and/or diagnostic tests noted below    []  Reviewed []  1 []  2 []  ? 3 prior external notes and incorporated into patient assessment    []  Discussed management or test interpretation with external provider(s) as noted      A&P   FRANCISCA SHIVELY is a a 67 y.o. male presenting for the following:    1. Injury of left foot, initial encounter  - ordered radiograph to eval for fracture    2. History of seizures  - Referral to Neurology  - refilled dilantin   - advised to f/u with Neurology for future management    3. Healthcare maintenance  - Reviewed/Updated histories and med list.  - BP elevated (hx of white coat HTN), BMI noted, Exam unremarkable.  - Routine/Screening Labs:   - CBC; Future  - Comprehensive Metabolic Panel; Future  - Hgb A1c; Future  - Lipid Panel; Future  - Phenytoin; Future  - Vaccines:   - will address in future visit  - Screenings:   - utd  - RTC in 1 year for annual or sooner if needed     The above recommendations were discussed with the patient. The patient had all questions answered satisfactorily and is in agreement with this recommended plan of care.    Author:    Sena Hitch Chelbi Herber   07/26/2023   9:48 AM

## 2023-07-27 LAB — Phenytoin: PHENYTOIN: 9 ug/mL — ABNORMAL LOW (ref 10–20)

## 2023-07-27 LAB — Vitamin B12: VITAMIN B12: 2957 pg/mL — ABNORMAL HIGH (ref 254–1060)

## 2023-07-27 LAB — Folate,Serum: FOLATE,SERUM: 35.7 ng/mL — ABNORMAL HIGH (ref 8.1–30.4)

## 2023-07-29 DIAGNOSIS — M1712 Unilateral primary osteoarthritis, left knee: Secondary | ICD-10-CM

## 2023-07-29 NOTE — Progress Notes
RHEUMATOLOGY OUTPATIENT CONSULTATION  PATIENT: Dustin Watts MRN: [id number] DOB: May 13, 1956  REASONS FOR VISIT/CHIEF COMPLAINT:OA      HPI:67 y.o. male former pt of Dr Roselee Nova with generalized OA, OK knees here to establish care.  Hx of epilepsy, pre-diabetic, HTN, HLD, vit D deficiency    Current visit patient reports the followings:-     08/09/2023 visit - Persistent L knee pain, pain improved after last injection, had slight bruising following. Patient requests joints injection today 08/09/2023  L knee CSI      04/19/2023 visit - pain in L knee, Patient requests joints injection today 04/19/2023  - L knee CSI    12/30/2022 visit - overall doing well - experiencing pain in L knee at night. Patient requests joints injection and aspiration today 12/30/2022  XR last visit Bilateral knees OA left knee effusion     11/28/2022 visit - Patient has not been seen since 10/2019  Larey Seat twice during the pandemic, the most recent 2023 / Pain in both knees, worse in L / Other joints (hands and wrists are ok)- it like it to be drained left knee  03/2022 - Foot cyst removal   Hx of seizure diagnosis, but free from seizures for long time       10/25/2019  - doing well ibuprofen may be 3 times a week left knee bothers walking not bad ''ibuprofen before doing a lot of walking'' no stomach issue aware not to take daily  - lost about 3 lbs doing squat daily 200s and bike      07/2019  - staying home due to covid  - L knee is not bad; occasional small pain with walking ''do I have fluid in my knee''  - Ibuprofen 800 mg; tried not to take too many; 4-5 times a month or so could be more e.g before going out/walking    03/2019  - L knee doing OK; last injection pain after the procedure lasted 1.5 month; now fine; walking fine  - L ankle strain earlier this year not recall splint  - Ibuprofen 800 mg prn BID but not need them; taking oxycodone as needed on average once at night for the chronic LBP    09/2018  - overall good no falls; L knee some swelling and worsening pain wondering if take fluid out or if any fluid inside  - ibuprofen as needed 800 mg 2 times a day only prn for years no problem with food no indigestion  - otherwise doing well      11/2017   -last visit increasing knee pain XR R knee good L knee mild OA   - doing well; no seizure any longer  - joints are not bad; no pain with walking; no need to take ibuprofen regularly; takes infrequently- a day before and on day out e[place]g going [place]; requesting refill; no problem no GERD/taking with food    08/2017  -last visit 02/2017 joints are doing fine on prn NSAID/post CVA  -knees are doing not too badly ''only after long walk'' ''described as blown up'', keen to have refill ibuprofen 800 mg PRN given by Dr Roselee Nova in past ''I only take it if I have a long walk to do so taking infrequently''; no stiffness; at times puffy not today; slight worse lately with pain   -other joints are fine  -chronic R sided atrophy since borne Dx cerebral palsy otherwise in good health  -no seizure for some time now  No known history of malar rash, photosensitivity rash, discoid lesions, oral/nasal ulcers,  pleuritic chest pain, seizures/psychosis, Raynaud's, skin tightness, dry eyes/mouth, hair loss or blood clots.  No history of muscle pain or proximal myopathy.  No history for fever, weight loss, loss of appetite, night sweat or lymphadenopathy.  No history of skin psoriasis, eye issues, STDs or IBD.    No recent travelling.  No sick contacts.    Prior medication/Joint injections/Immune suppressant therapy:  None    PMHx:  Past Medical History:   Diagnosis Date    Acne     Arthritis     Cerebral palsy (HCC/RAF)     Eczema     Hypertension     ''White coat syndrome''    Impaired glucose tolerance     Low vitamin D level     obese     Seizure (HCC/RAF) 1973    chronically on Dilantin, no seizure in 25 years, followed by Neurologist (Dr. Wiliam Ke) at [facility]    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)     R foot s/p surgical removal    Shortening of arm, congenital     right arm   pre-diabetic, HTN, HLD  Hx vit D deficiency WNL 02/2017  Overweight BMI 04/19/2023- 31    PSHx:  Past Surgical History:   Procedure Laterality Date    rectal abscess  2010    surgery done at [facility]    Seroma Removal      s/p removal of seroma on right foot after right foot trauma       Meds:    Outpatient Medications Prior to Visit   Medication Sig    acetaminophen 325 mg tablet Take 2 tablets (650 mg total) by mouth every six (6) hours as needed (mild pain).    aspirin (ASPIRIN LOW DOSE) 81 mg EC tablet Take 1 tablet (81 mg total) by mouth daily.    atorvastatin 10 mg tablet Take 1 tablet (10 mg total) by mouth at bedtime.    benzoyl peroxide-erythromycin 5-3% gel Apply to acne once daily..    Calcium Carb-Cholecalciferol 500-10 MG-MCG CHEW TAKE 1 CHEWABLE BY MOUTH THREE TIMES DAILY    diclofenac Sodium 1% gel Apply 2 g topically four (4) times daily as needed.    DILANTIN 100 MG ER capsule Take 4 capsules (400 mg total) by mouth at bedtime.    docusate 100 mg capsule     ibuprofen 400 mg tablet Take 1 tablet (400 mg total) by mouth daily as needed With food.    ibuprofen 600 mg tablet Take 1 tablet (600 mg total) by mouth daily as needed.    naproxen 500 mg tablet Take 1 tablet (500 mg total) by mouth two (2) times daily.    oxyCODONE 5 mg tablet Take 1-2 tablets (5-10 mg total) by mouth every four (4) hours as needed for Moderate Pain (Pain Scale 4-6) or Severe Pain (Pain Scale 7-10). Max Daily Amount: 60 mg    sulfacetamide-sulfur 9-4.5 % LIQD liquid Apply to face twice daily.    Tretinoin Microsphere (RETIN-A MICRO PUMP) 0.06 % GEL APPLY A PEA SIZED AMOUNT TOPICALLY TO AFFECTED AREA OF FACE NIGHTLY.     No facility-administered medications prior to visit.         SocHx:  No T/E/D use  Works in Loss adjuster, chartered; single    FamHx:  There is no family history of autoimmune/rheumatic disease and psoriasis.    Physical  Exam:    08/09/2023 BP 124/79  ~ Pulse 72  ~ Temp (!) 35.4 ?C (95.8 ?F) (Forehead)  ~ Resp 16  ~ Ht 6' (1.829 m)  ~ Wt (!) 227 lb (103 kg)  ~ SpO2 99%  ~ BMI 30.79 kg/m?     Pain Information       No pain information on file              NAD  No ocular injection  No alopecia  OP clear  No LAD  CTAB  RRR  S/NT/ND  No C/C/E  No rashes  No telangectasia/discoloration/calcinosis/sclerodactyly  No tender joints  No swollen joints  L knee crepitus+, small effusion, no warmth   No enthesitis  No dactylitis  No myofascial pain  Neuro grossly intact except R extremities atrophy  Peripheral pulses intact      Labs:  Lab Results   Component Value Date    WBC 4.86 07/26/2023    HGB 13.2 (L) 07/26/2023    HCT 38.9 07/26/2023    PLT 184 07/26/2023    NEUTABS 2.52 03/20/2022    LYMPHABS 1.38 03/20/2022     Lab Results   Component Value Date    GLUCOSE 109 (H) 07/26/2023    GFREST >89 04/18/2014    CREAT 0.58 (L) 07/26/2023    CALCIUM 9.1 07/26/2023    TOTPRO 7.4 07/26/2023    ALBUMIN 4.5 07/26/2023    AST 24 07/26/2023    ALT 29 07/26/2023    ALKPHOS 70 07/26/2023     @RRCOAGS @  Lab Results   Component Value Date    TSH 1.5 01/21/2017       Rheum labs:  Lab Results   Component Value Date    SRWEST 13 (H) 05/27/2017    SRWEST 15 (H) 11/28/2014     No results found for: ''CARDLIPIGA'', ''CARDLIPIGG'', ''CARDLIPIGM'', ''ANAAB'', ''ANAABTITER'', ''B2GLYPROIGA'', ''B2GLYPROIGG'', ''B2GLYPROIGM'', ''DRVVT'', ''RNPAB'', ''SMAB'', ''SSA''  No results found for: ''C3'', ''C4'', ''DSDNAAB'', ''NDNAABIFA''  No results found for: ''CANCA'', ''PANCA'', ''PROT3AB'', ''MPOAB''  Lab Results   Component Value Date    CKTOT 68 04/23/2008    URICACID 3.6 11/28/2014     Lab Results   Component Value Date    VITD25OH 35 10/09/2022    VITD25OH 37 09/30/2021     UA:   Lab Results   Component Value Date    PROTCLUR Trace 04/23/2008    BLDUR Negative 04/23/2008    LEUKESTUR Negative 04/23/2008    RBCSUR 5 04/23/2008    WBCSUR 3 04/23/2008       Studies: [x] reviewed [] ordered [] independently reviewed.  Component Latest Ref Rng & Units 09/16/2018   Segmented Neutrophil      No Reference Range % 1   Lymphocyte      No Reference Range % 1   Monocyte      No Reference Range % 98   Total Cells      cells 100   Fluid Appearance       Viscoid . . .   RBC Count,Joint Fl      No Reference Range /cmm <1,000   WBC Count,Joint Fl      No Reference Range /cmm 282   Crystals,Fluid #1      No Crystals Seen No Crystals Seen       Imaging:    XR Knees B/L 11/2022:   R- IMPRESSION: The previously noted material was extrinsic about the right knee. There is mild right and moderate  left knee osteoarthritis.  L - IMPRESSION:  1. Limited examination due to overlying material. Rectangular density projecting over the suprapatellar recess on the lateral view of the right knee may be external. The patient will be called back to the department for repeat frontal and lateral views   of the right knee.  2. There is progression of left knee osteoarthritis, now moderate.     Knees XR 08/2017   Mild osteoarthritis of the left knee.  No significant arthritis of the right knee.    Knees 2007 normal     ASSESSMENT AND PLAN  HAWTHORNE DAY is a 67 y.o. male     # Knees OA stable on prn ibuprofen 800 mg as needed; no GERD; pt takes rarely; recent slight worsening with effusion in L knee s/p CSI 2019- synovial fluid no crystals    12/30/2022 - pain in L knee at night and feels there is fluid - aspiration and CSI of L knee today XR Knees 11/2022 shows mild osteoarthritis of R knee and moderate osteoarthritis of L knee  5 cc fluid obtained no crystals non-inflammatory    # Injection history left knee  L knee CSI  04/19/2023; 08/09/2023   L knee aspiration and CSI 12/2022   Bilateral CSI 09/2018    # Hx of epilepsy stable     RECOMMENDATIONS    L knee CSI today 08/09/2023     Weight reduction  Keep minimum Ibuprofen 400 mg qday PRN-with food    Follow up in 3 months 07/2023 - potential L knee injection - 40 minutes     Orders Placed This Encounter    Inject/Drain Knee triamcinolone acetonide 40 mg/mL inj 40 mg    lidocaine PF 1% inj 2 mL        RTC 12 weeks or sooner as needed.  CC note to PCP/referred physician    Procedure Note:  L knee steroid injection  Physician performing injection: Liana Gerold, MD, Rheumatology      The risks, benefits, and alternatives of the procedure were explained, and verbal consent was obtained.  The risks and benefits of the injection were discussed with the patient including but not limited to the risk of allergic reactions, infection, flare reaction, damage to local nerves, tendons or vessels, skin color changes (usually hyperpigmentation), local fat atrophy, the risk of blood sugar elevations and a hyperactivity response. The patient understands these risks elected to proceed.    Area over medial aspect of the L knees was prepped with betadyne and numbed with 1% xylocaine. 22-gauge needle was inserted, and Kenalog 40mg  + 4cc 1% xylocaine were injected into the left knee. Patient tolerated procedure well without any immediate complications. Patient was instructed to monitor for s/s of reaction to steroid and infection.  ED precaution discussed.  Ice applied immediately at site of injection.      Problem is:  [x]  Chronic unstable  []  Chronic stable   []  New minor/self-limited problem  []  Acute uncomplicated illness/injury   []  Acute systemic illness or complicated injury   []  New problem with uncertain diagnosis  []  New problem that poses threat to life or bodily function    RISK OF COMPLICATION   This writer has deemed the above diagnoses to have a risk of complication, morbidity or mortality of:   []  Minimal  [x]  Low  []  Moderate  []  Severe     BILLING BY TIME   I spent  minutes for the items checked below  on the day of service 08/09/2023;>50% spent in face to face evaluation and counseling the patient.  The above plan of care, diagnosis, orders, and follow-up were discussed with the patient. Questions related to this recommended plan of care were answered    [x]  Preparing to see the patient (e.g., review of tests) Reviewed and summarized old records.   [x]  Performing a medically appropriate examination and/or evaluation   [x]  Independently interpreting results and communicating results to patient/ family/caregiver  [x]  Counseling and educating the patient/family/caregiver   [x]  Ordering medications, tests, or procedures  [x]  Risks/benefits/toxicities of meds discussed.   [x]  Referring and communicating with other healthcare professionals (when not separately reported) -Discussed with other providers of complex medical conditions and management plans outlined above  [x]  Documenting clinical information in the EHR  []  Immunosuppression drugs changed.   []  Requested outside medical records.    Liana Gerold MD Rheumatology Division, [place] [phone])    Attestation:  Scribe Signature:  I, Mia Danielle Celestin, have scribed for Dr. Liana Gerold with the documentation for DOMONICK SITTNER on 08/09/2023 at 9:52 AM.    Physician Signature:  Dr.Lyanne Kates 08/09/2023 9:52 AM     I have reviewed this note, scribed by Jerilee Hoh Celestin, and attest that it is an accurate representation of my H & P and other events of the outpatient visit except if otherwise noted.

## 2023-07-30 ENCOUNTER — Ambulatory Visit: Payer: MEDICAID

## 2023-07-30 DIAGNOSIS — M79672 Pain in left foot: Secondary | ICD-10-CM

## 2023-07-30 DIAGNOSIS — E782 Mixed hyperlipidemia: Secondary | ICD-10-CM

## 2023-07-30 DIAGNOSIS — D649 Anemia, unspecified: Secondary | ICD-10-CM

## 2023-07-30 MED ORDER — ATORVASTATIN CALCIUM 10 MG PO TABS
10 mg | ORAL_TABLET | Freq: Every evening | ORAL | 3 refills | Status: AC
Start: 2023-07-30 — End: ?

## 2023-08-04 ENCOUNTER — Telehealth: Payer: MEDICAID

## 2023-08-04 NOTE — Telephone Encounter
Spoke with patient and gave him the number for Hematology

## 2023-08-04 NOTE — Telephone Encounter
Call Back Request      Reason for call back: Patient states that the number provided to him to call to hematologist is disconnected. Please call patient and provide a good contact number for him.     Any Symptoms:  []  Yes  [x]  No      If yes, what symptoms are you experiencing:    Duration of symptoms (how long):    Have you taken medication for symptoms (OTC or Rx):      If call was taken outside of clinic hours:    [] Patient or caller has been notified that this message was sent outside of normal clinic hours.     [] Patient or caller has been warm transferred to the physician's answering service. If applicable, patient or caller informed to please call us back if symptoms progress.  Patient or caller has been notified of the turnaround time of 1-2 business day(s).

## 2023-08-06 ENCOUNTER — Telehealth: Payer: BLUE CROSS/BLUE SHIELD

## 2023-08-06 NOTE — Telephone Encounter
Returned patients call and scheduled with Dr.Mitchell 10/21

## 2023-08-06 NOTE — Telephone Encounter
Call Back Request      Reason for call back: Pt is returning call from [facility] to schedule consult.     Any Symptoms:  []  Yes  [x]  No      If yes, what symptoms are you experiencing:    Duration of symptoms (how long):    Have you taken medication for symptoms (OTC or Rx):      If call was taken outside of clinic hours:    [] Patient or caller has been notified that this message was sent outside of normal clinic hours.     [] Patient or caller has been warm transferred to the physician's answering service. If applicable, patient or caller informed to please call us back if symptoms progress.  Patient or caller has been notified of the turnaround time of 1-2 business day(s).

## 2023-08-09 ENCOUNTER — Ambulatory Visit: Payer: BLUE CROSS/BLUE SHIELD

## 2023-08-09 MED ADMIN — LIDOCAINE HCL (PF) 1 % IJ SOLN: 2 mL | INTRA_ARTICULAR | @ 16:00:00 | Stop: 2023-08-09 | NDC 55150016102

## 2023-08-09 MED ADMIN — TRIAMCINOLONE ACETONIDE 40 MG/ML IJ SUSP: 40 mg | INTRAMUSCULAR | @ 17:00:00 | Stop: 2023-08-09 | NDC 00703024101

## 2023-08-09 NOTE — Procedures
Procedure Note:  L knee steroid injection  Physician performing injection: Arijana Narayan, MD, Rheumatology      The risks, benefits, and alternatives of the procedure were explained, and verbal consent was obtained.  The risks and benefits of the injection were discussed with the patient including but not limited to the risk of allergic reactions, infection, flare reaction, damage to local nerves, tendons or vessels, skin color changes (usually hyperpigmentation), local fat atrophy, the risk of blood sugar elevations and a hyperactivity response. The patient understands these risks elected to proceed.    Area over medial aspect of the L knees was prepped with betadyne and numbed with 1% xylocaine. 22-gauge needle was inserted, and Kenalog 40mg + 4cc 1% xylocaine were injected into the left knee. Patient tolerated procedure well without any immediate complications. Patient was instructed to monitor for s/s of reaction to steroid and infection.  ED precaution discussed.  Ice applied immediately at site of injection.

## 2023-08-09 NOTE — Patient Instructions
L Knee Steroid Injection    Please ice injection site few times today and tomorrow, about 10-15 minutes. Please do not overuse the injected joint/area for 48 hours after injection. Please call me for any signs of infection, such as fever, chills, local warmth, redness, and/or pain.  Please keep your appointments. Thank you!    Follow up in 4 months - repeat L knee steroid injectio (40 min)

## 2023-08-17 ENCOUNTER — Telehealth: Payer: BLUE CROSS/BLUE SHIELD

## 2023-08-17 NOTE — Telephone Encounter
New Neurology Specialty Intake  Reason for visit: referred by PCP   Confirmed Diagnosis YES [x]  History of seizures _________ NO []   Symptoms:  Referred to specific program YES [x]  Epilepsy clinic____ NO []   Is a specific provider being requested? YES []   ____xNO  Have you seen a neurologist before?  NO []   YES [x]             Name of neurologist:Dr. Wiliam Ke, [facility] (records on CC)  Date:  Reason for previous Neurology visit: same Dx  Is the referral in [facility]? YES [x]  NO []   Name of referring physician:Dr. Ryan Clagg   Specialty of referring physician:PCP   Pt aware referral need to be updated to [facility] epilepsy clinic, current referral is to General Neurology  For MS or Stroke - Were MRI Images Received? YES []   NO []   N/A [x]   Patient or caller has been notified of the turnaround time of 7 - 10 business days.

## 2023-08-18 NOTE — Progress Notes
10/[birth year]2KARSEN REASONSAS [birth year]  [id number]        [facility]      Chief Complaint:       History:     Past/Medical/Social/Family History: The patient?s intake sheet, including his/her past medical history, past surgical history, medicines, allergies, social and family history was reviewed by myself and the patient today, these are noted and non-contributory to the ongoing problem.    Allergies: All allergies have been listed by the patient and entered into the clinical summary of the electronic medical record.    Review of Systems: The review of systems as documented today in the medical record is remarkable for the positive orthopaedic problems discussed above and is otherwise non-contributory with respect to Constitutional, ENT, Cardiovascular, GU, Skin, Neurologic, Endocrine, Hematologic, Psychiatric, Gastrointestinal, Respiratory, Eyes and Allergic/Immunologic systems.    Physical Exam:     Constitutional:      The patient?s general appearance is that of a well dressed, well nourished individual in no apparent distress.      The body habitus is normal.    Psychiatric:     Patient is alert and oriented to person, time and place, with a pleasant mood and affect.      Eyes:     Extraocular movements are intact, pupils are symmetric. No conjunctivitis is present; eyelids appear normal.     Neck:     Supple, trachea midline.     Cardiovascular:     Pedal pulses are 2+ bilaterally in the dorsalis pedis and posterior tibial arteries. No cyanosis, clubbing or edema is evident.    Respiratory:     Respirations are regular & unlabored. Respiratory effort is normal with no evidence of abnormal intercostal retraction or excessive use of accessory muscles. No wheezing.    Skin:     No lesions or rash noted.    Neurological:     Sensation intact to light touch. Motor exam is 5/5 throughout bilateral lower extremities.    Musculoskeletal:     Assistive devices:  None        Shoe wear:  Standard      Atrophy (calf):  None      Evidence of neuromuscular disease:  None       Left Right   Erythema:  No No     Edema:  None None     Ecchymosis: No No     Evidence DVT:  None None     CRPS: No Evidence     No Evidence     Range of Motion:       First MTP:     Flexion: 20 20   Extension:  70 70     Ankle joint:     Flexion: 50 50   Extension: 20 20     Subtalar Joint:     Inversion 35 35   Eversion 25 25     Generalized Ligamentous Laxity:  No    Evidence of infection:  No       Skin Lesions:  None suspicious      Ankle Instability:    None      Deformity:  None None     Able to walk: Yes      Tenderness:       Achilles tendon:  No No       Posterior Tibial Tendon: No       No  First MTP Joint:  No No     Anteromedial Calcaneal Tuberosity:  No No     Anterior Tibial Tendon:  No No     Lisfranc Joint:  No No     Lateral Ankle:  No No     Medial Ankle:  No No     Tarsal Tunnel: No No     2nd Webspace: No No   3rd Webspace: No No     Accessory Navicular: No No       Calf circumference: Equal bilaterally.    Strength: 5/5 ankle and knee flexion and extension, ankle inversion and eversion, hip abduction, adduction and flexion extension    Nails:  Onychocryptosis No No   Onychocryptosis No No   Onychocryptosis No No       Radiographs:     Radiographs taken today in our [facility] office and reviewed with patient in detail reveal:     No fracture dislocation or bony pathology    Outside films reviewed today:    Impression Overall:       Plan:   Patient education  RTC  Call or return prior to the next scheduled appointment if problems or questions  All questions answered  Icing  Elevation  Limit activities  Brochures dispensed as needed    I spent total *** minutes formulating todays visit which included:    [] Preparing to see the patient (e.g., review of tests)   [] Obtaining and/or reviewing separately obtained history   [] Performing a medically appropriate examination and/or evaluation   [] Counseling and educating the patient/family/caregiver   [] Documenting clinical information in the electronic or other health record   [] Independently interpreting results (not separately reported) and communicating results to the patient/family/caregiver   [] Care coordination (not separately reported)    Ander Sladede, M.D., F.A.C.S.  Board Certified Orthopedic Surgeon  Fellowship Trained, Medicine and Surgery of the Foot and Ankle

## 2023-08-23 ENCOUNTER — Ambulatory Visit: Payer: MEDICAID

## 2023-08-23 MED ORDER — RETIN-A MICRO PUMP 0.06 % EX GEL
ORAL | 0 refills | 30.00000 days | Status: AC
Start: 2023-08-23 — End: ?

## 2023-08-24 ENCOUNTER — Telehealth: Payer: BLUE CROSS/BLUE SHIELD

## 2023-08-24 MED ORDER — RETIN-A MICRO PUMP 0.06 % EX GEL
ORAL | 0 refills | 30.00000 days | Status: AC
Start: 2023-08-24 — End: ?

## 2023-08-24 NOTE — Telephone Encounter
Done, pt is aware ?

## 2023-08-24 NOTE — Telephone Encounter
Message to Practice/Provider      Message:  please have MD office update referral to ww location hem/onc.  Pt is requesting alt location . Please update referral             Patient or caller has been notified that their message will be reviewed within 1-2 business days.

## 2023-08-25 ENCOUNTER — Telehealth: Payer: BLUE CROSS/BLUE SHIELD

## 2023-08-25 NOTE — Telephone Encounter
Patient has been scheduled

## 2023-08-25 NOTE — Telephone Encounter
I was able to get a hold of the patient after receiving a voicemail from LT  Our supervisor , to re-schedule.    Patient had a bad experience with the call center and refuses any services with [facility].    Thank you,  Caryn Bee [facility] Coordinator / Surgery Scheduler to Dr. Ander Slade  [facility]  [address]  [place], [place]  [address]   [phone]  [phone]

## 2023-08-26 MED ORDER — RETIN-A MICRO PUMP 0.06 % EX GEL
ORAL | 0 refills | 30.00000 days | Status: AC
Start: 2023-08-26 — End: ?

## 2023-08-27 MED ORDER — RETIN-A MICRO PUMP 0.06 % EX GEL
1 | Freq: Every evening | TOPICAL | 0 refills | Status: AC
Start: 2023-08-27 — End: ?

## 2023-08-28 MED ORDER — RETIN-A MICRO PUMP 0.06 % EX GEL
11 refills | Status: AC
Start: 2023-08-28 — End: ?

## 2023-08-30 ENCOUNTER — Ambulatory Visit: Payer: MEDICAID | Attending: Student in an Organized Health Care Education/Training Program

## 2023-08-30 ENCOUNTER — Telehealth: Payer: MEDICAID

## 2023-08-30 NOTE — Telephone Encounter
Call Back Request      Reason for call back: Per patient he cannot make it to his [facility] and cancel and has been waiting for a call back from [place] to know when is the next available appt. Please contact patient for a new consult here in [place]. Referral in [facility]      Thank you       Any Symptoms:  []  Yes  [x]  No      If yes, what symptoms are you experiencing:    Duration of symptoms (how long):    Have you taken medication for symptoms (OTC or Rx):      If call was taken outside of clinic hours:    [] Patient or caller has been notified that this message was sent outside of normal clinic hours.     [] Patient or caller has been warm transferred to the physician's answering service. If applicable, patient or caller informed to please call us back if symptoms progress.  Patient or caller has been notified of the turnaround time of 1-2 business day(s).

## 2023-08-30 NOTE — Consults
PATIENT:  Dustin Watts  MRN:  [id number]  DOB:  [birth year]  DATE OF SERVICE:  08/30/2023    ATTENDING PHYSICIAN: Bethel Born., MD   PRIMARY CARE PROVIDER: Clagg, Sena Hitch., MD      REASON FOR VISIT/CHIEF COMPLAINT: anemia      HEMATOLOGY/ONCOLOGY HISTORY     Dustin Watts is a 67 y.o. male with epilepsy, cerebral palsy, HTN, HLD who presents for anemia    Pt was incidentally found to be anemic on routine labs with his PCP. He was referred to hematology for further workup. No evidence of iron deficiency, folate deficiency, or vitB12 deficiency    INTERVAL HISTORY      REVIEW OF SYSTEMS     Review of Systems: A detailed 14 point review of systems was ascertained today and negative unless otherwise noted in interval history.     MEDICATIONS     Current Outpatient Medications   Medication Instructions    acetaminophen (TYLENOL) 650 mg, Oral, Every 6 hours PRN    aspirin (ASPIRIN LOW DOSE) 81 mg, Oral, Daily    atorvastatin (LIPITOR) 10 mg, Oral, Every night at bedtime    benzoyl peroxide-erythromycin 5-3% gel Apply to acne once daily.    Calcium Carb-Cholecalciferol 500-10 MG-MCG CHEW TAKE 1 CHEWABLE BY MOUTH THREE TIMES DAILY    diclofenac Sodium (VOLTAREN) 2 g, Topical, 4 times daily PRN    DILANTIN 400 mg, Oral, Nightly    docusate 100 mg capsule No dose, route, or frequency recorded.    ibuprofen (ADVIL, MOTRIN) 600 mg, Oral, Daily PRN    ibuprofen (ADVIL, MOTRIN) 400 mg, Oral, Daily PRN, With food    naproxen 500 mg tablet 1 tablet, Oral, 2 times daily    oxyCODONE 5-10 mg, Oral, Every 4 hours PRN    sulfacetamide-sulfur 9-4.5 % LIQD liquid Apply to face twice daily    Tretinoin Microsphere (RETIN-A MICRO PUMP) 0.06 % GEL APPLY A PEA SIZED AMOUNT TOPICALLY TO AFFECTED AREA OF FACE NIGHTLY     ALLERGIES     Allergies   Allergen Reactions    Topiramate      Eye swelling    Sulfa Antibiotics Angioedema     No anaphylaxis    Amlodipine Other (See Comments)     Lip swelling without anaphylaxis    Carvedilol Angioedema     Lip swelling without anaphylaxis    Losartan Rash     No hive, no anaphylaxis, unclear if truly has rash from this medicine    Penicillins Rash     ''bumps on body''         MEDICAL HISTORY     Past Medical History:   Diagnosis Date    Acne     Arthritis     Cerebral palsy (HCC/RAF)     Eczema     Hypertension     ''White coat syndrome''    Impaired glucose tolerance     Low vitamin D level     obese     Seizure (HCC/RAF) 1973    chronically on Dilantin, no seizure in 25 years, followed by Neurologist (Dr. Wiliam Ke) at [facility]    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)     R foot s/p surgical removal    Shortening of arm, congenital     right arm     SURGICAL HISTORY     Past Surgical History:   Procedure Laterality Date    rectal abscess  2010  surgery done at [facility] the [facility]    Seroma Removal      s/p removal of seroma on right foot after right foot trauma      FAMILY HISTORY     Family History   Problem Relation Age of Onset    Heart disease Mother     Diabetes Mother     Kidney disease Mother     Stroke Brother     Colon cancer Brother 50    Prostate cancer Neg Hx     Ovarian cancer Neg Hx     Breast cancer Neg Hx     Hyperlipidemia Neg Hx     Hypertension Neg Hx     Anesthesia problems Neg Hx     Malignant hyperthermia Neg Hx     Hypotension Neg Hx     Malignant hypertension Neg Hx          SOCIAL HISTORY     Social History     Socioeconomic History    Marital status: Married    Number of children: 0   Occupational History    Occupation: Audiological scientist estate     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 No    Types of Exercise? (List in Comments) Yes     Comment: biking infrequently, abdominal exercise    Do you follow a special diet? No    Vegan? No    Vegetarian? No    Pescatarian? No    Lactose Free? No    Gluten Free? No    Omnivore? Yes         PHYSICAL EXAM     There were no vitals filed for this visit.      GENERAL: pleasant, cooperative, well-appearing, well-nourished, in no acute distress.    HEENT: Normocephalic, atraumatic. Pupils equal, round, reactive to light   without scleral icterus. Oropharynx is clear without lesions, erythema or   Exudate.   NECK: Supple.   CHEST: RRR  LUNGS: Effort is normal. Breath sounds clears. No wheezes rales or crackles   ABDOMEN: Soft, Non-tender, Non-distended, No organomegaly, No masses palpable.   MUSCULOSKELETAL: Normal range of motion, moving all extremities   symmetrically. Normal gait.  EXTREMITIES: Without clubbing, cyanosis, edema   NEUROLOGIC: alert and oriented x 3. Normal mood and affect. Behavior  is appropriate.        LABORATORY DATA       Lab Results   Component Value Date    WBC 4.86 07/26/2023    HGB 13.2 (L) 07/26/2023    HCT 38.9 07/26/2023    MCV 98.7 (H) 07/26/2023    PLT 184 07/26/2023       Lab Results   Component Value Date    CREAT 0.58 (L) 07/26/2023    BUN 10 07/26/2023    NA 139 07/26/2023    K 3.9 07/26/2023    CL 104 07/26/2023    CO2 24 07/26/2023  IMAGING     No imaging has been resulted in the last 30 days        PATHOLOGY     All available imaging and studies were reviewed.      ASSESSMENT AND PLAN     [name] is a 67 y.o. male with anemia    # Macrocytic anemia  > iron, folate, and vitB12 wnl  - labs as below      No orders of the defined types were placed in this encounter.        RTC ***    Alethia Berthold, MD  Hematology/Oncology  Clinical Instructor, [facility] [employer]  Pager: [phone]

## 2023-08-30 NOTE — Telephone Encounter
Call Back Request      Reason for call back: Per patient he had cancel his  appt today in [place] as he prefers to be seen in [place] . Please advise.       Thank you     Any Symptoms:  []  Yes  [x]  No      If yes, what symptoms are you experiencing:    Duration of symptoms (how long):    Have you taken medication for symptoms (OTC or Rx):      If call was taken outside of clinic hours:    [] Patient or caller has been notified that this message was sent outside of normal clinic hours.     [] Patient or caller has been warm transferred to the physician's answering service. If applicable, patient or caller informed to please call us back if symptoms progress.  Patient or caller has been notified of the turnaround time of 1-2 business day(s).

## 2023-09-23 ENCOUNTER — Ambulatory Visit: Payer: BLUE CROSS/BLUE SHIELD

## 2023-09-23 DIAGNOSIS — L280 Lichen simplex chronicus: Secondary | ICD-10-CM

## 2023-09-23 DIAGNOSIS — L700 Acne vulgaris: Secondary | ICD-10-CM

## 2023-09-23 MED ORDER — SULFACETAMIDE SODIUM-SULFUR 9-4.5 % EX LIQD
11 refills | Status: AC
Start: 2023-09-23 — End: ?

## 2023-09-23 MED ORDER — RETIN-A MICRO PUMP 0.06 % EX GEL
11 refills | Status: AC
Start: 2023-09-23 — End: ?

## 2023-09-23 MED ORDER — BENZOYL PEROXIDE-ERYTHROMYCIN 5-3 % EX GEL
11 refills | Status: AC
Start: 2023-09-23 — End: ?

## 2023-09-23 NOTE — Patient Instructions
-------------------------------------------------------  MyChart messages should be reserved for non-urgent and BRIEF questions relevant to issues covered during your visit. For urgent messages, please call [phone]). For issues not discussed during your visit, please make an appointment - you can request an urgent appointment or may have to see someone in another office. These messages are only received during business hours and delayed responses may be expected. For extensive/multiple questions taking more than 5 minutes of your physicians time, your insurance will be billed for this time and you may be responsible for a portion of it.    For prescription refills, please check with your pharmacy for available refills prior to messaging. Also, prior authorizations may be required for many prescriptions. Please follow up with the clinic and pharmacy and insurance company for help with this. For some of these medications, using goodrx (goodrx.com or download the app), may give you the best deal.

## 2023-09-23 NOTE — Progress Notes
DERMATOLOGY ATTENDING ESTABLISHED PATIENT NOTE    PATIENT: Dustin Watts   MRN: [id number]   DOB: April 25, 1956   DATE OF SERVICE: 09/23/2023     CHIEF COMPLAINT:   Chief Complaint   Patient presents with    Acne     Follow up         Visit start time: 9:45 AM     Dustin Watts is a 67 y.o. who presents for follow up acne and lichen simplex chronicus.    Last seen:   Last Visits with Me       Date Department Visit Type Primary Dx    09/17/2022 [facility] [place] Office Visit Acne vulgaris    07/11/2021 [facility] [place] Office Visit Acne vulgaris    10/10/2020 [facility] [place] Office Visit Acne vulgaris    10/19/2019 [facility] [place] Office Visit Acne vulgaris    01/12/2019 [facility] [place] Office Visit Acne vulgaris          Needs refills; retin-a micro pump 0.06% is what works for him.   Sulfacetamide-sulfur wash and benzamycin gel work for him also (no issue with sulfacetamide despite documented sulfa allergy)    Skin is okay.    PMH/PSH/etc.  (click to expand/collapse)   PMH:   Past Medical History:   Diagnosis Date    Acne     Arthritis     Cerebral palsy (HCC/RAF)     Eczema     Hypertension     ''White coat syndrome''    Impaired glucose tolerance     Low vitamin D level     obese     Seizure (HCC/RAF) 1973    chronically on Dilantin, no seizure in 25 years, followed by Neurologist (Dr. Wiliam Ke) at [facility]    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)     R foot s/p surgical removal    Shortening of arm, congenital     right arm     PSH:   Past Surgical History:   Procedure Laterality Date    rectal abscess  2010    surgery done at [facility] the [facility]    Seroma Removal      s/p removal of seroma on right foot after right foot trauma        Outpatient Medications Marked as Taking for the 09/23/23 encounter (Office Visit) with Arbutus Ped, MD   Medication    acetaminophen 325 mg tablet aspirin (ASPIRIN LOW DOSE) 81 mg EC tablet    atorvastatin 10 mg tablet    benzoyl peroxide-erythromycin 5-3% gel    Calcium Carb-Cholecalciferol 500-10 MG-MCG CHEW    diclofenac Sodium 1% gel    DILANTIN 100 MG ER capsule    docusate 100 mg capsule    ibuprofen 400 mg tablet    ibuprofen 600 mg tablet    naproxen 500 mg tablet    oxyCODONE 5 mg tablet    sulfacetamide-sulfur 9-4.5 % LIQD liquid    Tretinoin Microsphere (RETIN-A MICRO PUMP) 0.06 % GEL       Allergies   Allergen Reactions    Topiramate      Eye swelling    Sulfa Antibiotics Angioedema     No anaphylaxis    Amlodipine Other (See Comments)     Lip swelling without anaphylaxis    Carvedilol Angioedema     Lip swelling without anaphylaxis  Losartan Rash     No hive, no anaphylaxis, unclear if truly has rash from this medicine    Penicillins Rash     ''bumps on body''            Objective (click to expand/collapse)      BP 156/81  ~ Pulse 86  ~ Temp 36.6 ?C (97.8 ?F) (Tympanic)  ~ Resp 17  ~ Ht 6' (1.829 m)  ~ SpO2 98%  ~ BMI 30.79 kg/m?     General: well-appearing, in no acute distress    Skin exam:  Total body skin exam performed as listed below.  Head/face: examined  All areas examined were within normal limits with the following exceptions:     Left Lower Cutaneous Lip, Mid Forehead  Few thin pink papules    Mid Lower Cutaneous Lip  No lichenified plaques today             Assessment & Plan      1. Acne vulgaris  Mid Forehead; Left Lower Cutaneous Lip    Chronic condition, mostly stable.   Continue current management.     Printed prescriptions per patient preference.    Related Medications  sulfacetamide-sulfur 9-4.5 % LIQD liquid  Apply to face twice daily    benzoyl peroxide-erythromycin 5-3% gel  Apply to acne once daily.    Tretinoin Microsphere (RETIN-A MICRO PUMP) 0.06 % GEL  APPLY A PEA SIZED AMOUNT TOPICALLY TO AFFECTED AREA OF FACE NIGHTLY    2. Lichen simplex chronicus  Mid Lower Cutaneous Lip    Chronic condition, appears to be controlled        Assessment and plan were discussed with the patient.   Common risks, benefits, alternatives of medications and interventions was discussed and the patient expressed understanding.   Where appropriate, printed information was provided in the after visit summary.    Counseled patient regarding details of diagnosis and prognosis.    F/u 1 year or sooner prn          Author: Arbutus Ped, MD 09/23/2023 9:45 AM     Future Appointments         Provider Department Visit Type    10/11/2023 1:00 PM Pershing Proud, NP [facility] Neurology NEW    10/25/2023 9:00 AM Derinda Late., MD [address] [facility] Oncology NEW    11/15/2023 10:45 AM Clagg, Sena Hitch., MD [facility] RETURN    01/07/2024 8:20 AM Liana Gerold, MD, MS [facility] - Rheumatology RETURN    01/25/2024 12:45 AM Lowella Petties., MD [facility] General Surgery RETURN    07/31/2024 10:00 AM Clagg, Sena Hitch., MD [facility] Primary and Specialty Care WELL ADULT (PHYSICAL)

## 2023-10-11 ENCOUNTER — Ambulatory Visit: Payer: BLUE CROSS/BLUE SHIELD | Attending: Acute Care

## 2023-10-11 DIAGNOSIS — R569 Unspecified convulsions: Secondary | ICD-10-CM

## 2023-10-11 MED ORDER — DILANTIN 100 MG PO CAPS
400 mg | ORAL_CAPSULE | Freq: Every evening | ORAL | 8 refills | 9.00000 days | Status: AC
Start: 2023-10-11 — End: ?

## 2023-10-11 NOTE — Patient Instructions
 [facility] Neurology Outpatient Clinic at [facility]  [phone] Phone  [phone] Fax  Phone line hours: 7 am to 7 pm, Monday to Friday  Address  [address]  [id number]  [place], [place] [address]  Scheduling MRI: [phone]  Scheduling EEG: [phone]

## 2023-10-11 NOTE — Consults
EPILEPSY CLINIC CONSULT NOTE    PATIENT:  Dustin Watts  MRN:  [id number]  DOB:  02-22-1956  DATE OF SERVICE:  10/11/2023      CHIEF COMPLAINT/REASON FOR CONSULT: Epilepsy    Subjective:     History of Present Illness:  Dustin Watts is a 67 y.o. left-handed male with a history of seizures, and cerebral palsy. Patient is transitioning care for continued seizure care from [facility] as his neurologist switched hospitals.    Patient reports that he had his first seizure at age 19 and has been diagnosed with epilepsy since then. Patient has been seizure free for the last 40 years. He was initially on Phenobarbital for 3-4 years then he was switched to Dilantin by outside neurologist. He has been on Dilantin 400 mg at bedtime for over 30 years.    Seizures semiology was mostly nocturnal with BTC seizures, and he would break with red spots all over his body after seizures taking a week to clear.  No reported auras. No other seizure semiology per patient report.        Denies any abnormal episodes of loss of consciousness, gaps in memory, or confusion. Nor have there been any episodes during sleep of abnormal movements, injuries, incontinence, or oral trauma.      Ictal Behaviors:  (1) Nocturnal BTC seizures; frequency: last one 30-40 years ago  History of status epilepticus: no  Triggers: stress    Past Medical History:  Past Medical History:   Diagnosis Date    Acne     Arthritis     Cerebral palsy (HCC/RAF)     Eczema     Hypertension     ''White coat syndrome''    Impaired glucose tolerance     Low vitamin D level     obese     Seizure (HCC/RAF) 1973    chronically on Dilantin, no seizure in 25 years, followed by Neurologist (Dr. Wiliam Ke) at [facility]    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)     R foot s/p surgical removal    Shortening of arm, congenital     right arm     Past Surgical History:   Procedure Laterality Date    rectal abscess  2010    surgery done at [facility] the [facility]    Seroma Removal s/p removal of seroma on right foot after right foot trauma     Allergies   Allergen Reactions    Topiramate      Eye swelling    Sulfa Antibiotics Angioedema     No anaphylaxis    Amlodipine Other (See Comments)     Lip swelling without anaphylaxis    Carvedilol Angioedema     Lip swelling without anaphylaxis    Losartan Rash     No hive, no anaphylaxis, unclear if truly has rash from this medicine    Penicillins Rash     ''bumps on body''     Current Outpatient Medications   Medication Instructions    acetaminophen (TYLENOL) 650 mg, Oral, Every 6 hours PRN    aspirin (ASPIRIN LOW DOSE) 81 mg, Oral, Daily    atorvastatin (LIPITOR) 10 mg, Oral, Every night at bedtime    benzoyl peroxide-erythromycin 5-3% gel Apply to acne once daily.    Calcium Carb-Cholecalciferol 500-10 MG-MCG CHEW TAKE 1 CHEWABLE BY MOUTH THREE TIMES DAILY    diclofenac Sodium (VOLTAREN) 2 g, Topical, 4 times daily PRN    DILANTIN 400  mg, Oral, Nightly    docusate 100 mg capsule No dose, route, or frequency recorded.    ibuprofen (ADVIL, MOTRIN) 600 mg, Oral, Daily PRN    ibuprofen (ADVIL, MOTRIN) 400 mg, Oral, Daily PRN, With food    naproxen 500 mg tablet 1 tablet, Oral, 2 times daily    oxyCODONE 5-10 mg, Oral, Every 4 hours PRN    sulfacetamide-sulfur 9-4.5 % LIQD liquid Apply to face twice daily    Tretinoin Microsphere (RETIN-A MICRO PUMP) 0.06 % GEL APPLY A PEA SIZED AMOUNT TOPICALLY TO AFFECTED AREA OF FACE NIGHTLY       Epileptic Seizure Risk Factors:  Birth/Development: unremarkable  History of febrile seizures: possible febrile seizure but patient unsure   History of CNS infection: no  History of stroke: no  History of head trauma: no  History of tumor: no    Non-Epileptic Event Risk Factors:  Psychiatric comorbidities: none  History of physical, sexual, or emotional abuse: no    Current Anti-Seizure Medications: Dilantin 400 mg    Prior Anti-Seizure Medications: Phenobarbital (did not work)    Family History:  No family history of seizures or epilepsy.    Social History:  Tobacco: no  Alcohol: no  Recreational Drugs: no  Home: live with sister   Education: Energy manager in business   Occupation: not working   Driving: patient does not currently drive    Review of Systems:  Fourteen point review of systems negative except as described in HPI.    Objective:     Vitals: BP 123/69  ~ Pulse 99  ~ Temp 36.6 ?C (97.9 ?F) (Forehead)  ~ Ht 6' (1.829 m)  ~ Wt 210 lb (95.3 kg)  ~ BMI 28.48 kg/m?      General: No acute distress.  Head: Normocephalic, atraumatic. External ears and nose normal.  Eyes: Normal sclerae and conjunctivae bilaterally.  Pulmonary: No accessory muscle use or respiratory distress.  Psych: Normal mood with congruent affect.    Mental status: Alert and oriented. Speech clear and fluent without paraphasias. Answers questions appropriately. Fund of knowledge good.  Cranial nerves: Gaze conjugate, no nystagmus. Face symmetrical with facial movements preserved. Hearing grossly intact.  Motor: No adventitious movements.  Coordination: Grossly intact.  Gait/Station: Sitting in chair.    Labs:  2024 reviewed     External Studies (findings copied directly from reports):   EEG and MRI brain done years ago      Assessment:     Dustin Watts is a 67 y.o.  left-handed male with a history of seizures, cerebral palsy, and. Patient is transitioning care for continued seizure care from [facility] to [facility] for continued seizure care. Last seizure per patient report was 40 years ago. Has been seizure free on Dilantin 400 mg at bedtime          Plan/Recommendation:     Continue Dilantin 400mg  at bedtime the same- no side effects  Requested patient to bring external EEG and MRI studies (raw data, not just reports) for [place] to have in our records  Counseled regarding seizure precautions, SUDEP, and medication adherence as appropriate  Patient does not drive     Follow up 6 month or earlier if needed, video visit ok    Author:  Pershing Proud, NP

## 2023-10-24 NOTE — Progress Notes
HEMATOLOGY & ONCOLOGY CLINIC    Patient name:  Dustin Watts  MRN:  [id number]  DOB:  09-20-1956  Date of encounter: 10/24/2023      History of Present Illness  67 yo with hx of CP and seizures, following with neurology. He was found to have hgb 13.2 on routine labs with PCP and referred for consultation.     Hgb 13.2 on 07/26/23, MCV 98.7. Ferritin 92. Folate 35.7. Vita B12 2957. Cr 0.58.     CBC's in CC going back many yrs reveal fluctuating RBC/Hb, but stable over time. Normal plts and WBC/ANC.    ROS: he feels well. Good energy and appetite. Stable AED dose; no seizures in >30-40 yrs      Outpatient Medications Prior to Visit   Medication Sig    acetaminophen 325 mg tablet Take 2 tablets (650 mg total) by mouth every six (6) hours as needed (mild pain).    aspirin (ASPIRIN LOW DOSE) 81 mg EC tablet Take 1 tablet (81 mg total) by mouth daily.    atorvastatin 10 mg tablet Take 1 tablet (10 mg total) by mouth at bedtime. (Patient not taking: Reported on 10/11/2023)    benzoyl peroxide-erythromycin 5-3% gel Apply to acne once daily.    Calcium Carb-Cholecalciferol 500-10 MG-MCG CHEW Chew 1 tablet by mouth daily.    diclofenac Sodium 1% gel Apply 2 g topically four (4) times daily as needed.    DILANTIN 100 MG ER capsule Take 4 capsules (400 mg total) by mouth at bedtime.    docusate 100 mg capsule  (Patient not taking: Reported on 10/11/2023)    ibuprofen 400 mg tablet Take 1 tablet (400 mg total) by mouth daily as needed With food.    ibuprofen 600 mg tablet Take 1 tablet (600 mg total) by mouth daily as needed.    naproxen 500 mg tablet Take 1 tablet (500 mg total) by mouth two (2) times daily.    oxyCODONE 5 mg tablet Take 1-2 tablets (5-10 mg total) by mouth every four (4) hours as needed for Moderate Pain (Pain Scale 4-6) or Severe Pain (Pain Scale 7-10). Max Daily Amount: 60 mg    sulfacetamide-sulfur 9-4.5 % LIQD liquid Apply to face twice daily    Tretinoin Microsphere (RETIN-A MICRO PUMP) 0.06 % GEL APPLY A PEA SIZED AMOUNT TOPICALLY TO AFFECTED AREA OF FACE NIGHTLY     No facility-administered medications prior to visit.       Allergies   Allergen Reactions    Topiramate      Eye swelling    Sulfa Antibiotics Angioedema     No anaphylaxis    Amlodipine Other (See Comments)     Lip swelling without anaphylaxis    Carvedilol Angioedema     Lip swelling without anaphylaxis    Losartan Rash     No hive, no anaphylaxis, unclear if truly has rash from this medicine    Penicillins Rash     ''bumps on body''     Past Medical History:   Diagnosis Date    Acne     Arthritis     Cerebral palsy (HCC/RAF)     Eczema     Hypertension     ''White coat syndrome''    Impaired glucose tolerance     Low vitamin D level     obese     Seizure (HCC/RAF) 1973    chronically on Dilantin, no seizure in 25 years, followed by Neurologist (Dr. Shanda Bumps  Meir) at [facility]    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)     R foot s/p surgical removal    Shortening of arm, congenital     right arm     Past Surgical History:   Procedure Laterality Date    rectal abscess  2010    surgery done at [facility]    Seroma Removal      s/p removal of seroma on right foot after right foot trauma     Social History     Tobacco Use    Smoking status: Never     Passive exposure: Never    Smokeless tobacco: Never   Substance Use Topics    Alcohol use: No     Family History   Problem Relation Age of Onset    Heart disease Mother     Diabetes Mother     Kidney disease Mother     Stroke Brother     Colon cancer Brother 50    Prostate cancer Neg Hx     Ovarian cancer Neg Hx     Breast cancer Neg Hx     Hyperlipidemia Neg Hx     Hypertension Neg Hx     Anesthesia problems Neg Hx     Malignant hyperthermia Neg Hx     Hypotension Neg Hx     Malignant hypertension Neg Hx        Objective  Last Recorded Vital Signs:    10/25/23 0855   BP: 151/93   Pulse: 79   Resp: 18   Temp: 36.4 ?C (97.6 ?F)   SpO2: 97%   No distress  Anicteric  RRR  Ext WWP  Skin without rash; nl fingernails    Hemoglobin   Date Value Ref Range Status   07/26/2023 13.2 (L) 13.5 - 17.1 g/dL Final   16/08/9603 54.0 13.5 - 17.1 g/dL Final     Hemoglobin (LabDAQ)   Date Value Ref Range Status   10/25/2023 14.3 13.7 - 17.5 g/dL Final     Hematocrit   Date Value Ref Range Status   07/26/2023 38.9 38.5 - 52.0 % Final   03/20/2022 43.4 38.5 - 52.0 % Final     Hematocrit (LabDAQ)   Date Value Ref Range Status   10/25/2023 43.0 40.1 - 51.0 % Final     White Blood Cell Count   Date Value Ref Range Status   07/26/2023 4.86 4.16 - 9.95 x10E3/uL Final   03/20/2022 4.32 4.16 - 9.95 x10E3/uL Final     WBC (LabDAQ)   Date Value Ref Range Status   10/25/2023 5.54 3.98 - 10.04 10??/uL Final     Platelet Count, Auto   Date Value Ref Range Status   07/26/2023 184 143 - 398 x10E3/uL Final   03/20/2022 198 143 - 398 x10E3/uL Final     Platelets (LabDAQ)   Date Value Ref Range Status   10/25/2023 178 163 - 369 10??/uL Final     Mean Corpuscular Hemoglobin   Date Value Ref Range Status   07/26/2023 33.5 (H) 26.4 - 33.4 pg Final   03/20/2022 32.6 26.4 - 33.4 pg Final     MCH (LabDAQ)   Date Value Ref Range Status   10/25/2023 32.9 (H) 25.6 - 32.2 pg Final     Mean Corpuscular Volume   Date Value Ref Range Status   07/26/2023 98.7 (H) 79.3 - 98.6 fL Final   03/20/2022 97.5 79.3 - 98.6 fL Final  MCV (LabDAQ)   Date Value Ref Range Status   10/25/2023 98.9 (H) 79.0 - 94.8 fL Final      *Blood smear: uniform RBC's; scattered granular lymphs, otherwise nl WBC morphology; adequate plts.    Impression and Recommendations:  MILAM HURLESS is a 67 y.o. male with intermittent, modest anemia, with nl WBC/ANC and plts; this may be constitutional (a normal variant = his baseline). Long term AED use, and high-nl MCV. He is asymptomatic.    - observe, with CBC q6-12 months, or prn  - may return to see [place] anytime.      Derinda Late 10/25/2023 10:57 PM

## 2023-10-25 ENCOUNTER — Ambulatory Visit: Payer: BLUE CROSS/BLUE SHIELD

## 2023-10-25 DIAGNOSIS — D649 Anemia, unspecified: Secondary | ICD-10-CM

## 2023-10-25 LAB — Make Smear & Hold

## 2023-10-25 LAB — Reticulocyte Count: IMMATURE RETIC FRACTION: 11 % (ref 2.6–15.8)

## 2023-11-15 ENCOUNTER — Ambulatory Visit: Payer: BLUE CROSS/BLUE SHIELD

## 2023-11-15 DIAGNOSIS — Z6830 Body mass index (BMI) 30.0-30.9, adult: Secondary | ICD-10-CM

## 2023-11-15 DIAGNOSIS — E66811 Class 1 obesity due to excess calories with serious comorbidity and body mass index (BMI) of 30.0 to 30.9 in adult: Secondary | ICD-10-CM

## 2023-11-15 DIAGNOSIS — M65872 Other synovitis and tenosynovitis, left ankle and foot: Secondary | ICD-10-CM

## 2023-11-15 DIAGNOSIS — E6609 Other obesity due to excess calories: Secondary | ICD-10-CM

## 2023-11-15 DIAGNOSIS — Z23 Encounter for immunization: Secondary | ICD-10-CM

## 2023-11-15 MED ORDER — DICLOFENAC SODIUM 1 % EX GEL
2 g | Freq: Four times a day (QID) | TOPICAL | 2 refills | Status: AC | PRN
Start: 2023-11-15 — End: ?

## 2023-11-15 MED ORDER — SEMAGLUTIDE-WEIGHT MANAGEMENT 0.25 MG/0.5ML SC SOAJ
.25 mg | SUBCUTANEOUS | 0 refills | Status: AC
Start: 2023-11-15 — End: 2023-11-23

## 2023-11-15 MED ORDER — CALCIUM CARB-CHOLECALCIFEROL 500-10 MG-MCG PO CHEW
1 | ORAL_TABLET | Freq: Every day | ORAL | 1 refills | Status: AC
Start: 2023-11-15 — End: ?

## 2023-11-15 MED ORDER — DICLOFENAC SODIUM 1 % EX GEL
2 g | Freq: Four times a day (QID) | TOPICAL | 1 refills | PRN
Start: 2023-11-15 — End: ?

## 2023-11-15 NOTE — Progress Notes
[facility]  Internal Medicine-Pediatrics    PATIENT: Dustin Watts  MRN: [id number]  DOB: [birth year]  DATE OF SERVICE: 11/15/2023    CHIEF COMPLAINT:   Chief Complaint   Patient presents with    Foot  brace        HPI & Other Histories   Dustin Watts is a 68 y.o. male with hx below who presents for foot and weight    Foot Concern  - seen 3 months ago for left foot pain  - x-ray shows no fracture, but he's interested in wearing a brace  - saw a provide in [place] in the past where he was able to get a brace - doesn't remember which clinic  - pain has improved with rest    Weight Management  - BMI >30  - hx of HTN and prediabetes  - on statin for cholesterol control  - no personal hx of thyroid cancer  - no fam hx of MEN syndromes  - no hx of pancreatitis    Past Medical History:   Diagnosis Date    Acne     Arthritis     Cerebral palsy (HCC/RAF)     Eczema     Hypertension     ''White coat syndrome''    Impaired glucose tolerance     Low vitamin D level     obese     Seizure (HCC/RAF) 1973    chronically on Dilantin, no seizure in 25 years, followed by Neurologist (Dr. Wiliam Ke) at [facility]    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)     R foot s/p surgical removal    Shortening of arm, congenital     right arm     Past Surgical History:   Procedure Laterality Date    rectal abscess  2010    surgery done at [facility]    Seroma Removal      s/p removal of seroma on right foot after right foot trauma     Family History   Problem Relation Age of Onset    Heart disease Mother     Diabetes Mother     Kidney disease Mother     Stroke Brother     Colon cancer Brother 50    Prostate cancer Neg Hx     Ovarian cancer Neg Hx     Breast cancer Neg Hx     Hyperlipidemia Neg Hx     Hypertension Neg Hx     Anesthesia problems Neg Hx     Malignant hyperthermia Neg Hx     Hypotension Neg Hx     Malignant hypertension Neg Hx      Social History     Tobacco Use    Smoking status: Never     Passive exposure: Never    Smokeless tobacco: Never   Vaping Use    Vaping status: Never Used   Substance Use Topics    Alcohol use: No    Drug use: No     Allergies   Allergen Reactions    Topiramate      Eye swelling    Sulfa Antibiotics Angioedema     No anaphylaxis    Amlodipine Other (See Comments)     Lip swelling without anaphylaxis    Carvedilol Angioedema     Lip swelling without anaphylaxis    Losartan Rash     No hive, no anaphylaxis, unclear if truly has  rash from this medicine    Penicillins Rash     ''bumps on body''       MEDS     Outpatient Medications Prior to Visit   Medication Sig    acetaminophen 325 mg tablet Take 2 tablets (650 mg total) by mouth every six (6) hours as needed (mild pain).    aspirin (ASPIRIN LOW DOSE) 81 mg EC tablet Take 1 tablet (81 mg total) by mouth daily.    atorvastatin 10 mg tablet Take 1 tablet (10 mg total) by mouth at bedtime.    benzoyl peroxide-erythromycin 5-3% gel Apply to acne once daily.    Calcium Carb-Cholecalciferol 500-10 MG-MCG CHEW Chew 1 tablet by mouth daily.    diclofenac Sodium 1% gel Apply 2 g topically four (4) times daily as needed.    DILANTIN 100 MG ER capsule Take 4 capsules (400 mg total) by mouth at bedtime.    docusate 100 mg capsule     ibuprofen 400 mg tablet Take 1 tablet (400 mg total) by mouth daily as needed With food.    ibuprofen 600 mg tablet Take 1 tablet (600 mg total) by mouth daily as needed.    naproxen 500 mg tablet Take 1 tablet (500 mg total) by mouth two (2) times daily.    oxyCODONE 5 mg tablet Take 1-2 tablets (5-10 mg total) by mouth every four (4) hours as needed for Moderate Pain (Pain Scale 4-6) or Severe Pain (Pain Scale 7-10). Max Daily Amount: 60 mg    sulfacetamide-sulfur 9-4.5 % LIQD liquid Apply to face twice daily    Tretinoin Microsphere (RETIN-A MICRO PUMP) 0.06 % GEL APPLY A PEA SIZED AMOUNT TOPICALLY TO AFFECTED AREA OF FACE NIGHTLY     No facility-administered medications prior to visit.       PHYSICAL EXAM      Last Recorded Vital Signs:    11/15/23 1038   BP: 157/91   Pulse:    Resp:    Temp:    SpO2:      Body mass index is 30.64 kg/m?Marland Kitchen    General - Awake and alert, NAD  Pulm - No signs of increased WOB  Eyes - Clear conjunctiva    A&P   Dustin Watts is a a 68 y.o. male presenting for   Chief Complaint   Patient presents with    Foot  brace       1. Class 1 obesity due to excess calories with serious comorbidity and body mass index (BMI) of 30.0 to 30.9 in adult  - no red flags to preclude use of semaglutide  - sent:  - semaglutide (WEGOVY) 0.25 mg/0.5 mL SC injection pen; Inject 0.5 mLs (0.25 mg total) under the skin once a week.  Dispense: 2 mL; Refill: 0  - counseled on GI side effects  - Referral to Program for Reducing Obesity (PRO)  - rtc in 3-4 months to monitor weight if med approved    2. Other synovitis and tenosynovitis, left ankle and foot  - refilled:  - diclofenac Sodium 1% gel; Apply 2 g topically four (4) times daily as needed.  Dispense: 100 g; Refill: 2    3. Need for COVID-19 vaccine  - COVID-19 vaccine IM [employer]) age 110 years and older    4. Flu vaccine need  - Influenza vaccine IM; high dose; PF (Fluzone HD) (age 11 years and older)    The above recommendations were discussed with the patient. The patient  had all questions answered satisfactorily and is in agreement with this recommended plan of care.    Author:    Sena Hitch Zoua Caporaso   11/15/2023   10:52 AM

## 2023-11-19 ENCOUNTER — Telehealth: Payer: BLUE CROSS/BLUE SHIELD

## 2023-11-20 MED ORDER — SEMAGLUTIDE-WEIGHT MANAGEMENT 0.25 MG/0.5ML SC SOAJ
.25 mg | SUBCUTANEOUS | 0 refills
Start: 2023-11-20 — End: ?

## 2023-11-20 NOTE — Telephone Encounter
Medication Prior Authorization    Name of medication: Semaglutide [facility])     Prescribed date: 11/15/2023    Is there an alternative covered medication?     Has patient taken any other medications for this matter? no    [employer] number: [phone] [facility] company fax number:     Patient or caller has been notified of the turnaround time of 1-2 business day(s).

## 2023-11-22 MED ORDER — SEMAGLUTIDE-WEIGHT MANAGEMENT 0.25 MG/0.5ML SC SOAJ
.25 mg | SUBCUTANEOUS | 0 refills | Status: AC
Start: 2023-11-22 — End: ?

## 2023-11-24 ENCOUNTER — Ambulatory Visit: Payer: BLUE CROSS/BLUE SHIELD

## 2023-11-25 ENCOUNTER — Telehealth: Payer: BLUE CROSS/BLUE SHIELD

## 2023-11-25 NOTE — Telephone Encounter
Message to Practice/Provider      Message: Hi team,  Per patient his insurance company is requesting additional information from Dr Production manager for the [employer].    Dr sent in forms previously , now the insurance is asking why is he on the medication     Please ask staff to check fax to retrieve the documents for this request.    Please leave patient a my chart message as well a voice mail stating this has been take care of.  Thank you.    Patient or caller has been notified that their message will be reviewed within 1-2 business days.yes

## 2023-11-26 ENCOUNTER — Telehealth: Payer: BLUE CROSS/BLUE SHIELD

## 2023-11-26 NOTE — Telephone Encounter
Call Back Request      Reason for call back: Pt. Is calling to follow up with the prior auth for medication WEGOVY. Pt. Says he has been waiting and the medication is stuck in the process.Pt. says the insurance says the medication is on hold. Please advise, thank you.     Any Symptoms:  []  Yes  [x]  No      If yes, what symptoms are you experiencing:    Duration of symptoms (how long):    Have you taken medication for symptoms (OTC or Rx):      If call was taken outside of clinic hours:    [] Patient or caller has been notified that this message was sent outside of normal clinic hours.     [] Patient or caller has been warm transferred to the physician's answering service. If applicable, patient or caller informed to please call us back if symptoms progress.  Patient or caller has been notified of the turnaround time of 1-2 business day(s).

## 2023-12-02 ENCOUNTER — Telehealth: Payer: BLUE CROSS/BLUE SHIELD

## 2023-12-02 NOTE — Telephone Encounter
PA for [facility] submitted via fax.  Forms received and faxed back to [facility].  Waiting for outcome at this time.

## 2023-12-02 NOTE — Telephone Encounter
PA forms faxed to [facility].  Waiting for response.

## 2023-12-02 NOTE — Telephone Encounter
Message to Practice/Provider      Message: The phone number to contact to provide more information for this wegovy is [phone].    Customer ID # on his prescription: [id number]  File # [id number]    Patient or caller has been notified that their message will be reviewed within 1-2 business days.

## 2023-12-02 NOTE — Telephone Encounter
PA forms faxed to [facility].

## 2023-12-02 NOTE — Telephone Encounter
Call Back Request      Reason for call back: Pt stating Pharmacy states prior Auth has not been submitted for Rx Wegovy. Pt is providing contact info for Verbal auth.     PH# [phone]  [id number] code#  Customer ID#       Any Symptoms:  []  Yes  [x]  No      If yes, what symptoms are you experiencing:    Duration of symptoms (how long):    Have you taken medication for symptoms (OTC or Rx):      If call was taken outside of clinic hours:    [] Patient or caller has been notified that this message was sent outside of normal clinic hours.     [] Patient or caller has been warm transferred to the physician's answering service. If applicable, patient or caller informed to please call us back if symptoms progress.  Patient or caller has been notified of the turnaround time of 1-2 business day(s).

## 2024-01-03 ENCOUNTER — Ambulatory Visit: Payer: MEDICAID

## 2024-01-03 DIAGNOSIS — Z0000 Encounter for general adult medical examination without abnormal findings: Secondary | ICD-10-CM

## 2024-01-03 DIAGNOSIS — D649 Anemia, unspecified: Secondary | ICD-10-CM

## 2024-01-03 NOTE — Progress Notes
 HEMATOLOGY & ONCOLOGY CLINIC    Patient name:  Dustin Watts  MRN:  [id number]  DOB:  [birth year]  Date of encounter: 01/03/2024      History of Present Illness  68 yo with hx of CP and seizures, following with neurology. He was found to have hgb 13.2 on routine labs with PCP and referred for consultation.     Hgb 13.2 on 07/26/23, MCV 98.7. Ferritin 92. Folate 35.7. Vita B12 2957. Cr 0.58.     CBC's in CC going back many yrs reveal fluctuating RBC/Hb, but stable over time. Normal plts and WBC/ANC.    ROS: he feels well. Good energy and appetite. Stable AED dose; no seizures in >30-40 yrs.    *Interim: remains well.      Outpatient Medications Prior to Visit   Medication Sig    acetaminophen 325 mg tablet Take 2 tablets (650 mg total) by mouth every six (6) hours as needed (mild pain).    aspirin (ASPIRIN LOW DOSE) 81 mg EC tablet Take 1 tablet (81 mg total) by mouth daily.    atorvastatin 10 mg tablet Take 1 tablet (10 mg total) by mouth at bedtime.    benzoyl peroxide-erythromycin 5-3% gel Apply to acne once daily.    Calcium Carb-Cholecalciferol 500-10 MG-MCG CHEW Chew 1 tablet by mouth daily.    diclofenac Sodium 1% gel Apply 2 g topically four (4) times daily as needed.    DILANTIN 100 MG ER capsule Take 4 capsules (400 mg total) by mouth at bedtime.    docusate 100 mg capsule     ibuprofen 400 mg tablet Take 1 tablet (400 mg total) by mouth daily as needed With food.    ibuprofen 600 mg tablet Take 1 tablet (600 mg total) by mouth daily as needed.    naproxen 500 mg tablet Take 1 tablet (500 mg total) by mouth two (2) times daily.    oxyCODONE 5 mg tablet Take 1-2 tablets (5-10 mg total) by mouth every four (4) hours as needed for Moderate Pain (Pain Scale 4-6) or Severe Pain (Pain Scale 7-10). Max Daily Amount: 60 mg    semaglutide (WEGOVY) 0.25 mg/0.5 mL SC injection pen Inject 0.5 mLs (0.25 mg total) under the skin once a week.    sulfacetamide-sulfur 9-4.5 % LIQD liquid Apply to face twice daily    Tretinoin Microsphere (RETIN-A MICRO PUMP) 0.06 % GEL APPLY A PEA SIZED AMOUNT TOPICALLY TO AFFECTED AREA OF FACE NIGHTLY     No facility-administered medications prior to visit.       Allergies   Allergen Reactions    Topiramate      Eye swelling    Sulfa Antibiotics Angioedema     No anaphylaxis    Amlodipine Other (See Comments)     Lip swelling without anaphylaxis    Carvedilol Angioedema     Lip swelling without anaphylaxis    Losartan Rash     No hive, no anaphylaxis, unclear if truly has rash from this medicine    Penicillins Rash     ''bumps on body''     Past Medical History:   Diagnosis Date    Acne     Arthritis     Cerebral palsy (HCC/RAF)     Eczema     Hypertension     ''White coat syndrome''    Impaired glucose tolerance     Low vitamin D level     obese     Seizure (HCC/RAF)  1973    chronically on Dilantin, no seizure in 25 years, followed by Neurologist (Dr. Wiliam Ke) at [facility]    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)     R foot s/p surgical removal    Shortening of arm, congenital     right arm     Past Surgical History:   Procedure Laterality Date    rectal abscess  2010    surgery done at [facility]    Seroma Removal      s/p removal of seroma on right foot after right foot trauma     Social History     Tobacco Use    Smoking status: Never     Passive exposure: Never    Smokeless tobacco: Never   Substance Use Topics    Alcohol use: No     Family History   Problem Relation Age of Onset    Heart disease Mother     Diabetes Mother     Kidney disease Mother     Stroke Brother     Colon cancer Brother 50    Prostate cancer Neg Hx     Ovarian cancer Neg Hx     Breast cancer Neg Hx     Hyperlipidemia Neg Hx     Hypertension Neg Hx     Anesthesia problems Neg Hx     Malignant hyperthermia Neg Hx     Hypotension Neg Hx     Malignant hypertension Neg Hx        Objective  Last Recorded Vital Signs:    01/03/24 0911   BP: 161/95   Pulse: (!) 100   Resp: 16   Temp: 36.4 ?C (97.5 ?F)   SpO2: 95%   No distress  Anicteric  Ext WWP  Skin without rash    Hemoglobin   Date Value Ref Range Status   07/26/2023 13.2 (L) 13.5 - 17.1 g/dL Final   16/08/9603 54.0 13.5 - 17.1 g/dL Final     Hemoglobin (LabDAQ)   Date Value Ref Range Status   10/25/2023 14.3 13.7 - 17.5 g/dL Final     Hematocrit   Date Value Ref Range Status   07/26/2023 38.9 38.5 - 52.0 % Final   03/20/2022 43.4 38.5 - 52.0 % Final     Hematocrit (LabDAQ)   Date Value Ref Range Status   10/25/2023 43.0 40.1 - 51.0 % Final     White Blood Cell Count   Date Value Ref Range Status   07/26/2023 4.86 4.16 - 9.95 x10E3/uL Final   03/20/2022 4.32 4.16 - 9.95 x10E3/uL Final     WBC (LabDAQ)   Date Value Ref Range Status   10/25/2023 5.54 3.98 - 10.04 10??/uL Final     Platelet Count, Auto   Date Value Ref Range Status   07/26/2023 184 143 - 398 x10E3/uL Final   03/20/2022 198 143 - 398 x10E3/uL Final     Platelets (LabDAQ)   Date Value Ref Range Status   10/25/2023 178 163 - 369 10??/uL Final     Mean Corpuscular Hemoglobin   Date Value Ref Range Status   07/26/2023 33.5 (H) 26.4 - 33.4 pg Final   03/20/2022 32.6 26.4 - 33.4 pg Final     MCH (LabDAQ)   Date Value Ref Range Status   10/25/2023 32.9 (H) 25.6 - 32.2 pg Final     Mean Corpuscular Volume   Date Value Ref Range Status   07/26/2023 98.7 (H)  79.3 - 98.6 fL Final   03/20/2022 97.5 79.3 - 98.6 fL Final     MCV (LabDAQ)   Date Value Ref Range Status   10/25/2023 98.9 (H) 79.0 - 94.8 fL Final      *Blood smear: uniform RBC's; scattered granular lymphs, otherwise nl WBC morphology; adequate plts.    Impression and Recommendations:  Dustin Watts is a 68 y.o. male with intermittent, modest anemia, with nl WBC/ANC and plts; this may be constitutional (a normal variant = his baseline). Long term AED use, and high-nl MCV. He is asymptomatic.    - cont to observe, with CBC q6-12 months, or prn  - GI referral for screening colonoscopy (last >10 yrs ago).      Derinda Late 01/03/2024 6:20 PM

## 2024-01-07 ENCOUNTER — Ambulatory Visit: Payer: MEDICAID

## 2024-01-12 ENCOUNTER — Telehealth: Payer: MEDICAID

## 2024-01-25 ENCOUNTER — Ambulatory Visit: Payer: MEDICAID

## 2024-01-27 MED ORDER — ASPIRIN LOW DOSE 81 MG PO TBEC
81 mg | ORAL_TABLET | Freq: Every day | ORAL | 3 refills | Status: AC
Start: 2024-01-27 — End: ?

## 2024-02-03 NOTE — Progress Notes
 RHEUMATOLOGY OUTPATIENT CONSULTATION  PATIENT: Dustin [place] MRN: [id number] DOB: 1956/06/27  REASONS FOR VISIT/CHIEF COMPLAINT:OA      HPI:67 y.o. male former pt of Dr Jenette Mitchell with generalized OA, OK knees here to establish care.  Hx of epilepsy, pre-diabetic, HTN, HLD, vit D deficiency    Current visit patient reports the followings:-     02/07/2024 visit - persistent pain in left knee, increased with frequent walking past few days. Pain improved in L knee CSI last visit. Patient requests joints injection today 02/07/2024 - L knee steroid injection     08/09/2023 visit - Persistent L knee pain, pain improved after last injection, had slight bruising following. Patient requests joints injection today 08/09/2023  L knee CSI    04/19/2023 visit - pain in L knee, Patient requests joints injection today 04/19/2023  - L knee CSI    12/30/2022 visit - overall doing well - experiencing pain in L knee at night. Patient requests joints injection and aspiration today 12/30/2022  XR last visit Bilateral knees OA left knee effusion     11/28/2022 visit - Patient has not been seen since 10/2019  Marvell Slider twice during the pandemic, the most recent 2023 / Pain in both knees, worse in L / Other joints (hands and wrists are ok)- it like it to be drained left knee  03/2022 - Foot cyst removal   Hx of seizure diagnosis, but free from seizures for long time       10/25/2019  - doing well ibuprofen may be 3 times a week left knee bothers walking not bad ''ibuprofen before doing a lot of walking'' no stomach issue aware not to take daily  - lost about 3 lbs doing squat daily 200s and bike      07/2019  - staying home due to covid  - L knee is not bad; occasional small pain with walking ''do I have fluid in my knee''  - Ibuprofen 800 mg; tried not to take too many; 4-5 times a month or so could be more e.g before going out/walking    03/2019  - L knee doing OK; last injection pain after the procedure lasted 1.5 month; now fine; walking fine  - L ankle strain earlier this year not recall splint  - Ibuprofen 800 mg prn BID but not need them; taking oxycodone as needed on average once at night for the chronic LBP    09/2018  - overall good no falls; L knee some swelling and worsening pain wondering if take fluid out or if any fluid inside  - ibuprofen as needed 800 mg 2 times a day only prn for years no problem with food no indigestion  - otherwise doing well      11/2017   -last visit increasing knee pain XR R knee good L knee mild OA   - doing well; no seizure any longer  - joints are not bad; no pain with walking; no need to take ibuprofen regularly; takes infrequently- a day before and on day out [place]; requesting refill; no problem no GERD/taking with food    08/2017  -last visit 02/2017 joints are doing fine on prn NSAID/post CVA  -knees are doing not too badly ''only after long walk'' ''described as blown up'', keen to have refill ibuprofen 800 mg PRN given by Dr Jenette Mitchell in past ''I only take it if I have a long walk to do so taking infrequently''; no stiffness; at times puffy  not today; slight worse lately with pain   -other joints are fine  -chronic R sided atrophy since borne Dx cerebral palsy otherwise in good health  -no seizure for some time now      No known history of malar rash, photosensitivity rash, discoid lesions, oral/nasal ulcers,  pleuritic chest pain, seizures/psychosis, Raynaud's, skin tightness, dry eyes/mouth, hair loss or blood clots.  No history of muscle pain or proximal myopathy.  No history for fever, weight loss, loss of appetite, night sweat or lymphadenopathy.  No history of skin psoriasis, eye issues, STDs or IBD.    No recent travelling.  No sick contacts.    Prior medication/Joint injections/Immune suppressant therapy:  None    PMHx:  Past Medical History:   Diagnosis Date    Acne     Arthritis     Cerebral palsy (HCC/RAF)     Eczema     Hypertension     ''White coat syndrome''    Impaired glucose tolerance     Low vitamin D level     obese     Seizure (HCC/RAF) 1973    chronically on Dilantin, no seizure in 25 years, followed by Neurologist (Dr. Oley Berth) at [facility]    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)     R foot s/p surgical removal    Shortening of arm, congenital     right arm   pre-diabetic, HTN, HLD  Hx vit D deficiency WNL 02/2017  Overweight BMI 04/19/2023- 31    PSHx:  Past Surgical History:   Procedure Laterality Date    rectal abscess  2010    surgery done at [facility] the [facility]    Seroma Removal      s/p removal of seroma on right foot after right foot trauma       Meds:    Outpatient Medications Prior to Visit   Medication Sig    acetaminophen 325 mg tablet Take 2 tablets (650 mg total) by mouth every six (6) hours as needed (mild pain).    aspirin (ASPIRIN LOW DOSE) 81 mg EC tablet TAKE 1 TABLET BY MOUTH EVERY DAY    benzoyl peroxide-erythromycin 5-3% gel Apply to acne once daily.    Calcium Carb-Cholecalciferol 500-10 MG-MCG CHEW Chew 1 tablet by mouth daily.    diclofenac Sodium 1% gel Apply 2 g topically four (4) times daily as needed.    DILANTIN 100 MG ER capsule Take 4 capsules (400 mg total) by mouth at bedtime.    docusate 100 mg capsule Take 1 capsule (100 mg total) by mouth as needed for.    ibuprofen 400 mg tablet Take 1 tablet (400 mg total) by mouth daily as needed With food.    ibuprofen 600 mg tablet Take 1 tablet (600 mg total) by mouth daily as needed.    sulfacetamide-sulfur 9-4.5 % LIQD liquid Apply to face twice daily    Tretinoin Microsphere (RETIN-A MICRO PUMP) 0.06 % GEL APPLY A PEA SIZED AMOUNT TOPICALLY TO AFFECTED AREA OF FACE NIGHTLY    atorvastatin 10 mg tablet Take 1 tablet (10 mg total) by mouth at bedtime. (Patient not taking: Reported on 02/07/2024)    naproxen 500 mg tablet Take 1 tablet (500 mg total) by mouth two (2) times daily. (Patient not taking: Reported on 02/07/2024)    oxyCODONE 5 mg tablet Take 1-2 tablets (5-10 mg total) by mouth every four (4) hours as needed for Moderate Pain (Pain Scale 4-6) or Severe Pain (  Pain Scale 7-10). Max Daily Amount: 60 mg (Patient not taking: Reported on 02/07/2024)    semaglutide (WEGOVY) 0.25 mg/0.5 mL SC injection pen Inject 0.5 mLs (0.25 mg total) under the skin once a week. (Patient not taking: Reported on 02/07/2024)     No facility-administered medications prior to visit.         SocHx:  No T/E/D use  Works in Loss adjuster, chartered; single    FamHx:  There is no family history of autoimmune/rheumatic disease and psoriasis.    Physical Exam:    02/07/2024  BP 148/91 Comment: BP elevated. Patient denies sob, headache, chest pain. Notified MD. ~ Pulse 79  ~ Temp 36.4 ?C (97.6 ?F) (Forehead)  ~ Resp 16  ~ Ht 6' (1.829 m)  ~ Wt (!) 227 lb 11.2 oz (103.3 kg)  ~ SpO2 97%  ~ BMI 30.88 kg/m?     Pain Information (Last Filed)       Score Location Comments Edu?      8 Knee - left None No              NAD  No ocular injection  No alopecia  OP clear  No LAD  CTAB  RRR  S/NT/ND  No C/C/E  No rashes  No telangectasia/discoloration/calcinosis/sclerodactyly  No tender joints  No swollen joints  L knee crepitus+, small effusion, no warmth   No enthesitis  No dactylitis  No myofascial pain  Neuro grossly intact except R extremities atrophy  Peripheral pulses intact      Labs:  Lab Results   Component Value Date    WBC 5.54 10/25/2023    HGB 14.3 10/25/2023    HCT 43.0 10/25/2023    PLT 178 10/25/2023    NEUTABS 2.97 10/25/2023    LYMPHABS 2.04 10/25/2023     Lab Results   Component Value Date    GLUCOSE 109 (H) 07/26/2023    GFREST >89 04/18/2014    CREAT 0.58 (L) 07/26/2023    CALCIUM 9.1 07/26/2023    TOTPRO 7.4 07/26/2023    ALBUMIN 4.5 07/26/2023    AST 24 07/26/2023    ALT 29 07/26/2023    ALKPHOS 70 07/26/2023     @RRCOAGS @  Lab Results   Component Value Date    TSH 1.5 01/21/2017       Rheum labs:  Lab Results   Component Value Date    SRWEST 13 (H) 05/27/2017    SRWEST 15 (H) 11/28/2014     No results found for: ''CARDLIPIGA'', ''CARDLIPIGG'', ''CARDLIPIGM'', ''ANAAB'', ''ANAABTITER'', ''B2GLYPROIGA'', ''B2GLYPROIGG'', ''B2GLYPROIGM'', ''DRVVT'', ''RNPAB'', ''SMAB'', ''SSA''  No results found for: ''C3'', ''C4'', ''DSDNAAB'', ''NDNAABIFA''  No results found for: ''CANCA'', ''PANCA'', ''PROT3AB'', ''MPOAB''  Lab Results   Component Value Date    CKTOT 68 04/23/2008    URICACID 3.6 11/28/2014     Lab Results   Component Value Date    VITD25OH 35 10/09/2022    VITD25OH 37 09/30/2021     UA:   Lab Results   Component Value Date    PROTCLUR Trace 04/23/2008    BLDUR Negative 04/23/2008    LEUKESTUR Negative 04/23/2008    RBCSUR 5 04/23/2008    WBCSUR 3 04/23/2008       Studies: [x] reviewed [] ordered [] independently reviewed.  Component      Latest Ref Rng & Units 09/16/2018   Segmented Neutrophil      No Reference Range % 1   Lymphocyte      No Reference  Range % 1   Monocyte      No Reference Range % 98   Total Cells      cells 100   Fluid Appearance       Viscoid . . .   RBC Count,Joint Fl      No Reference Range /cmm <1,000   WBC Count,Joint Fl      No Reference Range /cmm 282   Crystals,Fluid #1      No Crystals Seen No Crystals Seen       Imaging:    XR Knees B/L 11/2022:   R- IMPRESSION: The previously noted material was extrinsic about the right knee. There is mild right and moderate left knee osteoarthritis.  L - IMPRESSION:  1. Limited examination due to overlying material. Rectangular density projecting over the suprapatellar recess on the lateral view of the right knee may be external. The patient will be called back to the department for repeat frontal and lateral views   of the right knee.  2. There is progression of left knee osteoarthritis, now moderate.     Knees XR 08/2017   Mild osteoarthritis of the left knee.  No significant arthritis of the right knee.    Knees 2007 normal     ASSESSMENT AND PLAN  TRASHAUN STREIGHT is a 68 y.o. male     # Knees OA stable on prn ibuprofen 800 mg as needed; no GERD; pt takes rarely; recent slight worsening with effusion in L knee s/p CSI 2019- synovial fluid no crystals    12/30/2022 - pain in L knee at night and feels there is fluid - aspiration and CSI of L knee today XR Knees 11/2022 shows mild osteoarthritis of R knee and moderate osteoarthritis of L knee  5 cc fluid obtained no crystals non-inflammatory    02/07/2024 L knee CSI    # Injection history left knee  L knee CSI  04/19/2023; 08/09/2023; 02/07/2024   L knee aspiration and CSI 12/2022   Bilateral CSI 09/2018    # Hx of epilepsy stable     RECOMMENDATIONS    L knee CSI today 08/09/2023     Weight reduction  Keep minimum Ibuprofen 400 mg qday PRN-with food    Follow up in 3 months 07/2023 - potential L knee injection - 40 minutes     Orders Placed This Encounter    Inject/Drain Knee    lidocaine PF 1% inj 2 mL    triamcinolone acetonide 40 mg/mL inj 40 mg        RTC 12 weeks or sooner as needed.  CC note to PCP/referred physician    Procedure Note:  L knee steroid injection  Physician performing injection: Rhys Chaco, MD, Rheumatology      The risks, benefits, and alternatives of the procedure were explained, and verbal consent was obtained.  The risks and benefits of the injection were discussed with the patient including but not limited to the risk of allergic reactions, infection, flare reaction, damage to local nerves, tendons or vessels, skin color changes (usually hyperpigmentation), local fat atrophy, the risk of blood sugar elevations and a hyperactivity response. The patient understands these risks elected to proceed.    Area over medial aspect of the L knees was prepped with betadyne and numbed with 1% xylocaine. 22-gauge needle was inserted, and Kenalog 40mg  + 4cc 1% xylocaine were injected into the left knee. Patient tolerated procedure well without any immediate complications. Patient was instructed to monitor  for s/s of reaction to steroid and infection.  ED precaution discussed.  Ice applied immediately at site of injection.      Problem is:  [x]  Chronic unstable  []  Chronic stable   []  New minor/self-limited problem  []  Acute uncomplicated illness/injury   []  Acute systemic illness or complicated injury   []  New problem with uncertain diagnosis  []  New problem that poses threat to life or bodily function    RISK OF COMPLICATION   This writer has deemed the above diagnoses to have a risk of complication, morbidity or mortality of:   []  Minimal  [x]  Low  []  Moderate  []  Severe     BILLING BY TIME   I spent  minutes for the items checked below on the day of service 02/07/2024;>50% spent in face to face evaluation and counseling the patient.  The above plan of care, diagnosis, orders, and follow-up were discussed with the patient. Questions related to this recommended plan of care were answered    [x]  Preparing to see the patient (e.g., review of tests) Reviewed and summarized old records.   [x]  Performing a medically appropriate examination and/or evaluation   [x]  Independently interpreting results and communicating results to patient/ family/caregiver  [x]  Counseling and educating the patient/family/caregiver   [x]  Ordering medications, tests, or procedures  [x]  Risks/benefits/toxicities of meds discussed.   [x]  Referring and communicating with other healthcare professionals (when not separately reported) -Discussed with other providers of complex medical conditions and management plans outlined above  [x]  Documenting clinical information in the EHR  []  Immunosuppression drugs changed.   []  Requested outside medical records.    Liana Gerold MD Rheumatology Division, [place] [phone])    Attestation:  Scribe Signature:  I, Mia Danielle Celestin, have scribed for Dr. Liana Gerold with the documentation for Dustin Watts on 02/07/2024 at 10:07 AM.    Physician Signature:  Dr.Annaliese Saez 02/07/2024 10:07 AM     I have reviewed this note, scribed by Jerilee Hoh Celestin, and attest that it is an accurate representation of my H & P and other events of the outpatient visit except if otherwise noted.

## 2024-02-07 ENCOUNTER — Ambulatory Visit: Payer: BLUE CROSS/BLUE SHIELD

## 2024-02-07 ENCOUNTER — Ambulatory Visit: Payer: BLUE CROSS/BLUE SHIELD | Attending: Student in an Organized Health Care Education/Training Program

## 2024-02-07 DIAGNOSIS — M1712 Unilateral primary osteoarthritis, left knee: Secondary | ICD-10-CM

## 2024-02-07 MED ADMIN — TRIAMCINOLONE ACETONIDE 40 MG/ML IJ SUSP: 40 mg | INTRA_ARTICULAR | @ 17:00:00 | Stop: 2024-02-07 | NDC 00703024101

## 2024-02-07 MED ADMIN — LIDOCAINE HCL (PF) 1 % IJ SOLN: 2 mL | INTRA_ARTICULAR | @ 17:00:00 | Stop: 2024-02-07 | NDC 55150016102

## 2024-02-07 NOTE — Patient Instructions
 Left knee steroid injection 02/07/2024     Please ice injection site few times today and tomorrow, about 10-15 minutes. Please do not overuse the injected joint/area for 48 hours after injection. Please call me for any signs of infection, such as fever, chills, local warmth, redness, and/or pain.  Please keep your appointments. Thank you!     Follow up in 6 months

## 2024-02-07 NOTE — Procedures
Procedure Note:  L knee steroid injection  Physician performing injection: Liana Gerold, MD, Rheumatology      The risks, benefits, and alternatives of the procedure were explained, and verbal consent was obtained.  The risks and benefits of the injection were discussed with the patient including but not limited to the risk of allergic reactions, infection, flare reaction, damage to local nerves, tendons or vessels, skin color changes (usually hyperpigmentation), local fat atrophy, the risk of blood sugar elevations and a hyperactivity response. The patient understands these risks elected to proceed.    Area over medial aspect of the L knees was prepped with betadyne and numbed with 1% xylocaine. 22-gauge needle was inserted, and Kenalog 40mg  + 4cc 1% xylocaine were injected into the left knee. Patient tolerated procedure well without any immediate complications. Patient was instructed to monitor for s/s of reaction to steroid and infection.  ED precaution discussed.  Ice applied immediately at site of injection.

## 2024-02-21 ENCOUNTER — Ambulatory Visit: Payer: BLUE CROSS/BLUE SHIELD | Attending: Student in an Organized Health Care Education/Training Program

## 2024-03-14 DIAGNOSIS — M79674 Pain in right toe(s): Secondary | ICD-10-CM

## 2024-03-14 NOTE — Progress Notes
 [facility]  [facility]  [address] [place]  [address]  [place]  Phone: [phone]  FAX: [phone]  [phone]  FOLLOW UP/PROGRESS NOTE    PATIENT: Dustin Watts  MRN: [id number]  DOB: Dec 26, 1955  DATE OF SERVICE: 03/15/2024    CHIEF COMPLAINT: No chief complaint on file.       HPI   Dustin Watts is a 68 y.o. male, with a history of obesity, acne, hx seizures, HLD, knee OA who presents for toe pain    #Right 1st toe pain  - Onset and MOI: 1 week ago, ***  - Laterality/location: Right 1st toe  - Description: ***  - Timing/duration: ***  - Worsened with: riding stationary bike, ***  - Improved with: ***  - Pain at worst: ***/10, currently: ***/10  - Prior Tx:      - RICE/activity modification: ***     - Medication: ***     - Rehab: ***     - Braces/orthotics: ***     - Procedures: ***     - Other: ***  - Prior surgery/injury: ***  - Prior work-up/imaging: None. ***  - Fevers/chills/weight loss: ***  - Swelling/bruising: ***  - Skin changes: ***  - Numbness/tingling: ***  - Activity level/occupation: ***  - Denies hx gout***    MEDS     No outpatient medications have been marked as taking for the 03/15/24 encounter (Appointment) with Sallie Staron B., MD.       ALLERGIES     Allergies   Allergen Reactions    Topiramate      Eye swelling    Sulfa Antibiotics Angioedema     No anaphylaxis    Amlodipine Other (See Comments)     Lip swelling without anaphylaxis    Carvedilol Angioedema     Lip swelling without anaphylaxis    Losartan Rash     No hive, no anaphylaxis, unclear if truly has rash from this medicine    Penicillins Rash     ''bumps on body''       PAST MEDICAL HX     Past Medical History:   Diagnosis Date    Acne     Arthritis     Cerebral palsy (HCC/RAF)     Eczema     Hypertension     ''White coat syndrome''    Impaired glucose tolerance     Low vitamin D level     obese     Seizure (HCC/RAF) 1973    chronically on Dilantin, no seizure in 25 years, followed by Neurologist (Dr. Oley Berth) at [facility]    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)     R foot s/p surgical removal    Shortening of arm, congenital     right arm       PAST SURGICAL HX     Past Surgical History:   Procedure Laterality Date    rectal abscess  2010    surgery done at [facility] the [facility]    Seroma Removal      s/p removal of seroma on right foot after right foot trauma       PHYSICAL EXAM   There were no vitals filed for this visit.  There is no height or weight on file to calculate BMI.      System Normal Abnormal or Comments   Constitutional []  Well-nourished, well-developed, in no acute distress      {  location:19544} Foot/Ankle exam    Gait: {Exam; neuro gait:140007::''Normal''}  Heel and toe walking: {Blank single:19197::''unable to perform heel walk'',''unable to perform toe walk'',''unable to perform heel or toe walk'',''normal''}  Achilles tendon: {Blank single:19197::''swelling'',''defect present'',''normal appearance''}  Hindfoot structure: {Blank single:19197::''valgus'',''varus'',''neutral''} heel position  Arch structure: {Blank single:19197::''pes planus'',''pes cavus'',''normal arch''}   Forefoot structure: {Blank single:19197::''valgus position'',''varus position'',''hallux valgus'',''neutral position''}  Swelling: {Blank single:19197::''***'',''lateral ankle'',''medial ankle'',''entire ankle'',''midfoot'',''forefoot'',''none''}       Palpation:    [] No TTP   [] No TTP on contralateral side  [] Tenderness to palpation at: []  Distal fibula  []  Lateral malleolus []  Peroneal tendon []  Achilles tendon  []  Medial malleolus []  Distal tibia [] Tarsal tunnel [] Sinus tarsi [] Calcaneus [] Talus [] Navicular [] Midfoot - *** [] Base of 5th MT [] Metatarsals *** []  MTPP joint *** []  Toe *** [] Other ***    ROM:   Ankle DF: 30***  Ankle PF: 50***  ROM of toes: ***    Strength:   Ankle DF: {MSK Strength (Optional):44721}  Ankle PF: {MSK Strength (Optional):44721}  Ankle Eversion: {MSK Strength (Optional):44721}  Ankle Inversion: {MSK Strength (Optional):44721}  Other: *** : {MSK Strength (Optional):44721}    Neurovascular: {Blank single:19197::''***'',''normal''} distal pulses and sensation.    Special Tests:    Right Left   Anterior Drawer {MSK Positive/Negative:44722} {MSK Positive/Negative:44722}   Talar Tilt {MSK Positive/Negative:44722} {MSK Positive/Negative:44722}   Cotton Test {MSK Positive/Negative:44722} {MSK Positive/Negative:44722}   Pain w/ External Rotation {MSK Positive/Negative:44722} {MSK Positive/Negative:44722}   Syndesmosis Squeeze Test {MSK Positive/Negative:44722} {MSK Positive/Negative:44722}   Thompson Test   {MSK Positive/Negative:44722} {MSK Positive/Negative:44722}   Homan's Sign   {MSK Positive/Negative:44722} {MSK Positive/Negative:44722}   Pain with single leg hop {MSK Positive/Negative:44722} {MSK Positive/Negative:44722}     An exam of the contralateral foot and ankle was performed and was normal except as described above***      LABS/STUDIES     Lab Studies:   Latest Reference Range & Units 07/26/23 11:00   Sodium 135 - 146 mmol/L 139   Potassium 3.6 - 5.3 mmol/L 3.9   Chloride 96 - 106 mmol/L 104   Total CO2 20 - 30 mmol/L 24   Anion Gap 8 - 19 mmol/L 11   Urea Nitrogen 7 - 22 mg/dL 10   Creatinine 1.61 - 1.30 mg/dL 0.96 (L)   Estimated GFR See GFR Additional Information mL/min/1.88m2 >89   (L): Data is abnormally low      Latest Reference Range & Units 07/26/23 11:00   AST (SGOT) 13 - 62 U/L 24   ALT (SGPT) 8 - 70 U/L 29   Alkaline Phosphatase 37 - 113 U/L 70   Bilirubin,Total 0.1 - 1.2 mg/dL 0.3   Albumin 3.9 - 5.0 g/dL 4.5   TOTAL PROTEIN 6.1 - 8.2 g/dL 7.4       Imaging Studies:   11/2022 XR Bilateral Knee  ''IMPRESSION: The previously noted material was extrinsic about the right knee. There is mild right and moderate left knee osteoarthritis. ''    A&P   Dustin [place] is a 68 y.o. male who presents for:    Problem List Items Addressed This Visit    None      1. Toe pain, right (Primary)  ***        The above recommendation were discussed with the patient.  The patient has all questions answered satisfactorily and is in agreement with this recommended plan of care.    No follow-ups on file.     Future Appointments  Provider Department Visit Type    03/15/2024 10:15 AM Allister Lessley, Errol Heaps., MD [facility] RETURN    03/21/2024 10:45 AM Clagg, Soundra Duval., MD [facility] RETURN    04/10/2024 1:00 PM Braulio Calender, NP [facility] RETURN    05/01/2024 10:00 AM Stephane Ee., MD, MPH [facility] of Digestive Diseases NEW    07/03/2024 9:00 AM Hale Level., MD [facility] [address] [facility] RETURN    07/25/2024 12:30 PM Cain Castillo., MD [facility] Surgery RETURN    07/31/2024 10:00 AM Clagg, Soundra Duval., MD [facility] Care WELL ADULT (PHYSICAL)    08/07/2024 10:00 AM Rhys Chaco, MD, MS [facility] - Rheumatology RETURN    09/25/2024 9:45 AM Melba Spittle, MD [facility] [place] DERM RETURN              Author:  Donnella Gain, MD  Family Medicine & Sports Medicine   03/14/2024 12:37 PM      #. Routine Health Maintenance:  Health Maintenance   Topic Date Due    Pneumococcal Vaccine (1 of 1 - PCV) Never done    Advance Directive  Never done    Shingles (Shingrix) Vaccine (2 of 2) 03/31/2024    Colorectal Cancer Screening  04/13/2024    COVID-19 Vaccine(Tracks primary and booster doses, not sup/immunocomp) (5 - 2024-25 season) 05/14/2024    Preventive Wellness Visit  07/25/2024    Tdap/Td Vaccine (2 - Td or Tdap) 03/08/2026    Prediabetes Screening (See hover text)  07/25/2026    Hepatitis B Screening  Completed    Influenza Vaccine  Completed    Hepatitis C Screening  Completed    Statin prescribed for ASCVD Prevention or Treatment  Completed    HIV Screening  Addressed

## 2024-03-15 ENCOUNTER — Ambulatory Visit: Payer: BLUE CROSS/BLUE SHIELD

## 2024-03-21 ENCOUNTER — Inpatient Hospital Stay: Payer: BLUE CROSS/BLUE SHIELD

## 2024-03-21 ENCOUNTER — Ambulatory Visit: Payer: BLUE CROSS/BLUE SHIELD

## 2024-03-21 DIAGNOSIS — M79671 Pain in right foot: Secondary | ICD-10-CM

## 2024-03-21 DIAGNOSIS — R300 Dysuria: Secondary | ICD-10-CM

## 2024-03-21 NOTE — Patient Instructions
-   You can use over-the-counter clotrimazole cream twice a day on your feet until the discomfort goes away

## 2024-03-21 NOTE — Progress Notes
 [facility] Primary [facility]  Internal Medicine-Pediatrics    PATIENT: Dustin Watts  MRN: [id number]  DOB: November 08, 1956  DATE OF SERVICE: 03/21/2024    CHIEF COMPLAINT:   Chief Complaint   Patient presents with    Toe Injury     Right foot pt was on cycling bike and twisted foot when he got off    urine pain        HPI & Other Histories   Dustin Watts is a 68 y.o. male with hx below who presents for right hallux pain and dysuria    - was exercising on a bike last week  - hit his foot on the bike and twisted his foot   - he's noticed his right foot is more swollen  - no meds taken for this but did apply tea tree oil and olive oil on his foot for suspected athlete's foot     - dysuria yesterday  - no hematuria  - no fevers   - associated headache, nausea and decreased appetite    Past Medical History:   Diagnosis Date    Acne     Arthritis     Cerebral palsy (HCC/RAF)     Eczema     Hypertension     ''White coat syndrome''    Impaired glucose tolerance     Low vitamin D level     obese     Seizure (HCC/RAF) 1973    chronically on Dilantin, no seizure in 25 years, followed by Neurologist (Dr. Oley Berth) at [facility]    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)     R foot s/p surgical removal    Shortening of arm, congenital     right arm     Past Surgical History:   Procedure Laterality Date    rectal abscess  2010    surgery done at [facility] of the [facility]    Seroma Removal      s/p removal of seroma on right foot after right foot trauma     Family History   Problem Relation Age of Onset    Heart disease Mother     Diabetes Mother     Kidney disease Mother     Stroke Brother     Colon cancer Brother 50    Prostate cancer Neg Hx     Ovarian cancer Neg Hx     Breast cancer Neg Hx     Hyperlipidemia Neg Hx     Hypertension Neg Hx     Anesthesia problems Neg Hx     Malignant hyperthermia Neg Hx     Hypotension Neg Hx     Malignant hypertension Neg Hx      Social History     Tobacco Use    Smoking status: Never     Passive exposure: Never    Smokeless tobacco: Never   Vaping Use    Vaping status: Never Used   Substance Use Topics    Alcohol use: No    Drug use: No     Allergies   Allergen Reactions    Topiramate      Eye swelling    Sulfa Antibiotics Angioedema     No anaphylaxis    Amlodipine Other (See Comments)     Lip swelling without anaphylaxis    Carvedilol Angioedema     Lip swelling without anaphylaxis    Losartan Rash     No hive, no anaphylaxis, unclear if truly  has rash from this medicine    Penicillins Rash     ''bumps on body''       MEDS     Outpatient Medications Prior to Visit   Medication Sig    acetaminophen 325 mg tablet Take 2 tablets (650 mg total) by mouth every six (6) hours as needed (mild pain).    benzoyl peroxide-erythromycin 5-3% gel Apply to acne once daily.    Calcium Carb-Cholecalciferol 500-10 MG-MCG CHEW Chew 1 tablet by mouth daily.    diclofenac Sodium 1% gel Apply 2 g topically four (4) times daily as needed.    DILANTIN 100 MG ER capsule Take 4 capsules (400 mg total) by mouth at bedtime.    docusate 100 mg capsule Take 1 capsule (100 mg total) by mouth as needed for.    ibuprofen 400 mg tablet Take 1 tablet (400 mg total) by mouth daily as needed With food.    ibuprofen 600 mg tablet Take 1 tablet (600 mg total) by mouth daily as needed.    sulfacetamide-sulfur 9-4.5 % LIQD liquid Apply to face twice daily    Tretinoin Microsphere (RETIN-A MICRO PUMP) 0.06 % GEL APPLY A PEA SIZED AMOUNT TOPICALLY TO AFFECTED AREA OF FACE NIGHTLY    aspirin (ASPIRIN LOW DOSE) 81 mg EC tablet TAKE 1 TABLET BY MOUTH EVERY DAY (Patient not taking: Reported on 03/21/2024)    atorvastatin 10 mg tablet Take 1 tablet (10 mg total) by mouth at bedtime. (Patient not taking: Reported on 03/21/2024)    naproxen 500 mg tablet Take 1 tablet (500 mg total) by mouth two (2) times daily. (Patient not taking: Reported on 03/21/2024)    oxyCODONE 5 mg tablet Take 1-2 tablets (5-10 mg total) by mouth every four (4) hours as needed for Moderate Pain (Pain Scale 4-6) or Severe Pain (Pain Scale 7-10). Max Daily Amount: 60 mg (Patient not taking: Reported on 03/21/2024)    semaglutide (WEGOVY) 0.25 mg/0.5 mL SC injection pen Inject 0.5 mLs (0.25 mg total) under the skin once a week. (Patient not taking: Reported on 03/21/2024)     No facility-administered medications prior to visit.       PHYSICAL EXAM      Last Recorded Vital Signs:    03/21/24 1102   BP: 138/87   Pulse: (!) 101   Temp:    SpO2:      Body mass index is 29.97 kg/m?Aaron Aas    General - Awake and alert, NAD  CVS - S1 and S2 heard, RRR (around 92 bpm), no murmurs  Pulm - Lungs CTAB, Good respiratory effort  GI - BS+, Nontender to palpation  GU - no CVA tenderness or suprapubic tenderness  Eyes - Clear conjunctiva  MSK - grossly normal appearance of feet, cool to touch, no frank swelling, dorsalis pedis pulses 2+ bilaterally     LABS/STUDIES   I have:   [x]  Reviewed/ordered []  1 []  2 [x]  >= 3 unique laboratory, radiology, and/or diagnostic tests noted below    []  Reviewed []  1 []  2 []  >= 3 prior external notes and incorporated into patient assessment    []  Discussed management or test interpretation with external provider(s) as noted      A&P   Dustin Watts is a a 68 y.o. male presenting for   Chief Complaint   Patient presents with    Toe Injury     Right foot pt was on cycling bike and twisted foot when he got off  urine pain       1. Right foot pain (Primary)  - normal exam today but due to persistent pain after foot injury noted in HPI, will eval for fracture with:  - XR foot ap+lat+obl right (3 views); Future  - advised OTC clotrimazole for possible athlete's foot due to patient's concern - no obvious signs of tinea pedis today on exam    2. Dysuria  - stable exam - unclear etiology  - POCT urinalysis dipstick; Future - neg for nitrites/LE  - monitoring further:  - Urinalysis w/Reflex to Culture; Future    The above recommendations were discussed with the patient. The patient had all questions answered satisfactorily and is in agreement with this recommended plan of care.    Author:    Soundra Duval Graden Hoshino   03/21/2024   11:09 AM

## 2024-03-24 ENCOUNTER — Telehealth: Payer: MEDICAID

## 2024-03-24 NOTE — Telephone Encounter
 Call patient, no answer.  LVM and relay message from Dr. Marcelo Serum.     ----- Message from Herminia Lope, MD sent at 03/24/2024 11:17 AM PDT -----  Please let patient know there is no fracture of the foot. Any other concerns require a visit.  ----- Message -----  From: Interface, External Ris In  Sent: 03/22/2024   9:00 AM PDT  To: Herminia Lope, MD

## 2024-03-27 ENCOUNTER — Telehealth: Payer: MEDICAID

## 2024-03-27 NOTE — Telephone Encounter
 Results Request - The patient would like to discuss the results of their recent tests.     1) What type of test(s)? UA Labs     2) When was it performed? 03/21/24    3) Where was it performed? [place]     If [place], are results available in [facility]? yes  If outside facility, what is their phone number?     Patient or caller has been notified of the turnaround time of 1-2 business day(s).

## 2024-03-28 NOTE — Telephone Encounter
 Called patient letting him know Dr Marcelo Serum also ordered PSA screening pt states he will try and go today or tomorrow for labs

## 2024-03-28 NOTE — Telephone Encounter
 Patient reported that it still stings when he urinates and his pubic area also hurts.  Requesting if MD can place PSA test for prostate.  I already inform patient that MD already place an order for Urinalysis (pt will try to go to the lab asap to provide urine sample).

## 2024-04-07 ENCOUNTER — Telehealth: Payer: MEDICARE

## 2024-04-07 NOTE — Telephone Encounter
 scrubbed, left vm to confirm appt

## 2024-04-10 ENCOUNTER — Ambulatory Visit: Payer: PRIVATE HEALTH INSURANCE

## 2024-04-10 ENCOUNTER — Ambulatory Visit: Payer: MEDICAID | Attending: Acute Care

## 2024-04-10 NOTE — Progress Notes
 Visit did not occur. Appt rescheduled

## 2024-04-11 ENCOUNTER — Ambulatory Visit: Payer: MEDICAID | Attending: Student in an Organized Health Care Education/Training Program

## 2024-04-11 DIAGNOSIS — R300 Dysuria: Secondary | ICD-10-CM

## 2024-04-11 DIAGNOSIS — T50905S Adverse effect of unspecified drugs, medicaments and biological substances, sequela: Secondary | ICD-10-CM

## 2024-04-11 NOTE — Progress Notes
 [facility]  [address] [place], [address] [place] [address]  Phone: [phone]        Fax: [phone]    PATIENT:  Dustin Watts  MRN:  [id number]  DOB:  Apr 02, 1956  DATE OF SERVICE:  04/11/2024    PRIMARY CARE PROVIDER: Herminia Lope., MD    Chief Complaint   Patient presents with    Follow-up     Foot pain          Subjective:     Dustin Watts is a 68 y.o. male with past medical history below presents due to persistent dysuria.    Presented to [facility] ER for dysuria and leg swelling. US  neg for DVT. UA negative. Treated empirically with Macrobid. Went to another ER for dysuria and prescribed Keflex (pt is a poor historian and it is unclear when this occurred relative to [facility] visit). Symptoms had briefly improved on Macrobid but recurred once he completed antibiotics. Last night, he presented to [facility] ER after developing an allergic reaction to AZO which he took for persistent dysuria. Prescribed Benadryl, Pepcid, and Macrobid and advised outpatient f/u with A&I and Urology. Today he denies fever or flank pain      Past Medical History  Past Medical History:   Diagnosis Date    Acne     Arthritis     Cerebral palsy (HCC/RAF)     Eczema     Hypertension     ''White coat syndrome''    Impaired glucose tolerance     Low vitamin D level     obese     Seizure (HCC/RAF) 1973    chronically on Dilantin, no seizure in 25 years, followed by Neurologist (Dr. Oley Berth) at [facility]    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)     R foot s/p surgical removal    Shortening of arm, congenital     right arm       Medications  Outpatient Medications Prior to Visit   Medication Sig    acetaminophen 325 mg tablet Take 2 tablets (650 mg total) by mouth every six (6) hours as needed (mild pain).    benzoyl peroxide-erythromycin 5-3% gel Apply to acne once daily.    Calcium Carb-Cholecalciferol 500-10 MG-MCG CHEW Chew 1 tablet by mouth daily.    diclofenac Sodium 1% gel Apply 2 g topically four (4) times daily as needed.    DILANTIN 100 MG ER capsule Take 4 capsules (400 mg total) by mouth at bedtime.    docusate 100 mg capsule Take 1 capsule (100 mg total) by mouth as needed for.    ibuprofen 400 mg tablet Take 1 tablet (400 mg total) by mouth daily as needed With food.    ibuprofen 600 mg tablet Take 1 tablet (600 mg total) by mouth daily as needed.    sulfacetamide-sulfur 9-4.5 % LIQD liquid Apply to face twice daily    Tretinoin Microsphere (RETIN-A MICRO PUMP) 0.06 % GEL APPLY A PEA SIZED AMOUNT TOPICALLY TO AFFECTED AREA OF FACE NIGHTLY    aspirin (ASPIRIN LOW DOSE) 81 mg EC tablet TAKE 1 TABLET BY MOUTH EVERY DAY (Patient not taking: Reported on 04/11/2024)    atorvastatin 10 mg tablet Take 1 tablet (10 mg total) by mouth at bedtime. (Patient not taking: Reported on 02/07/2024)    naproxen 500 mg tablet Take 1 tablet (500 mg total) by mouth two (2) times daily. (Patient not taking:  Reported on 02/07/2024)    oxyCODONE 5 mg tablet Take 1-2 tablets (5-10 mg total) by mouth every four (4) hours as needed for Moderate Pain (Pain Scale 4-6) or Severe Pain (Pain Scale 7-10). Max Daily Amount: 60 mg (Patient not taking: Reported on 02/07/2024)    semaglutide (WEGOVY) 0.25 mg/0.5 mL SC injection pen Inject 0.5 mLs (0.25 mg total) under the skin once a week. (Patient not taking: Reported on 02/07/2024)     No facility-administered medications prior to visit.         Objective:     BP 133/82  ~ Pulse 81  ~ Resp 17  ~ Wt 221 lb (100.2 kg)  ~ SpO2 95%  ~ BMI 29.97 kg/m?  Body mass index is 29.97 kg/m?Dustin Watts    GEN: appears well-developed and well-nourished, no apparent distress  HEAD: normocephalic, atraumatic  EYES: lids and lashes normal, conjunctivae and sclerae normal. EOMI, PEERL  EARS: External pinnae normal. TMs intact and well visualized bilaterally.  NOSE: nasal septum, turbinates, and mucosa normal  MOUTH: oropharynx clear, no tonsillar swelling or exudates  NECK: Supple, normal range of motion. No thyromegaly. No LAD  CV: Regular rate and rhythm; no murmur heard. 2+ radial pulses equal and bilateral.  RESP: Clear to auscultation bilaterally, good effort. No respiratory distress. Speaking in full sentences.  GI: Soft. Bowel sounds are normal; exhibits no distension; no tenderness.   MSK: Normal range of motion;  exhibits no edema.   NEURO: A&O x4, intact strength and sensation in extremities, no focal deficits   SKIN: Skin is warm and dry without rashes.   PSYCH: normal mood and affect.       LABS/STUDIES     Lab Studies:  (reviewed by me)  URINALYSIS W/REFLEX TO CULTURE (04/03/2024 12:20 PM PDT)  Lab Results - URINALYSIS W/REFLEX TO CULTURE (04/03/2024 12:20 PM PDT)  Component Value Ref Range Test Method Analysis Time Performed At Pathologist Signature   Color, UA Straw Yellow     [facility]     Clarity, UA Clear Clear     [facility]     Specific Gravity, UA 1.010 1.005 - 1.030     [facility]     Blood UA Negative Negative     [facility]     Nitrite UA Negative Negative     [facility]     UA pH 7.0 5.0 - 8.0     [facility]     UA Protein Negative Negative     [facility]     UA Glucose Negative Negative     [facility]     UA Ketones Negative Negative     [facility]     Urobilinogen 0.2 0.2     [facility]     Bili UA Negative Negative     [facility]     Leuk Est UA Negative Negative     [facility]     RBC, UA <1 0 - 5 /HPF     [facility]     WBC, UA 1 0 - 5 /HPF     [facility]     Bacteria, UA Negative Few     [facility]     UA Squam Epithelial <1 0 - 5 /HPF     [facility]     UA Hyal Cast <1 0 - 5 /LPF     [facility]         Imaging Studies: (reviewed by  me)  LLE Venous Duplex 04/06/24  04/06/2024 3:17 PM PDT     1. No deep or superficial venous thrombosis of the left leg.  2. The contralateral common femoral vein is within normal limits.       Assessment & Plan:   Dustin Watts is a 68 y.o. male presents for  Chief Complaint   Patient presents with    Follow-up     1. Adverse effect of drug, sequela (Primary)  - Referral to  Allergy and Immunology    2. Dysuria  - Continue Macrobid if achieving symptomatic relief  - Referral to Urology, Adult Urology  - Urogenital Ureaplasma and Mycoplasma Species by PCR Urine; Future  - Chlamydia trachomatis/Neisseria gonorrhoeae PCR, Urine; Future          Future Appointments   Date Time Provider Department Center   04/11/2024  2:45 PM Merriam Abbey., MD [facility] B[facility] [place]   05/01/2024 10:00 AM Stephane Ee., MD, MPH [facility] [place]/Cen   07/03/2024  9:00 AM Hale Level., MD [facility] [id number] [place]/Cen   07/25/2024 12:30 PM Cain Castillo., MD [facility] [address] [place]/Cen   07/31/2024 10:00 AM Clagg, Soundra Duval., MD [facility] BUR [place]   08/07/2024 10:00 AM Rhys Chaco, MD, MS [facility] [place]/Cen   09/25/2024  9:45 AM Melba Spittle, MD [facility] [place]   09/25/2024  1:00 PM Braulio Calender, NP [facility] [address] [place]/Cen           Author:    Merriam Abbey, MD  [facility]  04/11/24 2:39 PM

## 2024-04-11 NOTE — Patient Instructions
 Referrals have been placed for Allergy and Urology. You can view the referral under the ''referrals'' tab in MyChart, and once authorized you can schedule your appointment.    Start Benadryl and Pepcid as prescribed. Follow up with your PCP if swelling doesn't improve in 2-4 weeks    Complete antibiotics for possible urine infection.

## 2024-04-13 LAB — Chlamydia trachomatis/Neisseria gonorrhoeae PCR

## 2024-04-15 LAB — Urogenital Ureaplasma and Mycoplasma Species by PCR: MYCOPLASMA HOMINIS BY PCR: NOT DETECTED

## 2024-04-28 ENCOUNTER — Telehealth: Payer: PRIVATE HEALTH INSURANCE

## 2024-04-28 NOTE — Telephone Encounter
 Called to confirm appt, pt confirmed  Dustin Watts

## 2024-05-01 ENCOUNTER — Ambulatory Visit: Payer: MEDICAID | Attending: Gastroenterology

## 2024-05-01 DIAGNOSIS — Z800 Family history of malignant neoplasm of digestive organs: Secondary | ICD-10-CM

## 2024-05-01 DIAGNOSIS — Z1211 Encounter for screening for malignant neoplasm of colon: Secondary | ICD-10-CM

## 2024-05-01 MED ORDER — PEG 3350-KCL-NABCB-NACL-NASULF 236 G PO SOLR
4000 mL | Freq: Once | ORAL | 0 refills | Status: AC
Start: 2024-05-01 — End: ?

## 2024-05-01 NOTE — Progress Notes
 [facility]  [address], [address]  [place] [address]  Phone: [phone]  Fax: [phone]      REFERRING PRACTITIONER: Kahlon, Kanwarpal S., MD  PRIMARY CARE PROVIDER: Clagg, Bernardino LABOR., MD    CHIEF COMPLAINT:   Chief Complaint   Patient presents with    New Consult     Colo consult       HPI:  Dustin Watts is a 68 y.o.male who presents for evaluation of screening colonoscopy.  Hx of cerebral palsy/epilepsy.  No issues with diarrhea/constipation.  Patient denies melena, rectal bleeding, change in bowel habits and weight loss.   Patient denies nausea, vomiting, dysphagia, or hematemesis.   Hx of rectal abscess s/p drainage 20 years ago.   Has noted some occasional discomfort in anal area when sitting.  Brother with colon cancer at 50yo.    Notes sore throat and chest pressure which started recently.   No acid reflux.          Prior Endoscopy:  Colo >10 years ago (OSH do not have report)     ROS:  A 14-point review of systems was negative except where noted in the HPI.     MEDICATIONS:  Medications that the patient states to be currently taking   Medication Sig    acetaminophen  325 mg tablet Take 2 tablets (650 mg total) by mouth every six (6) hours as needed (mild pain).    benzoyl peroxide -erythromycin  5-3% gel Apply to acne once daily.    Calcium  Carb-Cholecalciferol  500-10 MG-MCG CHEW Chew 1 tablet by mouth daily.    diclofenac  Sodium 1% gel Apply 2 g topically four (4) times daily as needed.    DILANTIN  100 MG ER capsule Take 4 capsules (400 mg total) by mouth at bedtime.    docusate 100 mg capsule Take 1 capsule (100 mg total) by mouth as needed for.    ibuprofen  400 mg tablet Take 1 tablet (400 mg total) by mouth daily as needed With food.    ibuprofen  600 mg tablet Take 1 tablet (600 mg total) by mouth daily as needed.    sulfacetamide -sulfur  9-4.5 % LIQD liquid Apply to face twice daily    Tretinoin  Microsphere (RETIN-A  MICRO PUMP) 0.06 % GEL APPLY A PEA SIZED AMOUNT TOPICALLY TO AFFECTED AREA OF FACE NIGHTLY       ALLERGIES:  Allergies   Allergen Reactions    Topiramate      Eye swelling    Sulfa Antibiotics Angioedema     No anaphylaxis    Amlodipine  Other (See Comments)     Lip swelling without anaphylaxis    Carvedilol Angioedema     Lip swelling without anaphylaxis    Losartan Rash     No hive, no anaphylaxis, unclear if truly has rash from this medicine    Penicillins Rash     ''bumps on body''       PMHx:  Past Medical History:   Diagnosis Date    Acne     Arthritis     Cerebral palsy (HCC/RAF)     Eczema     Hypertension     ''White coat syndrome''    Impaired glucose tolerance     Low vitamin D  level     obese     Seizure (HCC/RAF) 1973    chronically on Dilantin , no seizure in 25 years, followed by Neurologist (Dr. [name]) at [facility]    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)  R foot s/p surgical removal    Shortening of arm, congenital     right arm       SURGHx:  Past Surgical History:   Procedure Laterality Date    rectal abscess  2010    surgery done at [facility]    Seroma Removal      s/p removal of seroma on right foot after right foot trauma       FAMHx:  Family History   Problem Relation Age of Onset    Heart disease Mother     Diabetes Mother     Kidney disease Mother     Stroke Brother     Colon cancer Brother 50    Prostate cancer Neg Hx     Ovarian cancer Neg Hx     Breast cancer Neg Hx     Hyperlipidemia Neg Hx     Hypertension Neg Hx     Anesthesia problems Neg Hx     Malignant hyperthermia Neg Hx     Hypotension Neg Hx     Malignant hypertension Neg Hx        SOCIALHx:  Social History     Socioeconomic History    Marital status: Married    Number of children: 0   Occupational History    Occupation: Audiological scientist estate     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 No    Types of Exercise? (List in Comments) Yes     Comment: biking infrequently, abdominal exercise    Do you follow a special diet? No    Vegan? No    Vegetarian? No    Pescatarian? No    Lactose Free? No    Gluten Free? No    Omnivore? Yes     Social Drivers of Psychologist, prison and probation services Strain: Low Risk  (04/24/2024)    Received from [facility]    Overall Financial Resource Strain (CARDIA)     Difficulty of Paying Living Expenses: Not hard at all       PHYSICAL EXAM: BP 135/76 (BP Location: Left arm, Patient Position: Sitting, Cuff Size: Large)  ~ Pulse (!) 104  ~ Temp 36.8 ?C (98.3 ?F) (Forehead)  ~ Ht 6' (1.829 m)  ~ Wt 212 lb (96.2 kg)  ~ SpO2 97% Comment: On RA ~ BMI 28.75 kg/m?   Constitutional: Well developed, well nourished, alert, cooperative, and in no acute distress  Head: Normocephalic, without obvious abnormality, atraumatic  Eyes: Anicteric sclera.EOM intact.   Respiratory: Nonlabored breathing  Abdomen:Soft, non-distended, non-tender, no rebound tenderness or guarding, no palpable hepatosplenomegaly.   Neurological: Alert, oriented x 3, normal affect    Lab Review:  Lab Results   Component Value Date    WBC 5.54 10/25/2023    HGB 14.3 10/25/2023    HCT 43.0 10/25/2023    MCV 98.9 (H) 10/25/2023    PLT 178 10/25/2023  INR 1.1 05/27/2017    APTT 27.9 05/27/2017     Lab Results   Component Value Date    AST 24 07/26/2023    ALT 29 07/26/2023    ALKPHOS 70 07/26/2023    BILITOT 0.3 07/26/2023    ALBUMIN 4.5 07/26/2023    TOTPRO 7.4 07/26/2023     Lab Results   Component Value Date    NA 139 07/26/2023    K 3.9 07/26/2023    CL 104 07/26/2023    CO2 24 07/26/2023    BUN 10 07/26/2023    CREAT 0.58 (L) 07/26/2023    GFR ... 04/18/2014    CALCIUM  9.1 07/26/2023     Lab Results   Component Value Date    SRWEST 13 (H) 05/27/2017     Lab Results   Component Value Date    INR 1.1 05/27/2017    FE 113 07/26/2023    FEBINDSAT 38 07/26/2023    TIBC 296 07/26/2023    FERRITIN 92 07/26/2023    VITAMINB12 2,957 (H) 07/26/2023    FOLATE 35.7 (H) 07/26/2023     Lab Results   Component Value Date    SRWEST 13 (H) 05/27/2017     No results found for: ''HELPYLAB'', ''HELPYLAGSTL''   No results found for: ''CALPROTSTL''      PROCEDURES:          IMAGING:                IMPRESSION / PLAN:  68 y.o.male with:    1. Colon cancer screening    2. Family history of colon cancer    3. Anemia, unspecified type       Presents for screening colonoscopy evaluation. Family hx of crc in brother. Discussed procedure and bowel preparation at length. Risks and benefits of the procedure were discussed with the patient and all questions were answered, patient wishes to proceed with endoscopic evaluation.  Mild anemia, no iron deficiency, with spontaneous resolution on repeat labs. No overt bleeding. Colo for crc screening.    Plan:  Patient Instructions   - schedule your colonoscopy   - To schedule your procedure, please call the Scheduling office at [phone].   Schedule with Dr. Ninfa on a Tuesday or Wed or Friday, per your preference.  - bowel prep: [email or web address]  [email or web address]     - Follow-up with PCP for sore throat, urinary issues    Orders Placed This Encounter    GI Endoscopy Procedures    PEG 3350 /electrolytes 236 g solution        The above plan of care, diagnosis, orders, and follow-up were discussed with the patient.  Questions related to this recommended plan of care were answered.    Medical Decision Making addressed on 05/01/2024:  Number and Complexity of Problems Addressed at the Encounter:   [x]   1 or more chronic illness with exacerbation, progression, or side effects of treatment  []   2 or more stable chronic illness  []   1 undiagnosed new problem with uncertain diagnosis  []   1 acute illness with systemic symptoms  []   1 acute complicated injury     Review of Data: I have  [x]  Reviewed/ordered >= 3 unique laboratory, radiology, and/or diagnostic tests noted    []  Reviewed prior external notes and incorporated into patient assessment as noted  []  I have independently interpreted test performed by other physician(s) as noted   []  Discussed management or test interpretation with external provider(s) as noted  Risk of Complication and or Morbidity or Mortality of Patient Management including Social Determinants of Health:   [x]   I deem the above diagnoses to have a risk of complication, morbidity or mortality of Moderate   []  The diagnosis or treatment of said conditions is significantly limited by social determinants of health as noted.       Thank you for this consult.  Please contact me if you have any questions.    Blenda ALONSO Glasser, MD, MPH  [employer]  Department of Medicine, Gastroenterology  [facility] Division of Digestive Diseases  [facility]

## 2024-05-01 NOTE — Patient Instructions
-   schedule your colonoscopy   - To schedule your procedure, please call the Scheduling office at [phone].   Schedule with Dr. Ninfa on a Tuesday or Wed or Friday, per your preference.  - bowel prep: [email or web address]  [email or web address]     - Follow-up with PCP for sore throat, urinary issues

## 2024-05-15 ENCOUNTER — Ambulatory Visit: Payer: Medicare Drug Benefit

## 2024-05-15 DIAGNOSIS — J029 Acute pharyngitis, unspecified: Secondary | ICD-10-CM

## 2024-05-15 DIAGNOSIS — R0989 Other specified symptoms and signs involving the circulatory and respiratory systems: Secondary | ICD-10-CM

## 2024-05-15 DIAGNOSIS — R300 Dysuria: Secondary | ICD-10-CM

## 2024-05-15 MED ORDER — AZITHROMYCIN 500 MG PO TABS
500 mg | ORAL_TABLET | Freq: Every day | ORAL | 0 refills | 29.00000 days | Status: AC
Start: 2024-05-15 — End: ?

## 2024-05-15 NOTE — Progress Notes
 [facility]  [facility]  [address] [place]  [address]  [place] [address]  Phone: [phone]  FAX: [phone]  [phone]    PATIENT: Dustin Watts  MRN: [id number]  DOB: 04-06-1956  DATE OF SERVICE: 05/15/2024  PCP: Cline Bernardino LABOR., MD   CHIEF COMPLAINT:   Chief Complaint   Patient presents with    Urinary Tract Infection        HPI   Dustin Watts is a 68 y.o. male who presents for   Chief Complaint   Patient presents with    Urinary Tract Infection       Patient previously saw a family med provider 04/11/2024. Also reports he saw 4 emergency doctors. Has been prescribed macrobid initially, then was prescribed keflex. Then the other er doctor gave him the same macrobid.States he took macrobid for 3 days and ''veins started to pop out'' and felt nasueated so he stopped them  States that he went to ER and was prescribed keflex (not sure what side effect he had)  Right now, just taking cranberry juice and camomile tea   Reports he is still having dysuria. No blood in urine, fever, chills.   Has upcoming appointment with uro    Reports that he has chest cold  Started after last visit with primary provider in June ( 3 days after)  Has been taking lemon tea  Reports that he has sore throat  Denies feeling feverish.   Reports that he has dry cough   Denies sob   Reports that it is doing better now.     Past Medical History:   Diagnosis Date    Acne     Arthritis     Cerebral palsy (HCC/RAF)     Eczema     Hypertension     ''White coat syndrome''    Impaired glucose tolerance     Low vitamin D  level     obese     Seizure (HCC/RAF) 1973    chronically on Dilantin , no seizure in 25 years, followed by Neurologist (Dr. [name]) at [facility]    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)     R foot s/p surgical removal    Shortening of arm, congenital     right arm       MEDS     No outpatient medications have been marked as taking for the 05/15/24 encounter (Office Visit) with Laurier Sensing, DO.       PHYSICAL EXAM     Last Recorded Vital Signs:    05/15/24 0853   BP: 135/82   Pulse: 82   Temp: 36.8 ?C (98.3 ?F)   SpO2: 95%     Body mass index is 29.02 kg/m?SABRA    System Check if normal Positive or additional negative findings   Constit  [x]  General appearance     Eyes  [x]  Conj/Lids []  Pupils  []  Fundi     HENMT  [x]  External ears/nose []  Otoscopy   [x]  Gross Hearing []  Nasal mucosa   []  Lips/teeth/gums []  Oropharynx    []  mucus membranes []  Head  No exudates noted in posterior orpharynx    Neck  []  Inspection/palpation []  Thyroid     Resp  [x]  Effort []  Wheezing    [x]  Auscultation  []  Crackles     CV  [x]  Rhythm/rate   [x]  Murmurs   []  LEE   []  JVP non-elevated  Normal pulses:   []  Radial []  Femoral  []  Pedal     Breast  []  Inspection []  Palpation     GI  []  abd masses    []  tenderness   []  rebound/guarding   []  Liver/spleen []  Rectal     GU  M: []  Scrotum []  Penis []  Prostate   F:  []  External []  vaginal wall        []  Cervix  []  mucus        []  Uterus    []  Adnexa      Lymph  []  Neck []  Axillae []  Groin     MSK Specify site examined:    []  Inspect/palp []  ROM   []  Stability []  Strength/tone         Skin  []  Inspection []  Palpation     Neuro  []  CN2-12 intact grossly   []  Alert and oriented   []  DTR      []  Muscle strength      []  Sensation   []  Gait/balance     Psych  []  Insight/judgement     []  Mood/affect    []  Gross cognition            A&P   Dustin Watts is a 68 y.o. male presenting for   Chief Complaint   Patient presents with    Urinary Tract Infection         PROBLEM & ORDERS    ICD-10-CM    1. Dysuria  R30.0 POCT urinalysis dipstick      2. Chest congestion  R09.89 COVID-19  PCR/TMA, (Asst) Mid-turbinate     POCT rapid strep A      3. Sore throat  J02.9 COVID-19  PCR/TMA, (Asst) Mid-turbinate     POCT rapid strep A     azithromycin  500 mg tablet          ASSESSMENT/PLAN  1. Dysuria (Primary)  Pt endorses multiple ER visits and also PCP visit due to dysuria. Pt denies hematuria. Noted increased frequency but attributes this to drinking more water. Was prescribed macrobid and keflex in the past which he states he could not tolerate. POCT UA today negative nitrites or leukocytes. Lower suspicion for acute UTI.   - POCT urinalysis dipstick  - recommend follow up with urology as previously referred, hast upcoming appt 06/15/2024    2. Chest congestion  3. Sore throat; POCT strep positive  - COVID-19  PCR/TMA, (Asst) Mid-turbinate; Future  - POCT rapid strep A: Positive  - pt with hx of penicillin allergy. Prescribed azithromycin  500 mg 1 po every day x 3 day  - Recommend plenty of rest and fluids  - OTC Tylenol  as needed for fevers and body aches  - Hot teas, warm salt water gargles, menthol lozenges, chloroseptic spray for sore throat  - Saline nasal sprays or Netipot (Nasal saline irrigation) for congestion  - Robitussin or Mucinex-DM for cough/mucus  - Counseled on return/ER precautions, including severe shortness of breath, chest pain and decreased consciousness      The above recommendation were discussed with the patient.  The patient has all questions answered satisfactorily and is in agreement with this recommended plan of care.    Return if symptoms worsen or fail to improve.     Future Appointments   Date Time Provider Department Center   06/15/2024  1:00 PM Jude Charlie HERO., MD URO MP2 [address] [place]/Cen   07/03/2024  9:00 AM Kahlon, Kanwarpal S., MD  HEMON [id number] [place]/Cen   07/25/2024 12:30 PM Derinda Oneil BIRCH., MD SUR PANC [address] [place]/Cen   07/31/2024 10:00 AM Clagg, Bernardino LABOR., MD [facility] BUR [place]   08/07/2024 10:00 AM Verl Balling, MD, MS RHEUM MSS [place]/Cen   08/07/2024 11:15 AM Clagg, Bernardino LABOR., MD [facility] BUR [place]   09/06/2024 10:30 AM Adron Ramey SAILOR., MD ALLERGY [facility] [place]/Cen   09/25/2024  9:45 AM Kaleen Coy, MD Derm Enc [address] [place]   09/25/2024  1:00 PM Veta Deforest, NP NEUEPLP [address] [place]/Cen       Author:    Steffan Benjamin, D.O., 05/15/2024

## 2024-05-16 ENCOUNTER — Telehealth: Payer: MEDICAID

## 2024-05-16 LAB — COVID-19 PCR/TMA

## 2024-05-16 NOTE — Telephone Encounter
 Called and spoke with patient.  Relayed covid test result as per MD.    MDs message:

## 2024-05-23 MED ORDER — PHENYTOIN SODIUM EXTENDED 100 MG PO CAPS
0 refills
Start: 2024-05-23 — End: ?

## 2024-06-08 MED ORDER — DILANTIN 100 MG PO CAPS
400 mg | ORAL_CAPSULE | Freq: Every evening | ORAL | 8 refills
Start: 2024-06-08 — End: ?

## 2024-06-09 MED ORDER — DILANTIN 100 MG PO CAPS
400 mg | ORAL_CAPSULE | Freq: Every evening | ORAL | 3 refills | 10.00000 days | Status: AC
Start: 2024-06-09 — End: ?

## 2024-06-15 ENCOUNTER — Ambulatory Visit: Payer: MEDICAID

## 2024-07-03 ENCOUNTER — Ambulatory Visit: Payer: MEDICARE

## 2024-07-03 ENCOUNTER — Telehealth: Payer: MEDICAID

## 2024-07-03 NOTE — Telephone Encounter
 Call Back Request      Reason for call back: Patient would like to cancel his appointment for today and would like I call back to reschedule.    Any Symptoms:  []  Yes  []  No      If yes, what symptoms are you experiencing:    Duration of symptoms (how long):    Have you taken medication for symptoms (OTC or Rx):      If call was taken outside of clinic hours:    [] Patient or caller has been notified that this message was sent outside of normal clinic hours.     [] Patient or caller has been warm transferred to the physician's answering service. If applicable, patient or caller informed to please call us  back if symptoms progress.  Patient or caller has been notified of the turnaround time of 1-2 business day(s).

## 2024-07-03 NOTE — Telephone Encounter
 R/s RV

## 2024-07-05 MED ORDER — RETIN-A MICRO PUMP 0.06 % EX GEL
0 refills
Start: 2024-07-05 — End: ?

## 2024-07-07 MED ORDER — RETIN-A MICRO PUMP 0.06 % EX GEL
11 refills
Start: 2024-07-07 — End: ?

## 2024-07-11 ENCOUNTER — Telehealth: Payer: Medicare Drug Benefit

## 2024-07-11 MED ORDER — RETIN-A MICRO PUMP 0.06 % EX GEL
ORAL | 11 refills | 30.00000 days | Status: AC
Start: 2024-07-11 — End: ?

## 2024-07-11 NOTE — Telephone Encounter
 Call Back Request      Reason for call back: Pt called in to follow up on MRI and EEG referrals mentioned at this LOV 10/11/23. States he has tried to schedule but referrals are not on CC so unable to, please advise Pt when referrals have been placed, would like to be seen at [facility] for both, thank you.     Any Symptoms:  []  Yes  [x]  No      If yes, what symptoms are you experiencing:    Duration of symptoms (how long):    Have you taken medication for symptoms (OTC or Rx):      If call was taken outside of clinic hours:    [] Patient or caller has been notified that this message was sent outside of normal clinic hours.     [] Patient or caller has been warm transferred to the physician's answering service. If applicable, patient or caller informed to please call us  back if symptoms progress.  Patient or caller has been notified of the turnaround time of 1-2 business day(s).

## 2024-07-24 ENCOUNTER — Telehealth: Payer: BLUE CROSS/BLUE SHIELD

## 2024-07-24 NOTE — Telephone Encounter
 Call Back Request      Reason for call back: Pt calling to know if he can discuss a colonoscopy on his appt on 07/31/24 with Physical. Per pt would like both to be done the same day, but if not, he would rather discuss colonoscopy on 07/31/24.  Please confirm if both can be done.  CB#[phone]    Any Symptoms:  []  Yes  [x]  No      If yes, what symptoms are you experiencing:    Duration of symptoms (how long):    Have you taken medication for symptoms (OTC or Rx):      If call was taken outside of clinic hours:    [] Patient or caller has been notified that this message was sent outside of normal clinic hours.     [] Patient or caller has been warm transferred to the physician's answering service. If applicable, patient or caller informed to please call us  back if symptoms progress.  Patient or caller has been notified of the turnaround time of 1-2 business day(s).

## 2024-07-24 NOTE — Progress Notes
 Surgical Oncology Progress Note     PATIENT: Dustin Watts  MRN: [id number]  DOB: November 21, 1955  DATE OF SERVICE: 12/30/2021   CONSULTING ATTENDING: Oneil CHARM Ales, MD     Reason for Visit:      umbilical hernia     History of Present Illness:      Dustin Watts is a 68 y.o. male with history of cerebral palsy, epilepsy, prediabetes, and hypertension who presents for follow up of an umbilical hernia.     He was last seen in clinic on 12/30/2021 at which point he had a reducible, soft umbilical hernia present since 2021 that was only causing pain with squatting. No history of incarceration or strangulation. At that point it was recommended that he loose weight, that elective surgery could be considered when he achieves a goal of ~200 lbs, and that he should return to clinic in 6 months.     Since then ***        Past Medical History:           Past Medical History:   Diagnosis Date    Acne      Arthritis      Cerebral palsy (HCC/RAF)      Eczema      Hypertension      Impaired glucose tolerance 03/23/2016     HGBA1C of 5.7 on 03/19/2016     Low vitamin D  level 05/07/2016     05/07/2016 - taking antiepiletpic which may exacerbate it, started on ergo 50000, will recheck level in about 2 months     Overweight      Seizure (HCC/RAF) 1973     chronically on Dilantin , no seizure in 25 years, followed by Neurologist (Dr. [name]) at [facility]    Shortening of arm, congenital       right arm               Medications:           Current Outpatient Medications   Medication Instructions    ASPIRIN  LOW DOSE 81 MG EC tablet TAKE 1 TABLET BY MOUTH EVERY DAY    atorvastatin  (LIPITOR) 10 mg, Oral, Every night at bedtime    benzoyl peroxide -erythromycin  5-3% gel Apply to acne once daily.    Calcium  Carb-Cholecalciferol  (OYSTER SHELL CALCIUM  W/D) 500-200 MG-UNIT TABS No dose, route, or frequency recorded.    DILANTIN  100 MG ER capsule TAKE 4 CAPSULES ORALLY AT BEDTIME    ibuprofen  (ADVIL , MOTRIN ) 600 mg, Oral, Daily PRN RETIN-A  MICRO PUMP 0.04 % gel Topical, Every night at bedtime    sulfacetamide -sulfur  9-4.5 % LIQD liquid Apply to face twice daily    Tretinoin  Microsphere (RETIN-A  MICRO PUMP) 0.06 % GEL APPLY PEA SIZED AMOUNT TOPICALLY TO THE FACE NIGHTLY         Review of Systems:      Negative aside from what is documented in HPI.     Objective:      Physical Exam:     Vital Signs: There were no vitals taken for this visit.  GEN: NAD, WDWN, alert & appropriate. Obese body habitus.  HEENT: Sclerae anicteric, NCAT, EOMI.   CV: Regular rate.  RESP: Breathing unlabored on room air.  ABD: S/NT/ND, palpable masses or hernias. Reducible, soft umbilical hernia.   EXTR:  No clubbing, cyanosis or edema.   NEURO:  Grossly intact without any focal deficits.       Studies:  No images are attached to  the encounter.      Labs: Reviewed        Assessment:      Dustin Watts is a 68 y.o. male with history of cerebral palsy, epilepsy, prediabetes, and hypertension who presents for follow up of a reducible umbilical hernia that has never been incarcerated or strangulated. He was last seen in clinic on 12/30/2021 at which point it was recommended that he loose weight to a goal of 200 lbs before elective repair is considered. Now ***     Plan/Recommendations:      ***  - recommend weight loss (200lb goal weight)  - will consider repair of umbilical hernia after patient obtains goal weight  - will schedule followup appointment in 6 months     DWA Dr. Derinda.      Author:  Royden Laming, MD, PhD PGY-1  Department of General Surgery  Surgical Oncology (C Service)  [phone]

## 2024-07-24 NOTE — Telephone Encounter
 SWP to confirm.

## 2024-07-25 ENCOUNTER — Ambulatory Visit: Payer: Medicare Drug Benefit

## 2024-07-31 ENCOUNTER — Ambulatory Visit: Payer: BLUE CROSS/BLUE SHIELD

## 2024-07-31 DIAGNOSIS — Z23 Encounter for immunization: Secondary | ICD-10-CM

## 2024-07-31 DIAGNOSIS — G40909 Epilepsy, unspecified, not intractable, without status epilepticus: Secondary | ICD-10-CM

## 2024-07-31 DIAGNOSIS — Z9189 Other specified personal risk factors, not elsewhere classified: Secondary | ICD-10-CM

## 2024-07-31 DIAGNOSIS — E785 Hyperlipidemia, unspecified: Secondary | ICD-10-CM

## 2024-07-31 DIAGNOSIS — Z1211 Encounter for screening for malignant neoplasm of colon: Principal | ICD-10-CM

## 2024-07-31 DIAGNOSIS — R7303 Prediabetes: Secondary | ICD-10-CM

## 2024-07-31 DIAGNOSIS — M65872 Other synovitis and tenosynovitis, left ankle and foot: Secondary | ICD-10-CM

## 2024-07-31 MED ORDER — DICLOFENAC SODIUM 1 % EX GEL
2 g | Freq: Four times a day (QID) | TOPICAL | 2 refills | 13.00000 days | Status: AC | PRN
Start: 2024-07-31 — End: ?

## 2024-07-31 MED ORDER — ASPIRIN 81 MG PO TBEC
81 mg | ORAL_TABLET | Freq: Every day | ORAL | 3 refills | 42.00000 days | Status: AC
Start: 2024-07-31 — End: ?

## 2024-07-31 MED ORDER — COVID-19 MRNA VAC-TRIS(PFIZER) 30 MCG/0.3ML IM SUSY
.3 mL | Freq: Once | INTRAMUSCULAR | 0 refills | 1.00000 days | Status: AC
Start: 2024-07-31 — End: 2024-08-01

## 2024-07-31 NOTE — Progress Notes
 [facility]  Internal Medicine-Pediatrics    PATIENT: Dustin Watts  MRN: [id number]  DOB: 09-12-1956  DATE OF SERVICE: 07/31/2024  CHIEF COMPLAINT:   Chief Complaint   Patient presents with    Annual Exam        HPI   KOBEY SIDES is a 68 y.o. male who presents for annual exam.    Acute Concerns: none    Healthcare Maintenance  - Chronic Conditions:  - hx of epilepsy: On Dilatin 400 mg nightly. Last seizure was >40 years ago.  - hx of cerebral palsy  - hx of knee OA: follows with Rheumatology. Got steroid injection with Rheum. Controls pain with Tylenol , voltaren  gel, ibuprofen ,   - HTN: White-coat HTN  - HLD: not on statin therapy d/t nausea/vomiting  - hx of vitamin D  deficiency: on Calcium -Vit D supplement  - hx of acne: on benzoyl peroxide -erythromycin  5-3% daily to lesions, sulfacetamide -sulfur  solution and retin-A  at 0.06% nighttime.  - HCM: on aspirin  81 mg daily for preventative purposes, Vit A, Vit E  - Vaccines:   [x]  Covid vaccinated x4 - eligible for 2025 vaccine   []  Flu vaccinated (if applicable)   [x]  Tetanus Booster w/in last 10 years   []  Shingles completed   []  PNA vaccine completed  - Screenings:  [x]  Screened for HIV   [x]  Screened for Hep B   [x]  Screened for Hep C   []  Colon Cancer Screening (if applicable) - due   []  Breast Cancer Screening (if applicable) n/a   []  DEXA screening (if applicable) n/a   []  Other screens:  PMH     Past Medical History:   Diagnosis Date    Acne     Arthritis     Cerebral palsy (HCC/RAF)     Eczema     Hypertension     ''White coat syndrome''    Impaired glucose tolerance     Low vitamin D  level     obese     Seizure (HCC/RAF) 1973    chronically on Dilantin , no seizure in 25 years, followed by Neurologist (Dr. [name]) at [facility]    Seroma due to trauma     R foot s/p surgical removal    Shortening of arm, congenital     right arm       PSxH     Past Surgical History:   Procedure Laterality Date    rectal abscess  2010 surgery done at [facility]    Seroma Removal      s/p removal of seroma on right foot after right foot trauma       FamHx     Family History   Problem Relation Age of Onset    Heart disease Mother     Diabetes Mother     Kidney disease Mother     Stroke Brother     Colon cancer Brother 50    Prostate cancer Neg Hx     Ovarian cancer Neg Hx     Breast cancer Neg Hx     Hyperlipidemia Neg Hx     Hypertension Neg Hx     Anesthesia problems Neg Hx     Malignant hyperthermia Neg Hx     Hypotension Neg Hx     Malignant hypertension Neg Hx        SocHx     Social History     Tobacco Use    Smoking status:  Never     Passive exposure: Never    Smokeless tobacco: Never   Vaping Use    Vaping status: Never Used   Substance Use Topics    Alcohol use: No    Drug use: No       ALL     Allergies   Allergen Reactions    Topiramate      Eye swelling    Sulfa Antibiotics Angioedema     No anaphylaxis    Amlodipine  Other (See Comments)     Lip swelling without anaphylaxis    Carvedilol Angioedema     Lip swelling without anaphylaxis    Losartan Rash     No hive, no anaphylaxis, unclear if truly has rash from this medicine    Penicillins Rash     ''bumps on body''       Meds     Outpatient Medications Prior to Visit   Medication Sig    acetaminophen  325 mg tablet Take 2 tablets (650 mg total) by mouth every six (6) hours as needed (mild pain).    aspirin  (ASPIRIN  LOW DOSE) 81 mg EC tablet TAKE 1 TABLET BY MOUTH EVERY DAY    benzoyl peroxide -erythromycin  5-3% gel Apply to acne once daily.    Calcium  Carb-Cholecalciferol  500-10 MG-MCG CHEW Chew 1 tablet by mouth daily.    diclofenac  Sodium 1% gel Apply 2 g topically four (4) times daily as needed.    DILANTIN  100 MG ER capsule Take 4 capsules (400 mg total) by mouth at bedtime.    docusate 100 mg capsule Take 1 capsule (100 mg total) by mouth as needed for.    ibuprofen  400 mg tablet Take 1 tablet (400 mg total) by mouth daily as needed With food.    ibuprofen  600 mg tablet Take 1 tablet (600 mg total) by mouth daily as needed.    naproxen 500 mg tablet Take 1 tablet (500 mg total) by mouth two (2) times daily. (Patient taking differently: Take 1 tablet (500 mg total) by mouth two (2) times daily as needed.)    oxyCODONE  5 mg tablet Take 1-2 tablets (5-10 mg total) by mouth every four (4) hours as needed for Moderate Pain (Pain Scale 4-6) or Severe Pain (Pain Scale 7-10). Max Daily Amount: 60 mg    RETIN-A  MICRO PUMP 0.06 % GEL APPLY A PEA SIZED AMOUNT TOPICALLY TO AFFECTED AREA OF FACE NIGHTLY    sulfacetamide -sulfur  9-4.5 % LIQD liquid Apply to face twice daily    atorvastatin  10 mg tablet Take 1 tablet (10 mg total) by mouth at bedtime. (Patient not taking: Reported on 07/31/2024)    semaglutide  (WEGOVY ) 0.25 mg/0.5 mL SC injection pen Inject 0.5 mLs (0.25 mg total) under the skin once a week. (Patient not taking: Reported on 07/31/2024)     No facility-administered medications prior to visit.       ROS   [x]  10-point ROS reviewed: normal unless stated in HPI              Failed to redirect to the Timeline version of the REVFS SmartLink.     PHYSICAL EXAM     Last Recorded Vital Signs:    07/31/24 1009   BP: (P) 159/71   Pulse:    Resp:    Temp:    SpO2:      Body mass index is 29.43 kg/m?SABRA    LABS/STUDIES   I have:   [x]  Reviewed/ordered []  1 []  2 [x]  >=  3 unique laboratory, radiology, and/or diagnostic tests noted below    []  Reviewed []  1 []  2 []  >= 3 prior external notes and incorporated into patient assessment    []  Discussed management or test interpretation with external provider(s) as noted         A&P   CALAN DOREN is a a 68 y.o. male presenting for the following:    1. Seizure disorder (HCC/RAF)  - pt to get phenytoin  level with Neurology    2. Hyperlipidemia, unspecified hyperlipidemia type  - CBC; Future  - Comprehensive Metabolic Panel; Future  - Lipid Panel; Future    3. Prediabetes  - Hgb A1c; Future    4. At risk for coronary artery disease  - refilled:  - aspirin  (ASPIRIN  LOW DOSE) 81 mg EC tablet; Take 1 tablet (81 mg total) by mouth daily.  Dispense: 90 tablet; Refill: 3  - not interested in pharmacotherapy    5. Other synovitis and tenosynovitis, left ankle and foot  - refilled:  - diclofenac  Sodium 1% gel; Apply 2 g topically four (4) times daily as needed.  Dispense: 100 g; Refill: 2    6. Need for COVID-19 vaccine  - COVID-19 vaccine IM [employer]) age 57 years and older    7. Needs flu shot  - Influenza vaccine IM; high dose; PF (Fluzone HD) (age 67 years and older)    8. Encounter for screening colonoscopy (Primary)  - Referral for Colonoscopy Procedure - to be done at [facility]    9. Healthcare maintenance  - Reviewed/Updated histories and med list.  - pt to monitor BP at home and follow up in a few months for this  - did not want physical exam today  - Routine/Screening Labs:   - as above  - Vaccines:   - as above  - Screenings:   - as above  - RTC in 4 months for exam, medicare wellness topics and BP check     The above recommendations were discussed with the patient. The patient had all questions answered satisfactorily and is in agreement with this recommended plan of care.    Author:    Bernardino FELIX Boris Engelmann   07/31/2024   10:31 AM

## 2024-08-01 LAB — Comprehensive Metabolic Panel
ALBUMIN: 4.1 g/dL (ref 3.9–5.0)
CALCIUM: 9 mg/dL (ref 8.6–10.4)

## 2024-08-01 LAB — CBC: MEAN CORPUSCULAR HEMOGLOBIN: 32.7 pg (ref 26.4–33.4)

## 2024-08-01 LAB — Lipid Panel: CHOLESTEROL: 221 mg/dL (ref >40–<100)

## 2024-08-01 LAB — Hgb A1c: HGB A1C: 5.7 % HbA1c — ABNORMAL HIGH (ref <5.7)

## 2024-08-04 NOTE — Progress Notes
 RHEUMATOLOGY OUTPATIENT CONSULTATION  PATIENT: Dustin Watts MRN: [id number] DOB: May 19, 1956  REASONS FOR VISIT/CHIEF COMPLAINT:OA      HPI:68 y.o. male former pt of [name] with generalized OA, OK knees here to establish care.  Hx of epilepsy, pre-diabetic, HTN, HLD, vit D deficiency    Current visit patient reports the following:    08/07/2024 visit - Patient requests left knee CSI today. Interested in Physical Therapy. Concerned about left ankle swelling and pain.  Last visit injection left knee lasted for about 5 months no history of falls, pain has come    02/07/2024 visit - persistent pain in left knee, increased with frequent walking past few days. Pain improved in L knee CSI last visit. Patient requests joints injection today 02/07/2024 - L knee steroid injection     08/09/2023 visit - Persistent L knee pain, pain improved after last injection, had slight bruising following. Patient requests joints injection today 08/09/2023  L knee CSI    04/19/2023 visit - pain in L knee, Patient requests joints injection today 04/19/2023  - L knee CSI    12/30/2022 visit - overall doing well - experiencing pain in L knee at night. Patient requests joints injection and aspiration today 12/30/2022  XR last visit Bilateral knees OA left knee effusion     11/28/2022 visit - Patient has not been seen since 10/2019  [name] twice during the pandemic, the most recent 2023 / Pain in both knees, worse in L / Other joints (hands and wrists are ok)- it like it to be drained left knee  03/2022 - Foot cyst removal   Hx of seizure diagnosis, but free from seizures for long time     10/25/2019  - doing well ibuprofen  may be 3 times a week left knee bothers walking not bad ''ibuprofen  before doing a lot of walking'' no stomach issue aware not to take daily  - lost about 3 lbs doing squat daily 200s and bike    07/2019  - staying home due to covid  - L knee is not bad; occasional small pain with walking ''do I have fluid in my knee''  - Ibuprofen  800 mg; tried not to take too many; 4-5 times a month or so could be more e.g before going out/walking    03/2019  - L knee doing OK; last injection pain after the procedure lasted 1.5 month; now fine; walking fine  - L ankle strain earlier this year not recall splint  - Ibuprofen  800 mg prn BID but not need them; taking oxycodone  as needed on average once at night for the chronic LBP    09/2018  - overall good no falls; L knee some swelling and worsening pain wondering if take fluid out or if any fluid inside  - ibuprofen  as needed 800 mg 2 times a day only prn for years no problem with food no indigestion  - otherwise doing well    11/2017   -last visit increasing knee pain XR R knee good L knee mild OA   - doing well; no seizure any longer  - joints are not bad; no pain with walking; no need to take ibuprofen  regularly; takes infrequently- a day before and on day out e[place]g going [place]; requesting refill; no problem no GERD/taking with food    08/2017  -last visit 02/2017 joints are doing fine on prn NSAID/post CVA  -knees are doing not too badly ''only after long walk'' ''described as blown up'',  keen to have refill ibuprofen  800 mg PRN given by [name] in past ''I only take it if I have a long walk to do so taking infrequently''; no stiffness; at times puffy not today; slight worse lately with pain   -other joints are fine  -chronic R sided atrophy since borne Dx cerebral palsy otherwise in good health  -no seizure for some time now    No known history of malar rash, photosensitivity rash, discoid lesions, oral/nasal ulcers,  pleuritic chest pain, seizures/psychosis, Raynaud's, skin tightness, dry eyes/mouth, hair loss or blood clots.  No history of muscle pain or proximal myopathy.  No history for fever, weight loss, loss of appetite, night sweat or lymphadenopathy.  No history of skin psoriasis, eye issues, STDs or IBD.    No recent travelling.  No sick contacts.    Prior medication/Joint injections/Immune suppressant therapy:  None    PMHx:  Past Medical History:   Diagnosis Date    Acne     Arthritis     Cerebral palsy (HCC/RAF)     Eczema     Hypertension     ''White coat syndrome''    Impaired glucose tolerance     Low vitamin D  level     obese     Seizure (HCC/RAF) 1973    chronically on Dilantin , no seizure in 25 years, followed by Neurologist (Dr. [name]) at [facility]    Seroma due to trauma     R foot s/p surgical removal    Shortening of arm, congenital     right arm   pre-diabetic, HTN, HLD  Hx vit D deficiency WNL 02/2017  Overweight BMI 04/19/2023- 31    PSHx:  Past Surgical History:   Procedure Laterality Date    rectal abscess  2010    surgery done at [facility]    Seroma Removal      s/p removal of seroma on right foot after right foot trauma       Meds:    Outpatient Medications Prior to Visit   Medication Sig    acetaminophen  325 mg tablet Take 2 tablets (650 mg total) by mouth every six (6) hours as needed (mild pain).    aspirin  (ASPIRIN  LOW DOSE) 81 mg EC tablet Take 1 tablet (81 mg total) by mouth daily.    benzoyl peroxide -erythromycin  5-3% gel Apply to acne once daily.    Calcium  Carb-Cholecalciferol  500-10 MG-MCG CHEW Chew 1 tablet by mouth daily.    diclofenac  Sodium 1% gel Apply 2 g topically four (4) times daily as needed.    DILANTIN  100 MG ER capsule Take 4 capsules (400 mg total) by mouth at bedtime.    docusate 100 mg capsule Take 1 capsule (100 mg total) by mouth as needed for.    ibuprofen  600 mg tablet Take 1 tablet (600 mg total) by mouth daily as needed.    RETIN-A  MICRO PUMP 0.06 % GEL APPLY A PEA SIZED AMOUNT TOPICALLY TO AFFECTED AREA OF FACE NIGHTLY    sulfacetamide -sulfur  9-4.5 % LIQD liquid Apply to face twice daily     No facility-administered medications prior to visit.         SocHx:  No T/E/D use  Works in Loss adjuster, chartered; single    FamHx:  There is no family history of autoimmune/rheumatic disease and psoriasis.    Physical Exam:    08/07/2024  BP 133/79  ~ Pulse 74  ~ Temp 36.4 ?C (97.6 ?  F) (Forehead)  ~ Resp 16  ~ Ht 6' (1.829 m)  ~ Wt 217 lb (98.4 kg)  ~ SpO2 97%  ~ BMI 29.43 kg/m?     Pain Information (Last Filed)       Score Location Comments Edu?      7 Knee - left leg None None              NAD  No ocular injection  No alopecia  OP clear  No LAD  CTAB  RRR  S/NT/ND  No C/C/E  No rashes  No telangectasia/discoloration/calcinosis/sclerodactyly  No tender joints  No swollen joints  L knee crepitus+, small effusion, no warmth   No enthesitis  No dactylitis  No myofascial pain  Neuro grossly intact except R extremities atrophy  Peripheral pulses intact      Labs:  Lab Results   Component Value Date    WBC 4.56 07/31/2024    HGB 13.6 07/31/2024    HCT 41.0 07/31/2024    PLT 172 07/31/2024    NEUTABS 2.97 10/25/2023    LYMPHABS 2.04 10/25/2023     Lab Results   Component Value Date    GLUCOSE 101 (H) 07/31/2024    GFREST >89 04/18/2014    CREAT 0.55 (L) 07/31/2024    CALCIUM  9.0 07/31/2024    TOTPRO 7.4 07/31/2024    ALBUMIN 4.1 07/31/2024    AST 27 07/31/2024    ALT 29 07/31/2024    ALKPHOS 84 07/31/2024     @RRCOAGS @  Lab Results   Component Value Date    TSH 1.5 01/21/2017       Rheum labs:  Lab Results   Component Value Date    SRWEST 13 (H) 05/27/2017    SRWEST 15 (H) 11/28/2014     No results found for: ''CARDLIPIGA'', ''CARDLIPIGG'', ''CARDLIPIGM'', ''ANAAB'', ''ANAABTITER'', ''B2GLYPROIGA'', ''B2GLYPROIGG'', ''B2GLYPROIGM'', ''DRVVT'', ''RNPAB'', ''SMAB'', ''SSA''  No results found for: ''C3'', ''C4'', ''DSDNAAB'', ''NDNAABIFA''  No results found for: ''CANCA'', ''PANCA'', ''PROT3AB'', ''MPOAB''  Lab Results   Component Value Date    CKTOT 68 04/23/2008    URICACID 3.6 11/28/2014     Lab Results   Component Value Date    VITD25OH 35 10/09/2022    VITD25OH 37 09/30/2021     UA:   Lab Results   Component Value Date    PROTCLUR Negative 03/31/2024    BLDUR Negative 05/15/2024    LEUKESTUR Negative 03/31/2024    RBCSUR 3 03/31/2024    WBCSUR Negative 05/15/2024       Studies: [x] reviewed [] ordered [] independently reviewed.  Component      Latest Ref Rng & Units 09/16/2018   Segmented Neutrophil      No Reference Range % 1   Lymphocyte      No Reference Range % 1   Monocyte      No Reference Range % 98   Total Cells      cells 100   Fluid Appearance       Viscoid . . .   RBC Count,Joint Fl      No Reference Range /cmm <1,000   WBC Count,Joint Fl      No Reference Range /cmm 282   Crystals,Fluid #1      No Crystals Seen No Crystals Seen       Imaging:    XR Knees B/L 11/2022:   R- IMPRESSION: The previously noted material was extrinsic about the right knee. There is mild right and moderate left knee  osteoarthritis.  L - IMPRESSION:  1. Limited examination due to overlying material. Rectangular density projecting over the suprapatellar recess on the lateral view of the right knee may be external. The patient will be called back to the department for repeat frontal and lateral views   of the right knee.  2. There is progression of left knee osteoarthritis, now moderate.     Knees XR 08/2017   Mild osteoarthritis of the left knee.  No significant arthritis of the right knee.    Knees 2007 normal     ASSESSMENT AND PLAN  RANIER COACH is a 68 y.o. male     # Knees OA stable on prn ibuprofen  800 mg as needed; no GERD; pt takes rarely; recent slight worsening with effusion in L knee s/p CSI 2019- synovial fluid no crystals    12/30/2022 - pain in L knee at night and feels there is fluid - aspiration and CSI of L knee today XR Knees 11/2022 shows mild osteoarthritis of R knee and moderate osteoarthritis of L knee  5 cc fluid obtained no crystals non-inflammatory    02/07/2024 L knee CSI vital  08/07/2024 Left knee CSI today. Will start Physical Therapy at [facility].    # Injection history left knee  L knee CSI  04/19/2023, 08/09/2023, 02/07/2024, 08/07/24  L knee aspiration and CSI 12/2022   Bilateral CSI 09/2018    # Left Ankle Pain and Swelling ? OA  08/07/2024 Will complete XR Left Ankle to evaluate pain and swelling.    # Hx of epilepsy stable     RECOMMENDATIONS  Left knee steroid injection today (08/07/24)      Please ice injection site few times today and tomorrow, about 10-15 minutes. Please do not overuse the injected joint/area for 48 hours after injection. Please call me for any signs of infection, such as fever, chills, local warmth, redness, and/or pain.  Please keep your appointments. Thank you!    Complete XR Left Ankle to evaluate your swelling and pain. order placed today 08/07/2024     Referral to [facility] Physical Therapy for knee Ostearthritis sent today (08/07/24).    Follow up in 4-6 weeks to discuss XR Left Ankle results.    Weight reduction  Keep minimum Ibuprofen  400 mg qday PRN-with food    Orders Placed This Encounter    Inject/Drain Knee    XR ankle ap+lat left (2 views)    Referral to [facility], Physical Therapy    lidocaine  PF 1% inj 2 mL    triamcinolone  acetonide 40 mg/mL inj 40 mg        RTC4-6 weeks or sooner as needed.  CC note to PCP/referred physician    Problem is:  [x]  Chronic unstable  []  Chronic stable   []  New minor/self-limited problem  []  Acute uncomplicated illness/injury   []  Acute systemic illness or complicated injury   []  New problem with uncertain diagnosis  []  New problem that poses threat to life or bodily function    RISK OF COMPLICATION   This writer has deemed the above diagnoses to have a risk of complication, morbidity or mortality of:   []  Minimal  [x]  Low  []  Moderate  []  Severe     BILLING BY TIME   I spent  minutes for the items checked below on the day of service >50% spent in face to face evaluation and counseling the patient.  The above plan of care, diagnosis, orders, and follow-up were discussed  with the patient. Questions related to this recommended plan of care were answered    [x]  Preparing to see the patient (e.g., review of tests) Reviewed and summarized old records.   [x]  Performing a medically appropriate examination and/or evaluation   [x]  Independently interpreting results and communicating results to patient/ family/caregiver  [x]  Counseling and educating the patient/family/caregiver   [x]  Ordering medications, tests, or procedures  [x]  Risks/benefits/toxicities of meds discussed.   [x]  Referring and communicating with other healthcare professionals (when not separately reported) -Discussed with other providers of complex medical conditions and management plans outlined above  [x]  Documenting clinical information in the EHR  []  Immunosuppression drugs changed.   []  Requested outside medical records.    Dayton Harrison MD Rheumatology Division, [place] [phone])    Author:  Dayton Harrison, M.D. [facility]  08/07/2024 10:36 AM    [facility]  [address]   [place], [place] [address]  [phone]  Fax [phone]      ATTESTATION     Scribe Signature:  LILLETTE Curtistine Cola, have scribed for Dayton Harrison, MD, MS on this encounter with KAZ AULD on 08/07/2024 at 10:36 AM.     Physician Signature:  I have reviewed this note on this encounter with Dorn GORMAN Code on 08/07/2024 at 10:36 AM, scribed by Curtistine Cola. I have edited and attest that it is an accurate representation of my evaluation.    Dayton Harrison, MD, MS at 10:36 AM.

## 2024-08-07 ENCOUNTER — Ambulatory Visit: Payer: Medicaid HMO

## 2024-08-07 ENCOUNTER — Inpatient Hospital Stay: Payer: MEDICAID

## 2024-08-07 ENCOUNTER — Ambulatory Visit: Payer: MEDICAID

## 2024-08-07 DIAGNOSIS — G8929 Other chronic pain: Secondary | ICD-10-CM

## 2024-08-07 DIAGNOSIS — M1712 Unilateral primary osteoarthritis, left knee: Principal | ICD-10-CM

## 2024-08-07 DIAGNOSIS — M25572 Pain in left ankle and joints of left foot: Secondary | ICD-10-CM

## 2024-08-07 MED ADMIN — LIDOCAINE HCL (PF) 1 % IJ SOLN: 2 mL | INTRA_ARTICULAR | @ 17:00:00 | Stop: 2024-08-07 | NDC 00143959525

## 2024-08-07 MED ADMIN — TRIAMCINOLONE ACETONIDE 40 MG/ML IJ SUSP: 40 mg | INTRA_ARTICULAR | @ 17:00:00 | Stop: 2024-08-07 | NDC 00703024101

## 2024-08-07 NOTE — Procedures
 Procedure Note: left knee steroid injection  Physician performing injection: Dayton Harrison, MD, Rheumatology      The risks, benefits, and alternatives of the procedure were explained, and verbal consent was obtained.  The risks and benefits of the injection were discussed with the patient including but not limited to the risk of allergic reactions, infection, flare reaction, damage to local nerves, tendons or vessels, skin color changes (usually hyperpigmentation), local fat atrophy, the risk of blood sugar elevations and a hyperactivity response. The patient understands these risks elected to proceed.    Area over medial aspect of left knee was prepped with betadyne and numbed with 1% xylocaine . 22-gauge needle was inserted, and Kenalog  40mg  + 4cc 1% xylocaine  were injected. Patient tolerated procedure well without any immediate complications. Patient was instructed to monitor for s/s of reaction to steroid and infection.  ED precaution discussed.  Ice applied immediately at site of injection.

## 2024-08-07 NOTE — Patient Instructions
 Left knee steroid injection today (08/07/24)      Please ice injection site few times today and tomorrow, about 10-15 minutes. Please do not overuse the injected joint/area for 48 hours after injection. Please call me for any signs of infection, such as fever, chills, local warmth, redness, and/or pain.  Please keep your appointments. Thank you!    Complete XR Left Ankle to evaluate your swelling and pain. order placed today 08/07/2024     Referral to [facility] Physical Therapy for knee Ostearthritis sent today (08/07/24).    Follow up in 4-6 weeks to discuss XR Left Ankle results.

## 2024-08-18 ENCOUNTER — Telehealth: Payer: Medicaid HMO

## 2024-08-18 NOTE — Telephone Encounter
 Faxed colonoscopy referral and order to [facility] at F: [phone].

## 2024-08-18 NOTE — Telephone Encounter
 Message to Practice/Provider      Message: pt is requesting Referral for Colonoscopy Procedure fax to [facility]. Pt spoke with them and they haven't receive the referral.   fax: [phone]  phone: [phone]    Also, pt wants to make sure that on the referral it has that prep will be done in the hospital. Pt stated it was discussed with Dr. Cline last office visit. And Dr Cline is aware of the reason (seizures).  Return call is not being requested by the patient or caller.    Patient or caller has been notified that their message will be reviewed within 1-2 business days.

## 2024-08-22 NOTE — Telephone Encounter
 Call Back Request      Reason for call back: Pt. Needs the order for colonoscopy to be refaxed. Please advise, thank you    Any Symptoms:  []  Yes  [x]  No      If yes, what symptoms are you experiencing:    Duration of symptoms (how long):    Have you taken medication for symptoms (OTC or Rx):      If call was taken outside of clinic hours:    [] Patient or caller has been notified that this message was sent outside of normal clinic hours.     [] Patient or caller has been warm transferred to the physician's answering service. If applicable, patient or caller informed to please call us  back if symptoms progress.  Patient or caller has been notified of the turnaround time of 1-2 business day(s).

## 2024-08-22 NOTE — Telephone Encounter
 Called [facility] to confirm fax number, original fax number provided was incorrect. Correct fax number is [phone].     Faxed orders to [facility] at F: [phone].

## 2024-08-25 NOTE — Telephone Encounter
 Message to Practice/Provider      Message:   Can you please send referral to patient? [facility] today and they said they don't have it  feel free to give him a call, also make sure to state is an overnight stay.  Return call is not being requested by the patient or caller.    Patient or caller has been notified that their message will be reviewed within 1-2 business days.

## 2024-08-25 NOTE — Telephone Encounter
 Please assist with referral. Thanks!

## 2024-08-28 ENCOUNTER — Ambulatory Visit: Payer: BLUE CROSS/BLUE SHIELD

## 2024-08-28 DIAGNOSIS — D649 Anemia, unspecified: Principal | ICD-10-CM

## 2024-08-28 NOTE — Progress Notes
 Patient name:  Dustin Watts  MRN:  [id number]  DOB:  12-25-1955  Date of encounter: 08/28/2024      History of Present Illness  68 yo with hx of CP and seizures, following with neurology. He was found to have hgb 13.2 on routine labs with PCP and referred for consultation.     Hgb 13.2 on 07/26/23, MCV 98.7. Ferritin 92. Folate 35.7. Vita B12 2957. Cr 0.58.     CBC's in [facility] going back many yrs reveal fluctuating RBC/Hb, but stable over time. Normal plts and WBC/ANC.    ROS: he feels well. Good energy and appetite. Stable AED dose; no seizures in >30-40 yrs.    Feb 2025: remains well.    *Interim: stable, well. To have colonoscopy at [facility] (close to home) soon.    Outpatient Medications Prior to Visit   Medication Sig    acetaminophen  325 mg tablet Take 2 tablets (650 mg total) by mouth every six (6) hours as needed (mild pain).    aspirin  (ASPIRIN  LOW DOSE) 81 mg EC tablet Take 1 tablet (81 mg total) by mouth daily.    benzoyl peroxide -erythromycin  5-3% gel Apply to acne once daily.    Calcium  Carb-Cholecalciferol  500-10 MG-MCG CHEW Chew 1 tablet by mouth daily.    diclofenac  Sodium 1% gel Apply 2 g topically four (4) times daily as needed.    DILANTIN  100 MG ER capsule Take 4 capsules (400 mg total) by mouth at bedtime.    docusate 100 mg capsule Take 1 capsule (100 mg total) by mouth as needed for.    ibuprofen  600 mg tablet Take 1 tablet (600 mg total) by mouth daily as needed.    RETIN-A  MICRO PUMP 0.06 % GEL APPLY A PEA SIZED AMOUNT TOPICALLY TO AFFECTED AREA OF FACE NIGHTLY    sulfacetamide -sulfur  9-4.5 % LIQD liquid Apply to face twice daily     No facility-administered medications prior to visit.       Allergies   Allergen Reactions    Topiramate      Eye swelling    Sulfa Antibiotics Angioedema     No anaphylaxis    Amlodipine  Other (See Comments)     Lip swelling without anaphylaxis    Carvedilol Angioedema     Lip swelling without anaphylaxis    Losartan Rash     No hive, no anaphylaxis, unclear if truly has rash from this medicine    Penicillins Rash     ''bumps on body''     Past Medical History:   Diagnosis Date    Acne     Arthritis     Cerebral palsy (HCC/RAF)     Eczema     Hypertension     ''White coat syndrome''    Impaired glucose tolerance     Low vitamin D  level     obese     Seizure (HCC/RAF) 1973    chronically on Dilantin , no seizure in 25 years, followed by Neurologist (Dr. [name]) at [facility]    Seroma due to trauma     R foot s/p surgical removal    Shortening of arm, congenital     right arm     Past Surgical History:   Procedure Laterality Date    rectal abscess  2010    surgery done at [facility]    Seroma Removal      s/p removal of seroma on right foot after right foot trauma  Social History     Tobacco Use    Smoking status: Never     Passive exposure: Never    Smokeless tobacco: Never   Substance Use Topics    Alcohol use: No     Family History   Problem Relation Age of Onset    Heart disease Mother     Diabetes Mother     Kidney disease Mother     Stroke Brother     Colon cancer Brother 50    Prostate cancer Neg Hx     Ovarian cancer Neg Hx     Breast cancer Neg Hx     Hyperlipidemia Neg Hx     Hypertension Neg Hx     Anesthesia problems Neg Hx     Malignant hyperthermia Neg Hx     Hypotension Neg Hx     Malignant hypertension Neg Hx        Objective  Last Recorded Vital Signs:    08/28/24 0921   BP: 158/90   Pulse: 79   Resp: 17   Temp: 36.2 ?C (97.1 ?F)   SpO2: 96%   No distress  Anicteric  Ext WWP      Hemoglobin   Date Value Ref Range Status   07/31/2024 13.6 13.5 - 17.1 g/dL Final   90/83/7975 86.7 (L) 13.5 - 17.1 g/dL Final     Hemoglobin (LabDAQ)   Date Value Ref Range Status   10/25/2023 14.3 13.7 - 17.5 g/dL Final     Hematocrit   Date Value Ref Range Status   07/31/2024 41.0 38.5 - 52.0 % Final   07/26/2023 38.9 38.5 - 52.0 % Final     Hematocrit (LabDAQ)   Date Value Ref Range Status   10/25/2023 43.0 40.1 - 51.0 % Final     White Blood Cell Count   Date Value Ref Range Status   07/31/2024 4.56 4.16 - 9.95 x10E3/uL Final   07/26/2023 4.86 4.16 - 9.95 x10E3/uL Final     WBC (LabDAQ)   Date Value Ref Range Status   10/25/2023 5.54 3.98 - 10.04 10??/uL Final     Platelet Count, Auto   Date Value Ref Range Status   07/31/2024 172 143 - 398 x10E3/uL Final   07/26/2023 184 143 - 398 x10E3/uL Final     Platelets (LabDAQ)   Date Value Ref Range Status   10/25/2023 178 163 - 369 10??/uL Final     Mean Corpuscular Hemoglobin   Date Value Ref Range Status   07/31/2024 32.7 26.4 - 33.4 pg Final   07/26/2023 33.5 (H) 26.4 - 33.4 pg Final     MCH (LabDAQ)   Date Value Ref Range Status   10/25/2023 32.9 (H) 25.6 - 32.2 pg Final     Mean Corpuscular Volume   Date Value Ref Range Status   07/31/2024 98.6 79.3 - 98.6 fL Final   07/26/2023 98.7 (H) 79.3 - 98.6 fL Final     MCV (LabDAQ)   Date Value Ref Range Status   10/25/2023 98.9 (H) 79.0 - 94.8 fL Final      *Blood smear: uniform RBC's; scattered granular lymphs, otherwise nl WBC morphology; adequate plts.      Impression and Recommendations:  NAQUAN GARMAN is a 68 y.o. male with intermittent, modest anemia, with nl WBC/ANC and plts; this may be constitutional (a normal variant = his baseline). Long term AED use, and high-nl MCV. He is asymptomatic.    - cont to observe,  with CBC q6-12 months, or prn  - to have screening colonoscopy (last >10 yrs ago) soon  - RV 1 yr.      Glyndon Tursi S. Amy Belloso 08/28/2024 10:23 AM

## 2024-08-28 NOTE — Telephone Encounter
 Referral has been faxed out to [facility] again and referral will be mailed to patient has rhona is not set up

## 2024-09-05 NOTE — Progress Notes
 INITIAL ALLERGY/IMMUNOLOGY OUTPATIENT VISIT    Patient name:  Dustin Watts  MRN:  [id number]  DOB:  02-17-1956  Date of service:  09/06/2024    Referring provider: Regina Brad PARAS., MD  PCP: Cline Bernardino LABOR., MD    Reason for Visit: adverse drug reaction to Azo     HPI   Dustin Watts is a 68 y.o. male presenting for initial evaluation of adverse drug reaction.    Referred for concern for reaction to Azo. He does not recall the details of the reaction. Over the summer 2025, was seen in the ER/[facility] multiple times for concern for UTI. Was on multiple antibiotics for this. Took Azo and Aspirin  at the same time one night. Per triage nursing documentation on 04/10/24, presented with chief complaint of dysuria and dry lip/mouth after taking Azo. Denies history of rash/hives, wheezing, angioedema with episode. He is inquiring about blood testing for medication allergies. Takes multiple supplements but denies history of reaction to these.    ADDITIONAL ATOPIC HISTORY:  Allergic rhinitis: No  Asthma: No  Atopic dermatitis: No  Food allergy: No  Adverse reaction to stinging insect (vespid): No  Urticaria: No  Angioedema: No  Adverse reactions to medications: Yes      Review Of Systems: 14 system review performed; all systems negative, except as documented above.    SOCHX     Social History     Tobacco Use    Smoking status: Never     Passive exposure: Never    Smokeless tobacco: Never   Substance Use Topics    Alcohol use: No     Social History     Social History Narrative    Not on file       [facility]     Family History   Problem Relation Age of Onset    Heart disease Mother     Diabetes Mother     Kidney disease Mother     Stroke Brother     Colon cancer Brother 50    Prostate cancer Neg Hx     Ovarian cancer Neg Hx     Breast cancer Neg Hx     Hyperlipidemia Neg Hx     Hypertension Neg Hx     Anesthesia problems Neg Hx     Malignant hyperthermia Neg Hx     Hypotension Neg Hx     Malignant hypertension Neg Hx        IMM Immunization History   Administered Date(s) Administered    COVID-19 [employer] and up) PF, 30 mcg/0.3 mL 11/15/2023, 07/31/2024    COVID-19 [employer] Fall 2023 12y and up) PF, 30 mcg/0.3 mL 03/30/2023    COVID-19, mRNA, (Pfizer - Purple Cap) 30 mcg/0.3 mL 08/23/2020, 09/20/2020    Tdap 03/08/2016    influenza vaccine IM quadrivalent (Fluarix Quad) (PF) SYR (75 months of age and older) 08/04/2017, 09/08/2018, 08/23/2020, 08/05/2021, 08/07/2022    influenza vaccine IM quadrivalent (Fluzone Quad) (PF) SYR/SDV (65 months of age and older) 08/06/2016    influenza vaccine IM trivalent high dose (Fluzone High Dose) (PF) SYR (43 years of age and older) 11/15/2023, 07/31/2024    influenza, unspecified formulation 08/06/2016, 08/04/2017    zoster vac recomb adjuvanted (Shingrix ) 02/04/2024        ALL     Allergies   Allergen Reactions    Topiramate      Eye swelling    Sulfa Antibiotics Angioedema     No anaphylaxis  Amlodipine  Other (See Comments)     Lip swelling without anaphylaxis    Carvedilol Angioedema     Lip swelling without anaphylaxis    Losartan Rash     No hive, no anaphylaxis, unclear if truly has rash from this medicine    Penicillins Rash     ''bumps on body''       MEDS     No outpatient medications have been marked as taking for the 09/06/24 encounter (Office Visit) with Adron Ramey SAILOR., MD.       PHYSICAL EXAM    BP 151/93  ~ Pulse 83  ~ Temp 36.5 ?C (97.7 ?F)  ~ Wt 219 lb (99.3 kg)  ~ SpO2 97%  ~ BMI 29.70 kg/m? ; Body mass index is 29.7 kg/m?SABRA  Physical exam:  General: Alert and oriented, well nourished, no acute distress  HEENT: NC/AT, normal conjunctiva    OP: moist oral mucosa  Lungs: non-labored respirations  Skin: Skin is warm and dry, no rashes or lesions  Neurologic: Awake, alert, and oriented x3  Psychiatric: Cooperative, appropriate mood and affect    LABS/DIAGNOSTICS   I personally and independently reviewed the following labs/imaging/pathology:    Lab Results   Component Value Date    WBC 4.56 07/31/2024    HGB 13.6 07/31/2024    HCT 41.0 07/31/2024    MCV 98.6 07/31/2024    PLT 172 07/31/2024     Absolute Eos Count   Date Value Ref Range Status   03/20/2022 0.08 0.00 - 0.50 x10E3/uL Final   02/14/2009 0.1 0.0 - 0.6 x10E3/uL      Eosinophil # (LabDAQ)   Date Value Ref Range Status   10/25/2023 0.13 0.04 - 0.54 10??/uL Final     Lab Results   Component Value Date    ALT 29 07/31/2024    AST 27 07/31/2024    ALKPHOS 84 07/31/2024    BILITOT 0.3 07/31/2024     No results found for: ''IGGSER'', ''IGMSER'', ''IGASER'', ''IGE''  No results found for: ''TRYPTASE''  Lab Results   Component Value Date    TSH 1.5 01/21/2017     Sedimentation Rate, Westergren   Date Value Ref Range Status   04/23/2008 9 0 - 10 mm/hr      Comment:     Sample is greater than 4 hours old.  Interpret with caution     Sedimentation rate, Erythrocyte   Date Value Ref Range Status   05/27/2017 13 (H) <=12 mm/hr Final       Results for orders placed or performed in visit on 07/31/24   Comprehensive Metabolic Panel   Result Value Ref Range    Sodium 140 135 - 146 mmol/L    Potassium 4.0 3.6 - 5.3 mmol/L    Chloride 105 96 - 106 mmol/L    Total CO2 23 20 - 30 mmol/L    Anion Gap 12 8 - 19 mmol/L    Glucose 101 (H) 65 - 99 mg/dL    Creatinine 9.44 (L) 0.60 - 1.30 mg/dL    Estimated GFR >10 See GFR Additional Information mL/min/1.44m2    GFR Additional Information See Comment     Urea Nitrogen 8 7 - 22 mg/dL    Calcium  9.0 8.6 - 10.4 mg/dL    Total Protein 7.4 6.1 - 8.2 g/dL    Albumin 4.1 3.9 - 5.0 g/dL    Bilirubin,Total 0.3 0.1 - 1.2 mg/dL    Alkaline Phosphatase 84 37 - 133  U/L    Aspartate Aminotransferase 27 13 - 62 U/L    Alanine Aminotransferase 29 8 - 70 U/L                      I personally and independently reviewed family medicine office visit note from [facility] 04/11/24.     ALLERGY TEST          No data to display                   No data to display                ASSESSMENT   Dustin Watts is a 68 y.o. male who presents with for initial evaluation of adverse drug reaction.    1. Adverse drug reaction    Per chart review, history of dysuria and dry lips/mouth after taking Azo and Aspirin  in May 2025. Patient does not recall specific details but reports he presented to the ER given concern for reaction. Denies associated rash, wheezing. Symptoms of dry mouth and dysuria are not consistent with hypersensitivity reaction. Discussed he can take Azo in the future if he needs to. He inquired about broad blood testing for medication allergies, which unfortunately is not available. Advised that he can contact us  if he has concern for medication allergy in the future.       Return if symptoms worsen or fail to improve. or sooner with any questions or concerns. Please call with any questions.     Patient is in agreement with plan of care.  I have answered all questions satisfactorily.    Thank you Regina Brad PARAS., MD for your consultation, my recommendations are above. Please call with any questions.     No orders of the defined types were placed in this encounter.       Coordination of care:   45 minutes were spent personally by me today on this encounter which include today's pre-visit review of the chart, obtaining appropriate history, performing an evaluation, documentation and discussion of management with details supported within the note for today's visit. The time documented was exclusive of any time spent on the separately billed procedure.        Ramey Coward, MD  Allergy & Immunology skin test [id number]}      Ramey Coward, MD  Allergy & Immunology

## 2024-09-06 ENCOUNTER — Ambulatory Visit: Payer: Medicaid HMO

## 2024-09-06 DIAGNOSIS — T50905A Adverse effect of unspecified drugs, medicaments and biological substances, initial encounter: Principal | ICD-10-CM

## 2024-09-06 NOTE — Patient Instructions
 Allergy & Immunology Post-Clinic Visit Instructions:  Thank you for visiting our clinic!     Reaction with Azo included dry mouth and burning with urination; symptoms are not suggestive of allergy. If you do required this again, you may take it.    Please contact me with any questions    [facility] Allergy & Immunology  [address]  [place], [place] [address]  Phone: [phone]

## 2024-09-25 ENCOUNTER — Ambulatory Visit: Payer: MEDICAID

## 2024-09-25 ENCOUNTER — Ambulatory Visit: Payer: MEDICARE | Attending: Acute Care

## 2024-09-26 ENCOUNTER — Ambulatory Visit: Payer: BLUE CROSS/BLUE SHIELD

## 2024-10-09 NOTE — Patient Instructions
 Discussion: History of fall a few weeks ago. Patient complaining of tingling overlap knee. [facility] ED note reviewed: XR hands and left ankle no fracture. Recommendation do XR Bilateral Knees today for evaluation for injury (bilateral knee pain) ordered today (10/10/24).    Per patient request, Durable Medical Equipment order for soft ankle brace placed today (10/10/24).    Reminder: Referral to [facility] for knee Ostearthritis ordered 08/07/24.    Follow up in person in 3 months (40 min) for knee injection.

## 2024-10-09 NOTE — Progress Notes
 RHEUMATOLOGY OUTPATIENT CONSULTATION  PATIENT: Dustin Watts MRN: [id number] DOB: 1956-09-24  REASONS FOR VISIT/CHIEF COMPLAINT:OA      HPI:68 y.o. male former pt of Dustin Watts with generalized OA, OK knees here to establish care.  Hx of epilepsy, pre-diabetic, HTN, HLD, vit D deficiency    Current visit patient reports the following:    10/10/2024 visit - Interim last visit left ankle XR no inflammatory arthritis. Had fall 08/2024, slipped from chair. Tingling in left knee since fall. 08/17/24 [facility] ED note per Care Everywhere: XR Bilateral Hands and XR Left Ankle showed no evidence of fracture or dislocation. No XR of knees done.  Patient request DME ankle sprain brace previously primary care doctor placed the order back in 2020    08/07/2024 visit - Patient requests left knee CSI today. Interested in Physical Therapy. Concerned about left ankle swelling and pain.  Last visit injection left knee lasted for about 5 months no history of falls, pain has come    02/07/2024 visit - persistent pain in left knee, increased with frequent walking past few days. Pain improved in L knee CSI last visit. Patient requests joints injection today 02/07/2024 - L knee steroid injection     08/09/2023 visit - Persistent L knee pain, pain improved after last injection, had slight bruising following. Patient requests joints injection today 08/09/2023  L knee CSI    04/19/2023 visit - pain in L knee, Patient requests joints injection today 04/19/2023  - L knee CSI    12/30/2022 visit - overall doing well - experiencing pain in L knee at night. Patient requests joints injection and aspiration today 12/30/2022  XR last visit Bilateral knees OA left knee effusion     11/28/2022 visit - Patient has not been seen since 10/2019  [name] twice during the pandemic, the most recent 2023 / Pain in both knees, worse in L / Other joints (hands and wrists are ok)- it like it to be drained left knee  03/2022 - Foot cyst removal   Hx of seizure diagnosis, but free from seizures for long time     10/25/2019  - doing well ibuprofen  may be 3 times a week left knee bothers walking not bad ''ibuprofen  before doing a lot of walking'' no stomach issue aware not to take daily  - lost about 3 lbs doing squat daily 200s and bike    07/2019  - staying home due to covid  - L knee is not bad; occasional small pain with walking ''do I have fluid in my knee''  - Ibuprofen  800 mg; tried not to take too many; 4-5 times a month or so could be more e.g before going out/walking    03/2019  - L knee doing OK; last injection pain after the procedure lasted 1.5 month; now fine; walking fine  - L ankle strain earlier this year not recall splint  - Ibuprofen  800 mg prn BID but not need them; taking oxycodone  as needed on average once at night for the chronic LBP    09/2018  - overall good no falls; L knee some swelling and worsening pain wondering if take fluid out or if any fluid inside  - ibuprofen  as needed 800 mg 2 times a day only prn for years no problem with food no indigestion  - otherwise doing well    11/2017   -last visit increasing knee pain XR R knee good L knee mild OA   - doing well; no seizure  any longer  - joints are not bad; no pain with walking; no need to take ibuprofen  regularly; takes infrequently- a day before and on day out e[place]g going [place]; requesting refill; no problem no GERD/taking with food    08/2017  -last visit 02/2017 joints are doing fine on prn NSAID/post CVA  -knees are doing not too badly ''only after long walk'' ''described as blown up'', keen to have refill ibuprofen  800 mg PRN given by Dustin Watts in past ''I only take it if I have a long walk to do so taking infrequently''; no stiffness; at times puffy not today; slight worse lately with pain   -other joints are fine  -chronic R sided atrophy since borne Dx cerebral palsy otherwise in good health  -no seizure for some time now    No known history of malar rash, photosensitivity rash, discoid lesions, oral/nasal ulcers,  pleuritic chest pain, seizures/psychosis, Raynaud's, skin tightness, dry eyes/mouth, hair loss or blood clots.  No history of muscle pain or proximal myopathy.  No history for fever, weight loss, loss of appetite, night sweat or lymphadenopathy.  No history of skin psoriasis, eye issues, STDs or IBD.    No recent travelling.  No sick contacts.    Prior medication/Joint injections/Immune suppressant therapy:  None    PMHx:  Past Medical History:   Diagnosis Date    Acne     Arthritis     Cerebral palsy (HCC/RAF)     Eczema     Hypertension     ''White coat syndrome''    Impaired glucose tolerance     Low vitamin D  level     obese     Seizure (HCC/RAF) 1973    chronically on Dilantin , no seizure in 25 years, followed by Neurologist (Dustin. Harlene Watts) at [facility]    Seroma due to trauma     R foot s/p surgical removal    Shortening of arm, congenital     right arm   pre-diabetic, HTN, HLD  Hx vit D deficiency WNL 02/2017  Overweight BMI 04/19/2023- 31    PSHx:  Past Surgical History:   Procedure Laterality Date    rectal abscess  2010    surgery done at [facility]    Seroma Removal      s/p removal of seroma on right foot after right foot trauma       Meds:    Outpatient Medications Prior to Visit   Medication Sig    acetaminophen  325 mg tablet Take 2 tablets (650 mg total) by mouth every six (6) hours as needed (mild pain).    aspirin  (ASPIRIN  LOW DOSE) 81 mg EC tablet Take 1 tablet (81 mg total) by mouth daily.    benzoyl peroxide -erythromycin  5-3% gel Apply to acne once daily.    Calcium  Carb-Cholecalciferol  500-10 MG-MCG CHEW Chew 1 tablet by mouth daily.    diclofenac  Sodium 1% gel Apply 2 g topically four (4) times daily as needed.    DILANTIN  100 MG ER capsule Take 4 capsules (400 mg total) by mouth at bedtime.    docusate 100 mg capsule Take 1 capsule (100 mg total) by mouth as needed for.    ibuprofen  600 mg tablet Take 1 tablet (600 mg total) by mouth daily as needed. RETIN-A  MICRO PUMP 0.06 % GEL APPLY A PEA SIZED AMOUNT TOPICALLY TO AFFECTED AREA OF FACE NIGHTLY    sulfacetamide -sulfur  9-4.5 % LIQD liquid Apply to face twice  daily     No facility-administered medications prior to visit.         SocHx:  No T/E/D use  Works in loss adjuster, chartered; single    FamHx:  There is no family history of autoimmune/rheumatic disease and psoriasis.    Physical Exam:    10/10/2024  BP 146/90  ~ Pulse 94  ~ Temp 36.6 ?C (97.9 ?F) (Tympanic)  ~ Resp 18  ~ Wt 219 lb (99.3 kg)  ~ SpO2 97%  ~ BMI 29.70 kg/m?     Pain Information       No pain information on file              NAD  No ocular injection  No alopecia  OP clear  No LAD  CTAB  RRR  S/NT/ND  No C/C/E  No rashes  No telangectasia/discoloration/calcinosis/sclerodactyly  No tender joints  No swollen joints  L knee crepitus+, small effusion, no warmth   No enthesitis  No dactylitis  No myofascial pain  Neuro grossly intact except R extremities atrophy  Peripheral pulses intact      Labs:  Lab Results   Component Value Date    WBC 4.56 07/31/2024    HGB 13.6 07/31/2024    HCT 41.0 07/31/2024    PLT 172 07/31/2024    NEUTABS 2.97 10/25/2023    LYMPHABS 2.04 10/25/2023     Lab Results   Component Value Date    GLUCOSE 101 (H) 07/31/2024    GFREST >89 04/18/2014    CREAT 0.55 (L) 07/31/2024    CALCIUM  9.0 07/31/2024    TOTPRO 7.4 07/31/2024    ALBUMIN 4.1 07/31/2024    AST 27 07/31/2024    ALT 29 07/31/2024    ALKPHOS 84 07/31/2024     @RRCOAGS @  Lab Results   Component Value Date    TSH 1.5 01/21/2017       Rheum labs:  Lab Results   Component Value Date    SRWEST 13 (H) 05/27/2017    SRWEST 15 (H) 11/28/2014     No results found for: ''CARDLIPIGA'', ''CARDLIPIGG'', ''CARDLIPIGM'', ''ANAAB'', ''ANAABTITER'', ''B2GLYPROIGA'', ''B2GLYPROIGG'', ''B2GLYPROIGM'', ''DRVVT'', ''RNPAB'', ''SMAB'', ''SSA''  No results found for: ''C3'', ''C4'', ''DSDNAAB'', ''NDNAABIFA''  No results found for: ''CANCA'', ''PANCA'', ''PROT3AB'', ''MPOAB''  Lab Results   Component Value Date    CKTOT 68 04/23/2008    URICACID 3.6 11/28/2014     Lab Results   Component Value Date    VITD25OH 35 10/09/2022    VITD25OH 37 09/30/2021     UA:   Lab Results   Component Value Date    PROTCLUR Negative 03/31/2024    BLDUR Negative 05/15/2024    LEUKESTUR Negative 03/31/2024    RBCSUR 3 03/31/2024    WBCSUR Negative 05/15/2024       Studies: [x] reviewed [] ordered [] independently reviewed.  Component      Latest Ref Rng & Units 09/16/2018   Segmented Neutrophil      No Reference Range % 1   Lymphocyte      No Reference Range % 1   Monocyte      No Reference Range % 98   Total Cells      cells 100   Fluid Appearance       Viscoid . . .   RBC Count,Joint Fl      No Reference Range /cmm <1,000   WBC Count,Joint Fl      No Reference Range /cmm 282  Crystals,Fluid #1      No Crystals Seen No Crystals Seen       Imaging:    08/07/24 XR Left Ankle  There is pes planus.  There is no fracture or dislocation.  There is no arthritis. There is no erosion.  There is no tibiotalar joint effusion.  The ankle mortise is intact.  There are extensive arterial calcifications--peripheral arterial disease.  There is generalized soft tissue swelling.  No arthritis or evidence of inflammatory arthropathy.     XR Knees B/L 11/2022:   R- IMPRESSION: The previously noted material was extrinsic about the right knee. There is mild right and moderate left knee osteoarthritis.  L - IMPRESSION:  1. Limited examination due to overlying material. Rectangular density projecting over the suprapatellar recess on the lateral view of the right knee may be external. The patient will be called back to the department for repeat frontal and lateral views   of the right knee.  2. There is progression of left knee osteoarthritis, now moderate.     Knees XR 08/2017   Mild osteoarthritis of the left knee.  No significant arthritis of the right knee.    Knees 2007 normal     ASSESSMENT AND PLAN  KENDRIX ORMAN is a 68 y.o. male     # Knees OA stable on prn ibuprofen  800 mg as needed; no GERD; pt takes rarely; recent slight worsening with effusion in L knee s/p CSI 2019- synovial fluid no crystals    12/30/2022 - pain in L knee at night and feels there is fluid - aspiration and CSI of L knee today XR Knees 11/2022 shows mild osteoarthritis of R knee and moderate osteoarthritis of L knee  5 cc fluid obtained no crystals non-inflammatory    02/07/2024 L knee CSI vital    08/07/2024 Left knee CSI today. Will start Physical Therapy at [facility].    10/10/2024 History of fall a few weeks ago. Patient complaining of tingling overlap knee. [facility] ED note reviewed: XR hands and left ankle no fracture. Recommendation do XR Bilateral Knees today for evaluation for injury (bilateral knee pain) ordered today (10/10/24).    # Injection history left knee  L knee CSI  04/19/2023, 08/09/2023, 02/07/2024, 08/07/24  L knee aspiration and CSI 12/2022   Bilateral CSI 09/2018    # Left Ankle Pain and Swelling ? OA  08/07/2024 Will complete XR Left Ankle to evaluate pain and swelling.  10/10/2024 Per patient's request DME order for soft ankle brace placed today.    # Hx of epilepsy stable     RECOMMENDATIONS  Discussion: History of fall a few weeks ago. Patient complaining of tingling overlap knee. [facility] ED note reviewed: XR hands and left ankle no fracture. Recommendation do XR Bilateral Knees today for evaluation for injury (bilateral knee pain) ordered today (10/10/24).    Per patient request, Durable Medical Equipment order for soft ankle brace placed today (10/10/24).    Reminder: Referral to [facility] for knee Ostearthritis ordered 08/07/24.    Follow up in person in 3 months (40 min) for knee injection.    Weight reduction  Keep minimum Ibuprofen  400 mg qday PRN-with food    Orders Placed This Encounter    XR knee ap+lat+sunrise standing bilat (3 views ea)      RTC 3 months or sooner as needed.  CC note to PCP/referred physician    Problem is:  [x]  Chronic unstable  []  Chronic  stable   []  New minor/self-limited problem  []  Acute uncomplicated illness/injury   []  Acute systemic illness or complicated injury   []  New problem with uncertain diagnosis  []  New problem that poses threat to life or bodily function    RISK OF COMPLICATION   This writer has deemed the above diagnoses to have a risk of complication, morbidity or mortality of:   []  Minimal  [x]  Low  []  Moderate  []  Severe     BILLING BY TIME   I spent  minutes for the items checked below on the day of service >50% spent in face to face evaluation and counseling the patient.  The above plan of care, diagnosis, orders, and follow-up were discussed with the patient. Questions related to this recommended plan of care were answered    [x]  Preparing to see the patient (e.g., review of tests) Reviewed and summarized old records.   [x]  Performing a medically appropriate examination and/or evaluation   [x]  Independently interpreting results and communicating results to patient/ family/caregiver  [x]  Counseling and educating the patient/family/caregiver   [x]  Ordering medications, tests, or procedures  [x]  Risks/benefits/toxicities of meds discussed.   [x]  Referring and communicating with other healthcare professionals (when not separately reported) -Discussed with other providers of complex medical conditions and management plans outlined above  [x]  Documenting clinical information in the EHR  []  Immunosuppression drugs changed.   []  Requested outside medical records.    Dayton Harrison MD Rheumatology Division, [place] [phone])    Author:  Dayton Harrison, M.D. [facility]  10/10/2024 4:52 PM    [facility]  [address]   [place], [place] [address]  [phone]  Fax [phone]      ATTESTATION     Scribe Signature:  LILLETTE Curtistine Cola, have scribed for Dayton Harrison, MD, MS on this encounter with CADON RACZKA on 10/10/2024 at 4:52 PM.     Physician Signature:  I have reviewed this note on this encounter with Dorn GORMAN Code on 10/10/2024 at 4:52 PM, scribed by Anthony Pastizzo. I have edited and attest that it is an accurate representation of my evaluation.    Dayton Harrison, MD, MS at 4:52 PM.

## 2024-10-10 ENCOUNTER — Inpatient Hospital Stay: Payer: PRIVATE HEALTH INSURANCE

## 2024-10-10 ENCOUNTER — Ambulatory Visit: Payer: MEDICAID

## 2024-10-10 DIAGNOSIS — G8929 Other chronic pain: Secondary | ICD-10-CM

## 2024-10-10 DIAGNOSIS — M25561 Pain in right knee: Secondary | ICD-10-CM

## 2024-10-10 DIAGNOSIS — M1710 Unilateral primary osteoarthritis, unspecified knee: Secondary | ICD-10-CM

## 2024-10-10 DIAGNOSIS — M25562 Pain in left knee: Secondary | ICD-10-CM

## 2024-10-10 DIAGNOSIS — M1712 Unilateral primary osteoarthritis, left knee: Principal | ICD-10-CM

## 2024-10-10 DIAGNOSIS — Z9181 History of falling: Secondary | ICD-10-CM

## 2024-10-10 DIAGNOSIS — S93409S Sprain of unspecified ligament of unspecified ankle, sequela: Secondary | ICD-10-CM

## 2024-11-14 ENCOUNTER — Ambulatory Visit: Payer: MEDICARE

## 2024-11-28 ENCOUNTER — Telehealth: Payer: BLUE CROSS/BLUE SHIELD

## 2024-11-28 NOTE — Telephone Encounter
 Appointment Accommodation Request      Appointment Type: Derm    Reason for sooner request: Patient calling stating he's experiencing redness on both feet, started around the toenail, stating he has a cut an the side of one of the toes. He's requesting an appt with Dr. Kaleen soon. Please advise, thank you.    Date/Time Requested (If any): appt soon    Last seen by MD: 09/23/2023    Any Symptoms:  [x]  Yes  []  No      Symptom: Toenail Symptoms  Outcome: Continue with Caller's Request  Reason: Caller denied all higher acuity questions    If yes, what symptoms are you experiencing:   Duration of symptoms (how long):     Patient or caller was offered an appointment but declined.    Patient or caller was advised to seek emergency services if conditions are urgent or emergent.    Patient or caller has been notified of the turnaround time of 1-2 business (days).

## 2024-11-29 NOTE — Telephone Encounter
"  Scheduled with Kristi  "
